# Patient Record
Sex: Female | Born: 1940 | Race: White | Hispanic: No | Smoking: Former smoker
Health system: Southern US, Community
[De-identification: ages and names within clinical notes are randomized; demographics above are authoritative.]

## PROBLEM LIST (undated history)

## (undated) DIAGNOSIS — I2510 Atherosclerotic heart disease of native coronary artery without angina pectoris: Secondary | ICD-10-CM

## (undated) DIAGNOSIS — I249 Acute ischemic heart disease, unspecified: Secondary | ICD-10-CM

## (undated) DIAGNOSIS — B029 Zoster without complications: Secondary | ICD-10-CM

## (undated) DIAGNOSIS — I255 Ischemic cardiomyopathy: Secondary | ICD-10-CM

## (undated) DIAGNOSIS — H269 Unspecified cataract: Secondary | ICD-10-CM

## (undated) DIAGNOSIS — C4490 Unspecified malignant neoplasm of skin, unspecified: Secondary | ICD-10-CM

## (undated) DIAGNOSIS — I10 Essential (primary) hypertension: Secondary | ICD-10-CM

## (undated) DIAGNOSIS — I219 Acute myocardial infarction, unspecified: Secondary | ICD-10-CM

## (undated) DIAGNOSIS — I739 Peripheral vascular disease, unspecified: Secondary | ICD-10-CM

## (undated) DIAGNOSIS — I4891 Unspecified atrial fibrillation: Secondary | ICD-10-CM

## (undated) DIAGNOSIS — E785 Hyperlipidemia, unspecified: Secondary | ICD-10-CM

## (undated) HISTORY — DX: Zoster without complications: B02.9

## (undated) HISTORY — PX: VAGINAL HYSTERECTOMY: SUR661

## (undated) HISTORY — PX: ABDOMINAL HYSTERECTOMY: SHX81

## (undated) HISTORY — DX: Essential (primary) hypertension: I10

## (undated) HISTORY — DX: Unspecified cataract: H26.9

## (undated) HISTORY — PX: CATARACT EXTRACTION, BILATERAL: SHX1313

## (undated) HISTORY — DX: Hyperlipidemia, unspecified: E78.5

## (undated) HISTORY — DX: Acute myocardial infarction, unspecified: I21.9

## (undated) HISTORY — DX: Unspecified malignant neoplasm of skin, unspecified: C44.90

---
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
## 1998-02-03 DIAGNOSIS — B029 Zoster without complications: Secondary | ICD-10-CM

## 1998-02-03 HISTORY — DX: Zoster without complications: B02.9

## 1998-06-07 ENCOUNTER — Ambulatory Visit (HOSPITAL_COMMUNITY): Admission: RE | Admit: 1998-06-07 | Discharge: 1998-06-07 | Payer: Self-pay | Admitting: Obstetrics & Gynecology

## 2003-09-18 ENCOUNTER — Encounter: Admission: RE | Admit: 2003-09-18 | Discharge: 2003-09-18 | Payer: Self-pay | Admitting: Sports Medicine

## 2003-10-31 ENCOUNTER — Ambulatory Visit: Payer: Self-pay | Admitting: Family Medicine

## 2003-11-02 ENCOUNTER — Ambulatory Visit: Payer: Self-pay | Admitting: Sports Medicine

## 2003-12-01 ENCOUNTER — Ambulatory Visit: Payer: Self-pay | Admitting: Sports Medicine

## 2003-12-11 ENCOUNTER — Encounter: Admission: RE | Admit: 2003-12-11 | Discharge: 2003-12-11 | Payer: Self-pay | Admitting: Sports Medicine

## 2004-01-16 ENCOUNTER — Ambulatory Visit: Payer: Self-pay | Admitting: Sports Medicine

## 2004-02-15 ENCOUNTER — Ambulatory Visit: Payer: Self-pay | Admitting: Family Medicine

## 2004-04-01 ENCOUNTER — Ambulatory Visit: Payer: Self-pay | Admitting: Sports Medicine

## 2004-07-11 ENCOUNTER — Ambulatory Visit: Payer: Self-pay | Admitting: Sports Medicine

## 2005-04-17 ENCOUNTER — Ambulatory Visit: Payer: Self-pay | Admitting: Family Medicine

## 2005-05-13 ENCOUNTER — Ambulatory Visit: Payer: Self-pay | Admitting: Sports Medicine

## 2005-05-26 ENCOUNTER — Encounter: Admission: RE | Admit: 2005-05-26 | Discharge: 2005-05-26 | Payer: Self-pay | Admitting: Sports Medicine

## 2005-06-23 ENCOUNTER — Encounter: Admission: RE | Admit: 2005-06-23 | Discharge: 2005-06-23 | Payer: Self-pay | Admitting: Family Medicine

## 2005-10-31 ENCOUNTER — Ambulatory Visit: Payer: Self-pay | Admitting: Family Medicine

## 2006-04-02 DIAGNOSIS — I10 Essential (primary) hypertension: Secondary | ICD-10-CM | POA: Insufficient documentation

## 2006-04-02 DIAGNOSIS — E669 Obesity, unspecified: Secondary | ICD-10-CM | POA: Insufficient documentation

## 2006-04-02 DIAGNOSIS — E785 Hyperlipidemia, unspecified: Secondary | ICD-10-CM | POA: Insufficient documentation

## 2006-05-12 ENCOUNTER — Telehealth (INDEPENDENT_AMBULATORY_CARE_PROVIDER_SITE_OTHER): Payer: Self-pay | Admitting: *Deleted

## 2006-07-24 ENCOUNTER — Encounter: Admission: RE | Admit: 2006-07-24 | Discharge: 2006-07-24 | Payer: Self-pay | Admitting: Sports Medicine

## 2006-07-24 ENCOUNTER — Encounter (INDEPENDENT_AMBULATORY_CARE_PROVIDER_SITE_OTHER): Payer: Self-pay | Admitting: Family Medicine

## 2006-08-28 ENCOUNTER — Encounter (INDEPENDENT_AMBULATORY_CARE_PROVIDER_SITE_OTHER): Payer: Self-pay | Admitting: Family Medicine

## 2006-08-28 ENCOUNTER — Ambulatory Visit: Payer: Self-pay | Admitting: Family Medicine

## 2006-08-28 LAB — CONVERTED CEMR LAB
ALT: 14 units/L (ref 0–35)
AST: 17 units/L (ref 0–37)
Albumin: 4.2 g/dL (ref 3.5–5.2)
Alkaline Phosphatase: 78 units/L (ref 39–117)
BUN: 22 mg/dL (ref 6–23)
Basophils Absolute: 0 10*3/uL (ref 0.0–0.1)
Basophils Relative: 1 % (ref 0–1)
CO2: 28 meq/L (ref 19–32)
Calcium: 9.5 mg/dL (ref 8.4–10.5)
Chloride: 101 meq/L (ref 96–112)
Creatinine, Ser: 0.79 mg/dL (ref 0.40–1.20)
Direct LDL: 139 mg/dL — ABNORMAL HIGH
Eosinophils Absolute: 0.1 10*3/uL (ref 0.0–0.7)
Eosinophils Relative: 2 % (ref 0–5)
Glucose, Bld: 82 mg/dL (ref 70–99)
HCT: 41.3 % (ref 36.0–46.0)
Hemoglobin: 12.9 g/dL (ref 12.0–15.0)
Lymphocytes Relative: 25 % (ref 12–46)
Lymphs Abs: 1.6 10*3/uL (ref 0.7–3.3)
MCHC: 31.2 g/dL (ref 30.0–36.0)
MCV: 93.2 fL (ref 78.0–100.0)
Monocytes Absolute: 0.6 10*3/uL (ref 0.2–0.7)
Monocytes Relative: 9 % (ref 3–11)
Neutro Abs: 4.2 10*3/uL (ref 1.7–7.7)
Neutrophils Relative %: 64 % (ref 43–77)
Platelets: 316 10*3/uL (ref 150–400)
Potassium: 4.5 meq/L (ref 3.5–5.3)
RBC: 4.43 M/uL (ref 3.87–5.11)
RDW: 14 % (ref 11.5–14.0)
Sodium: 140 meq/L (ref 135–145)
TSH: 1.174 microintl units/mL (ref 0.350–5.50)
Total Bilirubin: 0.5 mg/dL (ref 0.3–1.2)
Total Protein: 7.6 g/dL (ref 6.0–8.3)
WBC: 6.6 10*3/uL (ref 4.0–10.5)

## 2006-08-31 ENCOUNTER — Encounter (INDEPENDENT_AMBULATORY_CARE_PROVIDER_SITE_OTHER): Payer: Self-pay | Admitting: Family Medicine

## 2007-04-13 ENCOUNTER — Telehealth: Payer: Self-pay | Admitting: *Deleted

## 2007-04-20 ENCOUNTER — Telehealth: Payer: Self-pay | Admitting: *Deleted

## 2007-04-22 ENCOUNTER — Ambulatory Visit: Payer: Self-pay | Admitting: Family Medicine

## 2007-04-22 ENCOUNTER — Encounter: Payer: Self-pay | Admitting: Family Medicine

## 2007-06-09 ENCOUNTER — Ambulatory Visit: Payer: Self-pay | Admitting: Family Medicine

## 2007-06-11 ENCOUNTER — Encounter (INDEPENDENT_AMBULATORY_CARE_PROVIDER_SITE_OTHER): Payer: Self-pay | Admitting: Family Medicine

## 2007-06-12 ENCOUNTER — Encounter (INDEPENDENT_AMBULATORY_CARE_PROVIDER_SITE_OTHER): Payer: Self-pay | Admitting: Family Medicine

## 2007-07-08 ENCOUNTER — Ambulatory Visit: Payer: Self-pay | Admitting: Family Medicine

## 2007-07-08 DIAGNOSIS — M159 Polyosteoarthritis, unspecified: Secondary | ICD-10-CM | POA: Insufficient documentation

## 2007-07-16 ENCOUNTER — Ambulatory Visit: Payer: Self-pay | Admitting: Gastroenterology

## 2007-07-26 ENCOUNTER — Encounter: Admission: RE | Admit: 2007-07-26 | Discharge: 2007-07-26 | Payer: Self-pay | Admitting: Family Medicine

## 2007-08-10 ENCOUNTER — Ambulatory Visit: Payer: Self-pay | Admitting: Gastroenterology

## 2007-09-28 ENCOUNTER — Telehealth: Payer: Self-pay | Admitting: *Deleted

## 2007-09-29 ENCOUNTER — Ambulatory Visit: Payer: Self-pay | Admitting: Family Medicine

## 2007-12-21 ENCOUNTER — Ambulatory Visit: Payer: Self-pay | Admitting: Family Medicine

## 2007-12-22 ENCOUNTER — Encounter: Payer: Self-pay | Admitting: Family Medicine

## 2008-01-13 ENCOUNTER — Telehealth: Payer: Self-pay | Admitting: *Deleted

## 2008-08-17 ENCOUNTER — Encounter: Payer: Self-pay | Admitting: Family Medicine

## 2008-08-17 ENCOUNTER — Encounter: Admission: RE | Admit: 2008-08-17 | Discharge: 2008-08-17 | Payer: Self-pay | Admitting: Family Medicine

## 2008-09-13 ENCOUNTER — Encounter: Payer: Self-pay | Admitting: Family Medicine

## 2008-09-13 ENCOUNTER — Ambulatory Visit: Payer: Self-pay | Admitting: Family Medicine

## 2008-09-21 ENCOUNTER — Encounter: Payer: Self-pay | Admitting: Family Medicine

## 2008-09-22 LAB — CONVERTED CEMR LAB
ALT: 16 units/L (ref 0–35)
AST: 17 units/L (ref 0–37)
Albumin: 4.5 g/dL (ref 3.5–5.2)
Alkaline Phosphatase: 71 units/L (ref 39–117)
BUN: 16 mg/dL (ref 6–23)
CO2: 25 meq/L (ref 19–32)
Calcium: 9.5 mg/dL (ref 8.4–10.5)
Chloride: 104 meq/L (ref 96–112)
Cholesterol: 214 mg/dL — ABNORMAL HIGH (ref 0–200)
Creatinine, Ser: 0.84 mg/dL (ref 0.40–1.20)
Glucose, Bld: 93 mg/dL (ref 70–99)
HDL: 45 mg/dL (ref >39)
LDL Cholesterol: 148 mg/dL — ABNORMAL HIGH (ref 0–99)
Potassium: 4.3 meq/L (ref 3.5–5.3)
Sodium: 141 meq/L (ref 135–145)
Total Bilirubin: 0.5 mg/dL (ref 0.3–1.2)
Total CHOL/HDL Ratio: 4.8
Total Protein: 7.6 g/dL (ref 6.0–8.3)
Triglycerides: 104 mg/dL (ref <150)
VLDL: 21 mg/dL (ref 0–40)

## 2010-02-24 ENCOUNTER — Encounter: Payer: Self-pay | Admitting: Family Medicine

## 2010-03-06 ENCOUNTER — Encounter: Payer: Self-pay | Admitting: *Deleted

## 2010-09-05 ENCOUNTER — Ambulatory Visit (INDEPENDENT_AMBULATORY_CARE_PROVIDER_SITE_OTHER): Payer: PRIVATE HEALTH INSURANCE | Admitting: Family Medicine

## 2010-09-05 ENCOUNTER — Encounter: Payer: Self-pay | Admitting: Family Medicine

## 2010-09-05 VITALS — BP 154/88 | HR 63 | Temp 98.2°F | Ht 63.5 in | Wt 164.1 lb

## 2010-09-05 DIAGNOSIS — Z0000 Encounter for general adult medical examination without abnormal findings: Secondary | ICD-10-CM

## 2010-09-05 DIAGNOSIS — Z23 Encounter for immunization: Secondary | ICD-10-CM

## 2010-09-05 DIAGNOSIS — E785 Hyperlipidemia, unspecified: Secondary | ICD-10-CM

## 2010-09-05 DIAGNOSIS — I10 Essential (primary) hypertension: Secondary | ICD-10-CM

## 2010-09-05 LAB — COMPREHENSIVE METABOLIC PANEL
ALT: 13 U/L (ref 0–35)
AST: 18 U/L (ref 0–37)
Albumin: 4.2 g/dL (ref 3.5–5.2)
Alkaline Phosphatase: 66 U/L (ref 39–117)
BUN: 17 mg/dL (ref 6–23)
CO2: 27 mEq/L (ref 19–32)
Calcium: 9.4 mg/dL (ref 8.4–10.5)
Chloride: 104 mEq/L (ref 96–112)
Creat: 0.79 mg/dL (ref 0.50–1.10)
Glucose, Bld: 91 mg/dL (ref 70–99)
Potassium: 4.4 mEq/L (ref 3.5–5.3)
Sodium: 139 mEq/L (ref 135–145)
Total Bilirubin: 0.5 mg/dL (ref 0.3–1.2)
Total Protein: 7.6 g/dL (ref 6.0–8.3)

## 2010-09-05 LAB — LIPID PANEL
Cholesterol: 207 mg/dL — ABNORMAL HIGH (ref 0–200)
HDL: 41 mg/dL (ref >39)
LDL Cholesterol: 145 mg/dL — ABNORMAL HIGH (ref 0–99)
Total CHOL/HDL Ratio: 5 Ratio
Triglycerides: 107 mg/dL (ref <150)
VLDL: 21 mg/dL (ref 0–40)

## 2010-09-05 MED ORDER — ZOSTER VACCINE LIVE 19400 UNT/0.65ML ~~LOC~~ SOLR: 0.6500 mL | Freq: Once | SUBCUTANEOUS | Status: DC

## 2010-09-05 MED ORDER — PNEUMOCOCCAL VAC POLYVALENT 25 MCG/0.5ML IJ INJ
0.5000 mL | INJECTION | Freq: Once | INTRAMUSCULAR | Status: AC
  Administered 2010-09-05: 0.5 mL via INTRAMUSCULAR

## 2010-09-05 NOTE — Assessment & Plan Note (Signed)
Patient states that BP's at home are generally within range.  Has history of some very elevated BP's in the office in the past.  Patient does not want to take medications.  I asked her to return if blood pressures remain elevated at home, I gave her a handout on controlling BP without medications.

## 2010-09-05 NOTE — Progress Notes (Signed)
  Subjective:    Patient ID: Melissa Gibbs, female    DOB: [birth year], 70 y.o.   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 Wt Readings from Last 3 Encounters:  09/05/10 164 lb 1.6 oz (74.435 kg)  09/13/08 165 lb (74.844 kg)  12/21/07 167 lb (75.751 kg)   ROS:  Denies RUQ pain, myalgias, or symptoms or coronary ischemia Lab Results  Component Value Date   LDLCALC 148* 09/13/2008   Lab Results  Component Value Date   CHOL 214* 09/13/2008   Lab Results  Component Value Date   HDL 45 09/13/2008   Lab Results  Component Value Date   TRIG 104 09/13/2008   Lab Results  Component Value Date   ALT 16 09/13/2008   AST 17 09/13/2008   ALKPHOS 71 09/13/2008   BILITOT 0.5 09/13/2008   HYPERTENSION  BP Readings from Last 3 Encounters:  09/05/10 154/88  09/13/08 174/84  12/21/07 137/85    Hypertension ROS: taking medications as instructed, no medication side effects noted, home BP monitoring in range of 120-130's systolic over 70's's diastolic, no TIA's, no chest pain on exertion, no dyspnea on exertion, no swelling of ankles and no intermittent claudication symptoms.   She wants to discuss prevention such a dexa and xostavax   Gen:  No fever, chills, unexplained weight loss Ears:  No hearing loss, ringing Eyes: No vision changes, double vision, eye drainage Nose:  No rhinorrhea, congestion Throat:  No sore throat or dysphagia CV:  No chest pain, palpitations, PND, dyspnea on exertion, or edema Resp: No cough, dyspnea, wheezing Abd: No nausea, vomting, diarrhea, constipation, or change in bowel color, size, or caliber. MSK: no joint pain, myalgias SKIN: no rash, changing moles GU: No dysuria, hematuria, vaginal discharge    Review of Systems     Objective:   Physical Exam GEN: Alert & Oriented, No acute distress HEENT:  EOMI, PERRLA, no conjunctival injection or scleral  icterus.   Nares without edema or rhinorrhea.  Oropharynx is without erythema or exudates.  No anterior or posterior cervical lymphadenopathy.  No goiter. CV:  Regular Rate & Rhythm, no murmur Respiratory:  Normal work of breathing, CTAB Abd:  + BS, soft, no tenderness to palpation Ext: no pre-tibial edema         Assessment & Plan:

## 2010-09-05 NOTE — Assessment & Plan Note (Signed)
Screening up to date except for mammogram- she will schedule this on her own.  Got pneumovax today.  Given rx for zostavax.  Counseled does not need dexa as she states previously has normal screening and no risk factors.  Advised calcium +D and weight bearing exercises.  Advanced directive noted in social history.  DNI/DNR

## 2010-09-05 NOTE — Patient Instructions (Addendum)
If you notice your blood pressure is over 140/90, please let us know so we can discuss blood pressure. See tips on exercise and nutrition for blood pressure. Schedule mammogram Schedule dermatologist appointment for skin check See info on shingles vaccine Exercise and Calcium and Vitamin D for bone health You got you pneumonia vaccine today

## 2010-09-05 NOTE — Assessment & Plan Note (Signed)
Patient does not want to take statin.  She takes some fish oil.  She would like to check lipids and if very elevated may consider tx.

## 2010-09-06 ENCOUNTER — Encounter: Payer: Self-pay | Admitting: Family Medicine

## 2010-10-18 ENCOUNTER — Other Ambulatory Visit: Payer: Self-pay | Admitting: Family Medicine

## 2010-10-18 DIAGNOSIS — Z1231 Encounter for screening mammogram for malignant neoplasm of breast: Secondary | ICD-10-CM

## 2010-10-30 ENCOUNTER — Ambulatory Visit
Admission: RE | Admit: 2010-10-30 | Discharge: 2010-10-30 | Disposition: A | Discharge status: Home / Self Care | Payer: Medicare Other | Source: Ambulatory Visit | Attending: Family Medicine | Admitting: Family Medicine

## 2010-10-30 DIAGNOSIS — Z1231 Encounter for screening mammogram for malignant neoplasm of breast: Secondary | ICD-10-CM

## 2011-10-15 ENCOUNTER — Encounter: Payer: Self-pay | Admitting: Family Medicine

## 2011-10-15 ENCOUNTER — Ambulatory Visit (INDEPENDENT_AMBULATORY_CARE_PROVIDER_SITE_OTHER): Payer: Medicare Other | Admitting: Family Medicine

## 2011-10-15 VITALS — BP 151/88 | HR 81 | Temp 98.1°F | Ht 63.5 in | Wt 162.0 lb

## 2011-10-15 DIAGNOSIS — N390 Urinary tract infection, site not specified: Secondary | ICD-10-CM | POA: Insufficient documentation

## 2011-10-15 DIAGNOSIS — E785 Hyperlipidemia, unspecified: Secondary | ICD-10-CM

## 2011-10-15 DIAGNOSIS — R300 Dysuria: Secondary | ICD-10-CM

## 2011-10-15 LAB — POCT URINALYSIS DIPSTICK
Bilirubin, UA: NEGATIVE
Ketones, UA: NEGATIVE
Protein, UA: 100
Spec Grav, UA: 1.02
Urobilinogen, UA: 0.2
pH, UA: 5.5

## 2011-10-15 LAB — POCT UA - MICROSCOPIC ONLY

## 2011-10-15 MED ORDER — CEPHALEXIN 500 MG PO CAPS: 500.0000 mg | ORAL_CAPSULE | Freq: Two times a day (BID) | ORAL | Status: AC

## 2011-10-15 MED ORDER — PROBIOTIC PO CAPS: 1.0000 | ORAL_CAPSULE | Freq: Every day | ORAL | Status: DC

## 2011-10-15 NOTE — Patient Instructions (Signed)
You have a urinary tract infection -Drink plenty of water (4-6 cups daily) -Take the antibiotic for 7 days -Take the probiotic with the antibiotic or yogurt daily to prevent diarrhea

## 2011-10-15 NOTE — Assessment & Plan Note (Signed)
Symptoms and urinalysis consistent with UTI -Keflex x 7 days -She has a history of significant diarrhea with amoxicillin. Advised she eat yogurt and/or take probiotic to prevent diarrhea -Indications given to RTC

## 2011-10-15 NOTE — Progress Notes (Signed)
  Subjective:    Patient ID: Melissa Gibbs, female    DOB: 03-06-1940, 71 y.o.   MRN: [id number]  HPI # Dysuria for the past week She thinks she my have a UTI. Her last one was several years ago.  She is also have frequency and urgency and may be having some back pain.   She denies fevers/chills/nausea.  She is not sexually active.   Review of Systems Per HPI  Allergies, medication, past medical history reviewed.  Significant for: -Hyperlipidemia--she is not taking a statin. She is requesting your labs be checked today so that they will be ready when she comes back for her physical.  -HTN    Objective:   Physical Exam GEN: NAD; well-nourished, -appearing PSYCH: pleasant PULM: NI WOB BACK: no CVA tenderness ABD: NABS, soft, mild tenderness lower abdomen without guarding or rebound, ND    Assessment & Plan:

## 2011-10-22 ENCOUNTER — Other Ambulatory Visit: Payer: Medicare Other

## 2011-10-22 DIAGNOSIS — E785 Hyperlipidemia, unspecified: Secondary | ICD-10-CM

## 2011-10-22 LAB — LIPID PANEL
Cholesterol: 190 mg/dL (ref 0–200)
HDL: 45 mg/dL (ref >39)
Total CHOL/HDL Ratio: 4.2 Ratio
VLDL: 17 mg/dL (ref 0–40)

## 2011-10-22 NOTE — Progress Notes (Signed)
FLP DONE TODAY Gerome Kokesh 

## 2011-10-23 ENCOUNTER — Encounter: Payer: Self-pay | Admitting: Family Medicine

## 2011-10-30 ENCOUNTER — Encounter: Payer: Self-pay | Admitting: Family Medicine

## 2011-10-30 ENCOUNTER — Ambulatory Visit (INDEPENDENT_AMBULATORY_CARE_PROVIDER_SITE_OTHER): Payer: Medicare Other | Admitting: Family Medicine

## 2011-10-30 VITALS — BP 157/78 | HR 73 | Temp 98.1°F | Ht 63.5 in | Wt 163.0 lb

## 2011-10-30 DIAGNOSIS — E785 Hyperlipidemia, unspecified: Secondary | ICD-10-CM

## 2011-10-30 DIAGNOSIS — I10 Essential (primary) hypertension: Secondary | ICD-10-CM

## 2011-10-30 NOTE — Patient Instructions (Addendum)
Youre doing very well  Continue to work on getting some activity and increasing health fruits and veggies in your diet  Follow-up yearly  If you notice your blood pressure runs above 140/90 regularly, let me know

## 2011-10-30 NOTE — Progress Notes (Signed)
  Subjective:    Patient ID: Melissa Gibbs, female    DOB: 10/05/1940, 71 y.o.   MRN: [id number]  HPI  HYPERTENSION  BP Readings from Last 3 Encounters:  10/30/11 157/78  10/15/11 151/88  09/05/10 154/88    Hypertension ROS: taking medications as instructed, no medication side effects noted, home BP monitoring in range of 120-130's systolic over 70's diastolic, no TIA's, no chest pain on exertion, no dyspnea on exertion, no swelling of ankles and no intermittent claudication symptoms.   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 Wt Readings from Last 3 Encounters:  10/30/11 163 lb (73.936 kg)  10/15/11 162 lb (73.483 kg)  09/05/10 164 lb 1.6 oz (74.435 kg)   ROS:  Denies RUQ pain, myalgias, or symptoms or coronary ischemia Lab Results  Component Value Date   LDLCALC 128* 10/22/2011   Lab Results  Component Value Date   CHOL 190 10/22/2011   CHOL 207* 09/05/2010   CHOL 214* 09/13/2008   Lab Results  Component Value Date   HDL 45 10/22/2011   HDL 41 0/09/6576   HDL 45 09/13/2008   Lab Results  Component Value Date   TRIG 86 10/22/2011   TRIG 107 09/05/2010   TRIG 104 09/13/2008   Lab Results  Component Value Date   ALT 13 09/05/2010   AST 18 09/05/2010   ALKPHOS 66 09/05/2010   BILITOT 0.5 09/05/2010         Review of Systemssee HPI     Objective:   Physical ExamGEN: Alert & Oriented, No acute distress CV:  Regular Rate & Rhythm, no murmur Respiratory:  Normal work of breathing, CTAB Abd:  + BS, soft, no tenderness to palpation Ext: no pre-tibial edema         Assessment & Plan:

## 2011-10-30 NOTE — Assessment & Plan Note (Signed)
Well controlled by home readings, off meds.  Will continue to monitor, will let me know if readings become elevated.

## 2011-10-30 NOTE — Assessment & Plan Note (Signed)
LDL improved since last year, no meds.  Continue to work on lifestyle modificaiton

## 2012-02-04 HISTORY — PX: SKIN CANCER EXCISION: SHX779

## 2012-07-14 ENCOUNTER — Encounter: Payer: Self-pay | Admitting: Gastroenterology

## 2013-05-20 ENCOUNTER — Ambulatory Visit (INDEPENDENT_AMBULATORY_CARE_PROVIDER_SITE_OTHER): Payer: Commercial Managed Care - HMO | Admitting: Family Medicine

## 2013-05-20 ENCOUNTER — Encounter: Payer: Self-pay | Admitting: Family Medicine

## 2013-05-20 VITALS — BP 145/88 | HR 73 | Temp 98.5°F | Wt 167.0 lb

## 2013-05-20 DIAGNOSIS — E785 Hyperlipidemia, unspecified: Secondary | ICD-10-CM

## 2013-05-20 DIAGNOSIS — L821 Other seborrheic keratosis: Secondary | ICD-10-CM | POA: Insufficient documentation

## 2013-05-20 DIAGNOSIS — IMO0002 Reserved for concepts with insufficient information to code with codable children: Secondary | ICD-10-CM

## 2013-05-20 DIAGNOSIS — Z85828 Personal history of other malignant neoplasm of skin: Secondary | ICD-10-CM

## 2013-05-20 DIAGNOSIS — E663 Overweight: Secondary | ICD-10-CM

## 2013-05-20 DIAGNOSIS — IMO0001 Reserved for inherently not codable concepts without codable children: Secondary | ICD-10-CM

## 2013-05-20 DIAGNOSIS — N8111 Cystocele, midline: Secondary | ICD-10-CM

## 2013-05-20 DIAGNOSIS — I10 Essential (primary) hypertension: Secondary | ICD-10-CM

## 2013-05-20 DIAGNOSIS — R350 Frequency of micturition: Secondary | ICD-10-CM

## 2013-05-20 LAB — POCT URINALYSIS DIPSTICK
Bilirubin, UA: NEGATIVE
GLUCOSE UA: NEGATIVE
Ketones, UA: NEGATIVE
NITRITE UA: POSITIVE
Protein, UA: NEGATIVE
Spec Grav, UA: 1.025
UROBILINOGEN UA: 0.2
pH, UA: 5.5

## 2013-05-20 MED ORDER — ZOSTER VACCINE LIVE 19400 UNT/0.65ML ~~LOC~~ SOLR: 0.6500 mL | Freq: Once | SUBCUTANEOUS | Status: DC

## 2013-05-20 NOTE — Assessment & Plan Note (Signed)
discussed weight. Overweight better than underweight in elderly. Goal of 5 # weight loss in hopes of improving cystocele and symptoms.

## 2013-05-20 NOTE — Assessment & Plan Note (Signed)
Repeat lipid panel today. 

## 2013-05-20 NOTE — Assessment & Plan Note (Signed)
A: R UE SK x 2. Concerning to patient. P:  Referral to derm. Current derm, [facility], out of network and cost too much. Patient will determine in network derm and call us with preference.

## 2013-05-20 NOTE — Patient Instructions (Addendum)
Ms. Dobies,  Thank you for coming in today.  To remain healthy I do recommend regular exercise like walking for 20-30 minutes at least 4 days per week.   Please have repeat mammogram, colonoscopy [facility] at your earliest convenience. Please have zostavax.  Your bladder issue is related to your cystocele. A pouch that causes incomplete emptying.  For treatment: 1. Regular bathroom trips- try not to hold it.  2. Pessary-a ring that can be placed for gynecology to hold back cystocele. 3. Surgery.  Please read in formation below. If you are interested in seeing a gynecologist to discuss pessary or surgery I will be happy to refer you.  Plan to f/u in one year for repeat physical. I will be in touch with lab results.  Dr. Adrian Blackwater   Patient information: Pelvic organ prolapse (The Basics) View in Spanish Written by the doctors and editors at [employer] What is pelvic organ prolapse? - Pelvic organ prolapse is a condition that only affects women. It happens when tissues that support the organs in the lower belly relax. As a result, the organs drop down and press against or bulge into the vagina.  If the bladder bulges into the vagina, doctors call this problem "cystocele." If the rectum bulges into the vagina, they call it "rectocele." Uterine prolapse means the uterus has bulged into the vagina (figure 1).  What are the symptoms of pelvic organ prolapse? - Many women with this problem have no symptoms. But some women with pelvic organ prolapse have symptoms that include:  ?Fullness or pressure in the pelvis or vagina ?A bulge in the vagina or coming out of the vagina ?Leaking urine when they laugh, cough, or sneeze ?Needing to urinate all of a sudden When they use the toilet, some women need to press on the bulge in the vagina with a finger to get out all their urine or to finish a bowel movement.  Is there a test for pelvic organ prolapse? - Your doctor or nurse will be able to tell if  you have it by doing a pelvic exam.    Is there anything I can do on my own to feel better? - Yes. Some women feel better if they do pelvic muscle exercises. These exercises strengthen the muscles that control the flow of urine and bowel movements. They are also known as "Kegel" exercises. Your nurse or doctor can teach you how to do them.  How is pelvic organ prolapse treated? - Women who have no symptoms or who are not bothered by their symptoms do not need treatment. For women with symptoms that bother them, doctors suggest different treatments, including:  ?A vaginal pessary - This device fits inside a woman's vagina to support the bladder and push it back into place. Pessaries come in different shapes and sizes. ?Surgery - A surgeon can move dropped organs back where they belong and strengthen the tissues that keep them in place. Women should have this type of surgery only if they are done having children. Can pelvic organ prolapse be prevented? - You can reduce your chances of pelvic organ prolapse if you:  ?Lose weight if you are overweight ?Get treated for constipation if you are constipated ?Avoid activities that require you to lift heavy things

## 2013-05-20 NOTE — Progress Notes (Signed)
   Subjective:    Patient ID: Melissa Gibbs, female    DOB: [birth year], 73 y.o.   MRN: [id number] CC: f/u physical  HPI 73 yo F presents for f/u visit for yearly health maintenance physical and to discuss the following concerns:  1. Elevated BP: Elevated during office visit. Patient reports always elevated in the office. She denies vision change, headache, chest pain, shortness of breath. She takes hypertensive. She is a nonsmoker. She has a low-salt diet.  2. Bladder concerns: 2 years of frequent urination, hesitancy, pelvic pressure. Patient denies dysuria. Patient is not sexually active. Denies vaginal discharge. She is status post abdominal hysterectomy for uterine fibroids. Her symptoms do not interfere her quality of life. She prefers not to take medications.   3. Skin concerns: Patient is seen yearly by dermatology for total body skin surveillance. She was last seen 04/27/2013 at [facility] dermatology. She has a history of skin malignancy status post biopsy. There are no suspicious lesions on her last surveillance exam. She's brought a copy of the report. She did have multiple seborrheic keratosis on her extremities. She's concerned about 2 particular areas on her right arm which are growing in size and associated with pruritus.   4 Health Maintenance:  Due for zostavax, mammogram, colonoscopy.   Soc Hx: chronic non smoker. Lives alone. Fam Hx: melanoma in sister and nephew Review of Systems As per HPI Postive for easy bruising.  Negative for HA, vision change, CP, SOB, weight loss, diarrhea, constipation, weakness, hair loss or thinning.     Objective:   Physical Exam BP 162/100  Pulse 73  Temp(Src) 98.5 F (36.9 C) (Oral)  Wt 167 lb (75.751 kg) BP Readings from Last 3 Encounters:  05/20/13 145/88  10/30/11 157/78  10/15/11 151/88  General appearance: alert, cooperative, appears stated age and no distress Head: Normocephalic, without obvious abnormality, atraumatic Eyes:  conjunctivae/corneas clear. PERRL, EOM's intact. . Ears: normal TM's and external ear canals both ears Nose: no discharge, turbinates pale, swollen L>R Throat: lips, mucosa, and tongue normal; teeth and gums normal Neck: no adenopathy, no carotid bruit, no JVD, supple, symmetrical, trachea midline and thyroid not enlarged, symmetric, no tenderness/mass/nodules Back: symmetric, no curvature. ROM normal. No CVA tenderness. Lungs: clear to auscultation bilaterally Heart: regular rate and rhythm, S1, S2 normal, no murmur, click, rub or gallop Abdomen: soft, non-tender; bowel sounds normal; no masses,  no organomegaly Pelvic: positive findings: cystocele grade 2, mild urethral opening erythema, nontender, no lesions. External hemorrhoids 4-5 piles, non tender, speculum exam reveals absences cervix. Normal vaginal mucosa w/o atrophy.  Bimanual: uterus and cervix surgically absent. No adenxal mass or tenderness.  Extremities: extremities normal, atraumatic, no cyanosis or edema Pulses: 2+ and symmetric Skin: Skin color, texture, turgor normal. No rashes or lesions or multiple raised, stuck on macules on arms and legs. two areas of concern for patient on R arm: 7x9 mm and R posterior shoulder 15 x 7 mm both with stuck on appearance of SK. The R arm lesion is speckled with hyperpigmented spots.      Assessment & Plan:

## 2013-05-20 NOTE — Assessment & Plan Note (Signed)
A: initial BP elevated 162/100 repeat at goal for age. P: Check CMP and lipids Monitor q visit. Consider ambulatory BP monitoring

## 2013-05-20 NOTE — Assessment & Plan Note (Signed)
A: symptomatic cystocele P: Information provided Kegels, weight loss, avoid heavy lifting Patient will consider options and let me know if she would like gyn referral for pessary/surgery.

## 2013-05-21 LAB — COMPREHENSIVE METABOLIC PANEL
ALT: 15 U/L (ref 0–35)
AST: 19 U/L (ref 0–37)
Albumin: 4.4 g/dL (ref 3.5–5.2)
Alkaline Phosphatase: 78 U/L (ref 39–117)
BUN: 21 mg/dL (ref 6–23)
CO2: 27 mEq/L (ref 19–32)
Calcium: 9.6 mg/dL (ref 8.4–10.5)
Chloride: 102 mEq/L (ref 96–112)
Creat: 0.83 mg/dL (ref 0.50–1.10)
Glucose, Bld: 84 mg/dL (ref 70–99)
POTASSIUM: 4.1 meq/L (ref 3.5–5.3)
Sodium: 140 mEq/L (ref 135–145)
Total Bilirubin: 0.4 mg/dL (ref 0.2–1.2)
Total Protein: 7.8 g/dL (ref 6.0–8.3)

## 2013-05-21 LAB — LIPID PANEL
Cholesterol: 209 mg/dL — ABNORMAL HIGH (ref 0–200)
HDL: 46 mg/dL (ref >39)
LDL CALC: 144 mg/dL — AB (ref 0–99)
Total CHOL/HDL Ratio: 4.5 Ratio
Triglycerides: 95 mg/dL (ref <150)
VLDL: 19 mg/dL (ref 0–40)

## 2013-05-24 ENCOUNTER — Encounter: Payer: Self-pay | Admitting: Family Medicine

## 2013-06-01 ENCOUNTER — Telehealth: Payer: Self-pay | Admitting: Family Medicine

## 2013-06-01 NOTE — Telephone Encounter (Signed)
Needs referral to dermatologist. Dr Allyn Kenner is the one insurance recognizes Please advise

## 2013-06-03 ENCOUNTER — Other Ambulatory Visit: Payer: Self-pay

## 2013-06-03 DIAGNOSIS — Z1231 Encounter for screening mammogram for malignant neoplasm of breast: Secondary | ICD-10-CM

## 2013-06-06 NOTE — Telephone Encounter (Signed)
Called again about referral to dermatologist Is very concerned about spots on body

## 2013-06-06 NOTE — Telephone Encounter (Signed)
Referral placed.  Will forward to [place]. Melissa Gibbs,CMA

## 2013-06-13 ENCOUNTER — Ambulatory Visit
Admission: RE | Admit: 2013-06-13 | Discharge: 2013-06-13 | Disposition: A | Discharge status: Home / Self Care | Payer: Commercial Managed Care - HMO | Source: Ambulatory Visit

## 2013-06-13 ENCOUNTER — Telehealth: Payer: Self-pay | Admitting: Family Medicine

## 2013-06-13 DIAGNOSIS — Z1231 Encounter for screening mammogram for malignant neoplasm of breast: Secondary | ICD-10-CM

## 2013-06-13 NOTE — Telephone Encounter (Signed)
LVM to pt   It is not dermatology that accept [facility] in [place] I will like to know if pt can go to [facility] to see a dermatology.  Here is how this work : I fax the referral to [facility]. After a faxed the referral,  I will contact pt with [facility] and phone number. Pt needs to call to set up appt by her self.  If Pt can't go to [facility] Pt needs to go to [place]

## 2013-06-13 NOTE — Telephone Encounter (Signed)
LMV to pt PLEASE CHECK TELEPHONE NOTE PREVIEW TO THIS.   After checked pt INS card PCP is not correct pt needs to call [employer] and change it.  Unable to process referrals.  [place]

## 2013-06-16 ENCOUNTER — Telehealth: Payer: Self-pay | Admitting: Family Medicine

## 2013-06-16 NOTE — Telephone Encounter (Signed)
LVM to pt   [employer] Ins has incorrect PCP unable to process referral. It should be one of OUR provider(faculty)  [place]

## 2013-06-16 NOTE — Telephone Encounter (Signed)
Patient calls, she received her new [employer] card TODAY with Dr. Verlon Au name one it. Will bring card up today to be scanned into EPIC. Also, her insurance states that Dr. Marguerite Olea takes [facility]. She also called his office and his receptionist states that the insurance is accepted but she will need a referral.  I have printed and left Dr. Juel Burrow info on [place]' desk. Please refer her to this office.

## 2013-06-16 NOTE — Telephone Encounter (Signed)
Noted  

## 2013-06-16 NOTE — Telephone Encounter (Signed)
Just an FYI.  Pt will need to contact insurance to have this changed. Jaquanda Wickersham,CMA

## 2013-07-03 ENCOUNTER — Encounter: Payer: Self-pay | Admitting: Gastroenterology

## 2014-06-07 DIAGNOSIS — L720 Epidermal cyst: Secondary | ICD-10-CM | POA: Diagnosis not present

## 2014-06-07 DIAGNOSIS — L570 Actinic keratosis: Secondary | ICD-10-CM | POA: Diagnosis not present

## 2014-06-07 DIAGNOSIS — Z85828 Personal history of other malignant neoplasm of skin: Secondary | ICD-10-CM | POA: Diagnosis not present

## 2014-06-07 DIAGNOSIS — L821 Other seborrheic keratosis: Secondary | ICD-10-CM | POA: Diagnosis not present

## 2014-06-07 DIAGNOSIS — C4441 Basal cell carcinoma of skin of scalp and neck: Secondary | ICD-10-CM | POA: Diagnosis not present

## 2014-06-26 DIAGNOSIS — C4441 Basal cell carcinoma of skin of scalp and neck: Secondary | ICD-10-CM | POA: Diagnosis not present

## 2014-09-29 ENCOUNTER — Encounter: Payer: Self-pay | Admitting: Gastroenterology

## 2014-12-06 ENCOUNTER — Other Ambulatory Visit: Payer: Self-pay

## 2014-12-06 DIAGNOSIS — Z1231 Encounter for screening mammogram for malignant neoplasm of breast: Secondary | ICD-10-CM

## 2014-12-22 ENCOUNTER — Ambulatory Visit
Admission: RE | Admit: 2014-12-22 | Discharge: 2014-12-22 | Disposition: A | Discharge status: Home / Self Care | Payer: Medicare Other | Source: Ambulatory Visit

## 2014-12-22 DIAGNOSIS — Z1231 Encounter for screening mammogram for malignant neoplasm of breast: Secondary | ICD-10-CM

## 2015-03-30 ENCOUNTER — Telehealth: Payer: Self-pay | Admitting: Obstetrics and Gynecology

## 2015-03-30 NOTE — Telephone Encounter (Signed)
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 She does not know the exact date.

## 2015-06-14 DIAGNOSIS — Z85828 Personal history of other malignant neoplasm of skin: Secondary | ICD-10-CM | POA: Diagnosis not present

## 2015-06-14 DIAGNOSIS — L918 Other hypertrophic disorders of the skin: Secondary | ICD-10-CM | POA: Diagnosis not present

## 2015-06-14 DIAGNOSIS — L720 Epidermal cyst: Secondary | ICD-10-CM | POA: Diagnosis not present

## 2015-06-14 DIAGNOSIS — L304 Erythema intertrigo: Secondary | ICD-10-CM | POA: Diagnosis not present

## 2015-06-14 DIAGNOSIS — L821 Other seborrheic keratosis: Secondary | ICD-10-CM | POA: Diagnosis not present

## 2015-08-31 ENCOUNTER — Ambulatory Visit (INDEPENDENT_AMBULATORY_CARE_PROVIDER_SITE_OTHER): Payer: Medicare Other | Admitting: *Deleted

## 2015-08-31 ENCOUNTER — Encounter: Payer: Self-pay | Admitting: *Deleted

## 2015-08-31 VITALS — BP 160/88 | HR 73 | Ht 61.5 in | Wt 162.4 lb

## 2015-08-31 DIAGNOSIS — Z0000 Encounter for general adult medical examination without abnormal findings: Secondary | ICD-10-CM

## 2015-08-31 NOTE — Progress Notes (Signed)
Subjective:   Melissa Gibbs is a 75 y.o. female who presents for an Initial Medicare Annual Wellness Visit. She reports good health with the exception of "skin issues." She is followed by dermatology and reports having skin cancers removed. She has started having urine leakage in the past year and uses poise pads. She said she is not good about coming to the doctor and doesn't want to "focus on that" the way some of her peers do.  Since retiring from [employer], she works at the [employer], but would like to cut back her hours to make more time for other activities. She continues to work because she doesn't want to "sit at home and do nothing." She lives alone and has two dogs. Her daughter lives nearby.   Review of Systems     Cardiac Risk Factors include: obesity (BMI >30kg/m2)     Objective:    Today's Vitals   08/31/15 0855 08/31/15 0949  BP: (!) 171/89 (!) 160/88  Pulse: 73   SpO2: 93%   Weight: 162 lb 6.4 oz (73.7 kg)   Height: 5' 1.5" (1.562 m)    Body mass index is 30.19 kg/m.   Current Medications (verified) Outpatient Encounter Prescriptions as of 08/31/2015  Medication Sig  . zoster vaccine live, PF, (ZOSTAVAX) 91478 UNT/0.65ML injection Inject 19,400 Units into the skin once. (Patient not taking: Reported on 08/31/2015)   No facility-administered encounter medications on file as of 08/31/2015.   Melissa Gibbs reports taking multivitamins and fish oil occasionally, but not consistently.   Allergies (verified) Amoxicillin   History: Past Medical History:  Diagnosis Date  . Hyperlipidemia   . Hypertension   . Shingles 2000   R flank, mild, self reported, did not present to care    Past Surgical History:  Procedure Laterality Date  . ABDOMINAL HYSTERECTOMY     fibroids   . SKIN CANCER EXCISION  2014   [facility] Dermatology regularly   Family History  Problem Relation Age of Onset  . Heart disease Mother 4    MI , rheumatic fever as a child,  and heart murmur  . Cancer Father     knee cancer  . Colon cancer Father   . Hypertension Father   . Cancer Sister     melanoma - died at 62  . Melanoma Other     died at age 16  . Stroke Neg Hx    Social History   Occupational History  . 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 cafeteria worker [place] History Main Topics  . Smoking status: Former Smoker    Types: Cigarettes  . Smokeless tobacco: Never Used     Comment: quit smoking at age 3  . Alcohol use Yes     Comment: Very rarely, never by herself  . Drug use: No  . Sexual activity: No    Tobacco Counseling Counseling given: Not Answered Patient quit smoking at age 34  Activities of Daily Living In your present state of health, do you have any difficulty performing the following activities: 08/31/2015  Hearing? N  Vision? N  Difficulty concentrating or making decisions? N  Walking or climbing stairs? N  Dressing or bathing? N  Doing errands, shopping? N  Preparing Food and eating ? N  Using the Toilet? N  In the past six months, have you accidently leaked urine? Y  Do you have problems with loss  of bowel control? N  Managing your Medications? N  Managing your Finances? N  Housekeeping or managing your Housekeeping? N  Some recent data might be hidden    Immunizations and Health Maintenance Immunization History  Administered Date(s) Administered  . Influenza-Unspecified 11/03/2012, 11/04/2014  . Pneumococcal Polysaccharide-23 09/05/2010  . Td 11/08/2003   Health Maintenance Due  Topic Date Due  . ZOSTAVAX  01/23/2000  . DEXA SCAN  01/22/2005  . PNA vac Low Risk Adult (2 of 2 - PCV13) 09/05/2011  . COLONOSCOPY  08/09/2012  . TETANUS/TDAP  11/07/2013    Patient Care Team: Katheren Shams, DO as PCP - General (Family Medicine) Dr. Anastasio [facility] Dermatology  Indicate any recent Medical Services you may have received from other than [facility] providers in the past  year (date may be approximate).     Assessment:   This is a routine wellness examination for Melissa Gibbs.    Hearing/Vision screen  Hearing Screening   125Hz  250Hz  500Hz  1000Hz  2000Hz  3000Hz  4000Hz  6000Hz  8000Hz   Right ear:   20 20 20  20     Left ear:   20 20 20  20       Dietary issues and exercise activities discussed: Current Exercise Habits: The patient has a physically strenous job, but has no regular exercise apart from work., Exercise limited by: Other - see comments (patient is on her feet alot at work and works odd hours, she does not feel like exercising when she gets home)She would like to cut back on her hours at work to allow more time for other activities.  Goals    . Cut back hours at work (pt-stated)          Patient has asked to cut back her hours at work. She will talk to her boss about adjusting her schedule so she only works on weekends to allow more time for other activities like classes, walking and housekeeping.    . Take classes          Patient would like to take art classes at [employer] again    . Track BP at home again          Patient has high blood pressure at the doctor, but reports it is low when she checks it at home. She will change the batteries in her home machine and begin tracking it again.      Diet Melissa Gibbs reports avoiding salty foods, pork and other meats. She occasionally eats fish, but not often because she prefers it fried.  She reports reading nutritional magazines regularly, and avoids going out to eat because restaurant food tends to be high in sodium.  Melissa Gibbs reports having a messy home due to being tired from working odd hours. Her daughter will come visit and throw things away. She would like to keep her house up more once she cuts back her work hours.  Depression Screen PHQ 2/9 Scores 08/31/2015 05/20/2013  PHQ - 2 Score 0 0  Melissa Gibbs denies any symptoms of depression. She is sad her niece recently passed away at age 40 from liver  problems, and she was not aware she was sick.  Fall Risk Fall Risk  08/31/2015 05/20/2013  Falls in the past year? No No   TUG Test: Passed - 7 seconds  Cognitive Function: Mini-Cog: Passed 5/5   Screening Tests Health Maintenance  Topic Date Due  . ZOSTAVAX  01/23/2000  . DEXA SCAN  01/22/2005  . PNA  vac Low Risk Adult (2 of 2 - PCV13) 09/05/2011  . COLONOSCOPY  08/09/2012  . TETANUS/TDAP  11/07/2013  . INFLUENZA VACCINE  09/04/2015  Patient declines zoster vaccine. She would like to discuss the pneumonia vaccine and colon cancer screening with her PCP. She reports having had a Dexa Scan at a [facility] facility in 2009. She plans to get her flu shot again this year.    Plan:  Melissa Gibbs will return in 1-2 weeks to see Dr. Gerarda Fraction and recheck her blood pressure. She would also like to discuss Prevnar, Cologuard v. Colonoscopy with Dr. Gerarda Fraction at that time. She declined the Zostavax because she has had shingles in the past and does not think she needs it. She will bring her completed advanced directive with her to her next visit.  During the course of the visit, Melissa Gibbs was educated and counseled about the following appropriate screening and preventive services:   Vaccines to include Pneumoccal, Influenza, Td, Zostavax, HCV  Cardiovascular disease screening  Colorectal cancer screening  Blood pressure screening  Bone density screening - patient reports having had Dexa Scan in 2006   Diabetes screening  Patient Instructions (the written plan) were given to the patient.    Haugh, Adonis Brook   08/31/2015

## 2015-08-31 NOTE — Patient Instructions (Addendum)
Thank you for coming in today! Please come back in 1-2 weeks to see Dr. Gerarda Fraction and talk about your blood pressure. Be sure to change the batteries in your home blood pressure cuff and check it in the meantime. She can answer your questions about the prevnar pneumonia vaccine and Cologuard at that time.  I've given you the form for the Advanced Directive. You can bring the completed forms back when you come in a week or too. Rosa at our front desk can notarize it and put it in your medical record.  Please come back next year for another annual wellness visit.  [facility] Maintenance, Female Adopting a healthy lifestyle and getting preventive care can go a long way to promote health and wellness. Talk with your health care provider about what schedule of regular examinations is right for you. This is a good chance for you to check in with your provider about disease prevention and staying healthy. In between checkups, there are plenty of things you can do on your own. Experts have done a lot of research about which lifestyle changes and preventive measures are most likely to keep you healthy. Ask your health care provider for more information. WEIGHT AND DIET  Eat a healthy diet  Be sure to include plenty of vegetables, fruits, low-fat dairy products, and lean protein.  Do not eat a lot of foods high in solid fats, added sugars, or salt.  Get regular exercise. This is one of the most important things you can do for your health.  Most adults should exercise for at least 150 minutes each week. The exercise should increase your heart rate and make you sweat (moderate-intensity exercise).  Most adults should also do strengthening exercises at least twice a week. This is in addition to the moderate-intensity exercise.  Maintain a healthy weight  Body mass index (BMI) is a measurement that can be used to identify possible weight problems. It estimates body fat based on height and weight. Your  health care provider can help determine your BMI and help you achieve or maintain a healthy weight.  For females 76 years of age and older:   A BMI below 18.5 is considered underweight.  A BMI of 18.5 to 24.9 is normal.  A BMI of 25 to 29.9 is considered overweight.  A BMI of 30 and above is considered obese.  Watch levels of cholesterol and blood lipids  You should start having your blood tested for lipids and cholesterol at 76 years of age, then have this test every 5 years.  You may need to have your cholesterol levels checked more often if:  Your lipid or cholesterol levels are high.  You are older than 76 years of age.  You are at high risk for heart disease.  CANCER SCREENING   Lung Cancer  Lung cancer screening is recommended for adults 12-76 years old who are at high risk for lung cancer because of a history of smoking.  A yearly low-dose CT scan of the lungs is recommended for people who:  Currently smoke.  Have quit within the past 15 years.  Have at least a 30-pack-year history of smoking. A pack year is smoking an average of one pack of cigarettes a day for 1 year.  Yearly screening should continue until it has been 15 years since you quit.  Yearly screening should stop if you develop a health problem that would prevent you from having lung cancer treatment.  Breast Cancer  Practice breast self-awareness. This means understanding how your breasts normally appear and feel.  It also means doing regular breast self-exams. Let your health care provider know about any changes, no matter how small.  If you are in your 76s or 76s, you should have a clinical breast exam (CBE) by a health care provider every 1-3 years as part of a regular health exam.  If you are 76 or older, have a CBE every year. Also consider having a breast X-ray (mammogram) every year.  If you have a family history of breast cancer, talk to your health care provider about genetic  screening.  If you are at high risk for breast cancer, talk to your health care provider about having an MRI and a mammogram every year.  Breast cancer gene (BRCA) assessment is recommended for women who have family members with BRCA-related cancers. BRCA-related cancers include:  Breast.  Ovarian.  Tubal.  Peritoneal cancers.  Results of the assessment will determine the need for genetic counseling and BRCA1 and BRCA2 testing. Cervical Cancer Your health care provider may recommend that you be screened regularly for cancer of the pelvic organs (ovaries, uterus, and vagina). This screening involves a pelvic examination, including checking for microscopic changes to the surface of your cervix (Pap test). You may be encouraged to have this screening done every 3 years, beginning at age 65.  For women ages 39-65, health care providers may recommend pelvic exams and Pap testing every 3 years, or they may recommend the Pap and pelvic exam, combined with testing for human papilloma virus (HPV), every 5 years. Some types of HPV increase your risk of cervical cancer. Testing for HPV may also be done on women of any age with unclear Pap test results.  Other health care providers may not recommend any screening for nonpregnant women who are considered low risk for pelvic cancer and who do not have symptoms. Ask your health care provider if a screening pelvic exam is right for you.  If you have had past treatment for cervical cancer or a condition that could lead to cancer, you need Pap tests and screening for cancer for at least 20 years after your treatment. If Pap tests have been discontinued, your risk factors (such as having a new sexual partner) need to be reassessed to determine if screening should resume. Some women have medical problems that increase the chance of getting cervical cancer. In these cases, your health care provider may recommend more frequent screening and Pap tests. Colorectal  Cancer  This type of cancer can be detected and often prevented.  Routine colorectal cancer screening usually begins at 76 years of age and continues through 76 years of age.  Your health care provider may recommend screening at an earlier age if you have risk factors for colon cancer.  Your health care provider may also recommend using home test kits to check for hidden blood in the stool.  A small camera at the end of a tube can be used to examine your colon directly (sigmoidoscopy or colonoscopy). This is done to check for the earliest forms of colorectal cancer.  Routine screening usually begins at age 73.  Direct examination of the colon should be repeated every 5-10 years through 76 years of age. However, you may need to be screened more often if early forms of precancerous polyps or small growths are found. Skin Cancer  Check your skin from head to toe regularly.  Tell your health care provider about any  new moles or changes in moles, especially if there is a change in a mole's shape or color.  Also tell your health care provider if you have a mole that is larger than the size of a pencil eraser.  Always use sunscreen. Apply sunscreen liberally and repeatedly throughout the day.  Protect yourself by wearing long sleeves, pants, a wide-brimmed hat, and sunglasses whenever you are outside. HEART DISEASE, DIABETES, AND HIGH BLOOD PRESSURE   High blood pressure causes heart disease and increases the risk of stroke. High blood pressure is more likely to develop in:  People who have blood pressure in the high end of the normal range (130-139/85-89 mm Hg).  People who are overweight or obese.  People who are African American.  If you are 79-18 years of age, have your blood pressure checked every 3-5 years. If you are 25 years of age or older, have your blood pressure checked every year. You should have your blood pressure measured twice--once when you are at a hospital or clinic,  and once when you are not at a hospital or clinic. Record the average of the two measurements. To check your blood pressure when you are not at a hospital or clinic, you can use:  An automated blood pressure machine at a pharmacy.  A home blood pressure monitor.  If you are between 41 years and 31 years old, ask your health care provider if you should take aspirin to prevent strokes.  Have regular diabetes screenings. This involves taking a blood sample to check your fasting blood sugar level.  If you are at a normal weight and have a low risk for diabetes, have this test once every three years after 76 years of age.  If you are overweight and have a high risk for diabetes, consider being tested at a younger age or more often. PREVENTING INFECTION  Hepatitis B  If you have a higher risk for hepatitis B, you should be screened for this virus. You are considered at high risk for hepatitis B if:  You were born in a country where hepatitis B is common. Ask your health care provider which countries are considered high risk.  Your parents were born in a high-risk country, and you have not been immunized against hepatitis B (hepatitis B vaccine).  You have HIV or AIDS.  You use needles to inject street drugs.  You live with someone who has hepatitis B.  You have had sex with someone who has hepatitis B.  You get hemodialysis treatment.  You take certain medicines for conditions, including cancer, organ transplantation, and autoimmune conditions. Hepatitis C  Blood testing is recommended for:  Everyone born from [birth year] through 1966.  Anyone with known risk factors for hepatitis C. Sexually transmitted infections (STIs)  You should be screened for sexually transmitted infections (STIs) including gonorrhea and chlamydia if:  You are sexually active and are younger than 76 years of age.  You are older than 76 years of age and your health care provider tells you that you are at risk  for this type of infection.  Your sexual activity has changed since you were last screened and you are at an increased risk for chlamydia or gonorrhea. Ask your health care provider if you are at risk.  If you do not have HIV, but are at risk, it may be recommended that you take a prescription medicine daily to prevent HIV infection. This is called pre-exposure prophylaxis (PrEP). You are considered at risk  if:  You are sexually active and do not regularly use condoms or know the HIV status of your partner(s).  You take drugs by injection.  You are sexually active with a partner who has HIV. Talk with your health care provider about whether you are at high risk of being infected with HIV. If you choose to begin PrEP, you should first be tested for HIV. You should then be tested every 3 months for as long as you are taking PrEP.  PREGNANCY   If you are premenopausal and you may become pregnant, ask your health care provider about preconception counseling.  If you may become pregnant, take 400 to 800 micrograms (mcg) of folic acid every day.  If you want to prevent pregnancy, talk to your health care provider about birth control (contraception). OSTEOPOROSIS AND MENOPAUSE   Osteoporosis is a disease in which the bones lose minerals and strength with aging. This can result in serious bone fractures. Your risk for osteoporosis can be identified using a bone density scan.  If you are 27 years of age or older, or if you are at risk for osteoporosis and fractures, ask your health care provider if you should be screened.  Ask your health care provider whether you should take a calcium or vitamin D supplement to lower your risk for osteoporosis.  Menopause may have certain physical symptoms and risks.  Hormone replacement therapy may reduce some of these symptoms and risks. Talk to your health care provider about whether hormone replacement therapy is right for you.  HOME CARE INSTRUCTIONS    Schedule regular health, dental, and eye exams.  Stay current with your immunizations.   Do not use any tobacco products including cigarettes, chewing tobacco, or electronic cigarettes.  If you are pregnant, do not drink alcohol.  If you are breastfeeding, limit how much and how often you drink alcohol.  Limit alcohol intake to no more than 1 drink per day for nonpregnant women. One drink equals 12 ounces of beer, 5 ounces of wine, or 1 ounces of hard liquor.  Do not use street drugs.  Do not share needles.  Ask your health care provider for help if you need support or information about quitting drugs.  Tell your health care provider if you often feel depressed.  Tell your health care provider if you have ever been abused or do not feel safe at home.   This information is not intended to replace advice given to you by your health care provider. Make sure you discuss any questions you have with your health care provider.   Document Released: 08/05/2010 Document Revised: 02/10/2014 Document Reviewed: 12/22/2012 Elsevier Interactive Patient Education [employer].

## 2015-08-31 NOTE — Progress Notes (Signed)
I have reviewed this visit and discussed with [facility], and agree with her documentation.   Luiz Blare, DO 08/31/2015, 11:08 AM PGY-3, [place]

## 2015-11-01 ENCOUNTER — Encounter: Payer: Medicare Other | Admitting: Obstetrics and Gynecology

## 2015-11-07 ENCOUNTER — Encounter: Payer: Self-pay | Admitting: Obstetrics and Gynecology

## 2015-11-07 ENCOUNTER — Ambulatory Visit (INDEPENDENT_AMBULATORY_CARE_PROVIDER_SITE_OTHER): Payer: Medicare Other | Admitting: Obstetrics and Gynecology

## 2015-11-07 VITALS — BP 145/78 | HR 70 | Temp 97.8°F | Wt 161.2 lb

## 2015-11-07 DIAGNOSIS — E559 Vitamin D deficiency, unspecified: Secondary | ICD-10-CM

## 2015-11-07 DIAGNOSIS — I10 Essential (primary) hypertension: Secondary | ICD-10-CM

## 2015-11-07 DIAGNOSIS — E785 Hyperlipidemia, unspecified: Secondary | ICD-10-CM

## 2015-11-07 DIAGNOSIS — Z0000 Encounter for general adult medical examination without abnormal findings: Secondary | ICD-10-CM | POA: Diagnosis not present

## 2015-11-07 LAB — COMPLETE METABOLIC PANEL WITH GFR
ALT: 17 U/L (ref 6–29)
AST: 21 U/L (ref 10–35)
Albumin: 4.1 g/dL (ref 3.6–5.1)
Alkaline Phosphatase: 69 U/L (ref 33–130)
BUN: 16 mg/dL (ref 7–25)
CHLORIDE: 103 mmol/L (ref 98–110)
CO2: 26 mmol/L (ref 20–31)
Calcium: 9.7 mg/dL (ref 8.6–10.4)
Creat: 0.82 mg/dL (ref 0.60–0.93)
GFR, EST AFRICAN AMERICAN: 81 mL/min (ref >=60)
GFR, EST NON AFRICAN AMERICAN: 70 mL/min (ref >=60)
Glucose, Bld: 95 mg/dL (ref 65–99)
Potassium: 4.3 mmol/L (ref 3.5–5.3)
Sodium: 141 mmol/L (ref 135–146)
Total Bilirubin: 0.5 mg/dL (ref 0.2–1.2)
Total Protein: 7.1 g/dL (ref 6.1–8.1)

## 2015-11-07 LAB — LIPID PANEL
CHOLESTEROL: 218 mg/dL — AB (ref 125–200)
HDL: 48 mg/dL (ref >=46)
LDL Cholesterol: 148 mg/dL — ABNORMAL HIGH (ref <130)
TRIGLYCERIDES: 108 mg/dL (ref <150)
Total CHOL/HDL Ratio: 4.5 Ratio (ref <=5.0)
VLDL: 22 mg/dL (ref <30)

## 2015-11-07 NOTE — Patient Instructions (Signed)
Will contact you about flu shot.   Vitamin D and Calcium supplement for bone health. See below for amounts!!  Menopause is a normal process in which your reproductive ability comes to an end. This process happens gradually over a span of months to years, usually between the ages of 59 and 65. Menopause is complete when you have missed 12 consecutive menstrual periods. It is important to talk with your health care provider about some of the most common conditions that affect postmenopausal women, such as heart disease, cancer, and bone loss (osteoporosis). Adopting a healthy lifestyle and getting preventive care can help to promote your health and wellness. Those actions can also lower your chances of developing some of these common conditions. WHAT SHOULD I KNOW ABOUT MENOPAUSE? During menopause, you may experience a number of symptoms, such as:  Moderate-to-severe hot flashes.  Night sweats.  Decrease in sex drive.  Mood swings.  Headaches.  Tiredness.  Irritability.  Memory problems.  Insomnia. Choosing to treat or not to treat menopausal changes is an individual decision that you make with your health care provider. WHAT SHOULD I KNOW ABOUT HORMONE REPLACEMENT THERAPY AND SUPPLEMENTS? Hormone therapy products are effective for treating symptoms that are associated with menopause, such as hot flashes and night sweats. Hormone replacement carries certain risks, especially as you become older. If you are thinking about using estrogen or estrogen with progestin treatments, discuss the benefits and risks with your health care provider. WHAT SHOULD I KNOW ABOUT HEART DISEASE AND STROKE? Heart disease, heart attack, and stroke become more likely as you age. This may be due, in part, to the hormonal changes that your body experiences during menopause. These can affect how your body processes dietary fats, triglycerides, and cholesterol. Heart attack and stroke are both medical  emergencies. There are many things that you can do to help prevent heart disease and stroke:  Have your blood pressure checked at least every 1-2 years. High blood pressure causes heart disease and increases the risk of stroke.  If you are 27-60 years old, ask your health care provider if you should take aspirin to prevent a heart attack or a stroke.  Do not use any tobacco products, including cigarettes, chewing tobacco, or electronic cigarettes. If you need help quitting, ask your health care provider.  It is important to eat a healthy diet and maintain a healthy weight.  Be sure to include plenty of vegetables, fruits, low-fat dairy products, and lean protein.  Avoid eating foods that are high in solid fats, added sugars, or salt (sodium).  Get regular exercise. This is one of the most important things that you can do for your health.  Try to exercise for at least 150 minutes each week. The type of exercise that you do should increase your heart rate and make you sweat. This is known as moderate-intensity exercise.  Try to do strengthening exercises at least twice each week. Do these in addition to the moderate-intensity exercise.  Know your numbers.Ask your health care provider to check your cholesterol and your blood glucose. Continue to have your blood tested as directed by your health care provider. WHAT SHOULD I KNOW ABOUT CANCER SCREENING? There are several types of cancer. Take the following steps to reduce your risk and to catch any cancer development as early as possible. Breast Cancer  Practice breast self-awareness.  This means understanding how your breasts normally appear and feel.  It also means doing regular breast self-exams. Let your health  care provider know about any changes, no matter how small.  If you are 52 or older, have a clinician do a breast exam (clinical breast exam or CBE) every year. Depending on your age, family history, and medical history, it may  be recommended that you also have a yearly breast X-ray (mammogram).  If you have a family history of breast cancer, talk with your health care provider about genetic screening.  If you are at high risk for breast cancer, talk with your health care provider about having an MRI and a mammogram every year.  Breast cancer (BRCA) gene test is recommended for women who have family members with BRCA-related cancers. Results of the assessment will determine the need for genetic counseling and BRCA1 and for BRCA2 testing. BRCA-related cancers include these types:  Breast. This occurs in males or females.  Ovarian.  Tubal. This may also be called fallopian tube cancer.  Cancer of the abdominal or pelvic lining (peritoneal cancer).  Prostate.  Pancreatic. Cervical, Uterine, and Ovarian Cancer Your health care provider may recommend that you be screened regularly for cancer of the pelvic organs. These include your ovaries, uterus, and vagina. This screening involves a pelvic exam, which includes checking for microscopic changes to the surface of your cervix (Pap test).  For women ages 21-65, health care providers may recommend a pelvic exam and a Pap test every three years. For women ages 14-65, they may recommend the Pap test and pelvic exam, combined with testing for human papilloma virus (HPV), every five years. Some types of HPV increase your risk of cervical cancer. Testing for HPV may also be done on women of any age who have unclear Pap test results.  Other health care providers may not recommend any screening for nonpregnant women who are considered low risk for pelvic cancer and have no symptoms. Ask your health care provider if a screening pelvic exam is right for you.  If you have had past treatment for cervical cancer or a condition that could lead to cancer, you need Pap tests and screening for cancer for at least 20 years after your treatment. If Pap tests have been discontinued for you,  your risk factors (such as having a new sexual partner) need to be reassessed to determine if you should start having screenings again. Some women have medical problems that increase the chance of getting cervical cancer. In these cases, your health care provider may recommend that you have screening and Pap tests more often.  If you have a family history of uterine cancer or ovarian cancer, talk with your health care provider about genetic screening.  If you have vaginal bleeding after reaching menopause, tell your health care provider.  There are currently no reliable tests available to screen for ovarian cancer. Lung Cancer Lung cancer screening is recommended for adults 50-65 years old who are at high risk for lung cancer because of a history of smoking. A yearly low-dose CT scan of the lungs is recommended if you:  Currently smoke.  Have a history of at least 30 pack-years of smoking and you currently smoke or have quit within the past 15 years. A pack-year is smoking an average of one pack of cigarettes per day for one year. Yearly screening should:  Continue until it has been 15 years since you quit.  Stop if you develop a health problem that would prevent you from having lung cancer treatment. Colorectal Cancer  This type of cancer can be detected and can  often be prevented.  Routine colorectal cancer screening usually begins at age 78 and continues through age 46.  If you have risk factors for colon cancer, your health care provider may recommend that you be screened at an earlier age.  If you have a family history of colorectal cancer, talk with your health care provider about genetic screening.  Your health care provider may also recommend using home test kits to check for hidden blood in your stool.  A small camera at the end of a tube can be used to examine your colon directly (sigmoidoscopy or colonoscopy). This is done to check for the earliest forms of colorectal  cancer.  Direct examination of the colon should be repeated every 5-10 years until age 22. However, if early forms of precancerous polyps or small growths are found or if you have a family history or genetic risk for colorectal cancer, you may need to be screened more often. Skin Cancer  Check your skin from head to toe regularly.  Monitor any moles. Be sure to tell your health care provider:  About any new moles or changes in moles, especially if there is a change in a mole's shape or color.  If you have a mole that is larger than the size of a pencil eraser.  If any of your family members has a history of skin cancer, especially at a young age, talk with your health care provider about genetic screening.  Always use sunscreen. Apply sunscreen liberally and repeatedly throughout the day.  Whenever you are outside, protect yourself by wearing long sleeves, pants, a wide-brimmed hat, and sunglasses. WHAT SHOULD I KNOW ABOUT OSTEOPOROSIS? Osteoporosis is a condition in which bone destruction happens more quickly than new bone creation. After menopause, you may be at an increased risk for osteoporosis. To help prevent osteoporosis or the bone fractures that can happen because of osteoporosis, the following is recommended:  If you are 58-49 years old, get at least 1,000 mg of calcium and at least 600 mg of vitamin D per day.  If you are older than age 13 but younger than age 2, get at least 1,200 mg of calcium and at least 600 mg of vitamin D per day.  If you are older than age 32, get at least 1,200 mg of calcium and at least 800 mg of vitamin D per day. Smoking and excessive alcohol intake increase the risk of osteoporosis. Eat foods that are rich in calcium and vitamin D, and do weight-bearing exercises several times each week as directed by your health care provider. WHAT SHOULD I KNOW ABOUT HOW MENOPAUSE AFFECTS [place]? Depression may occur at any age, but it is more common as  you become older. Common symptoms of depression include:  Low or sad mood.  Changes in sleep patterns.  Changes in appetite or eating patterns.  Feeling an overall lack of motivation or enjoyment of activities that you previously enjoyed.  Frequent crying spells. Talk with your health care provider if you think that you are experiencing depression. WHAT SHOULD I KNOW ABOUT IMMUNIZATIONS? It is important that you get and maintain your immunizations. These include:  Tetanus, diphtheria, and pertussis (Tdap) booster vaccine.  Influenza every year before the flu season begins.  Pneumonia vaccine.  Shingles vaccine. Your health care provider may also recommend other immunizations.   This information is not intended to replace advice given to you by your health care provider. Make sure you discuss any questions you have  with your health care provider.   Document Released: 03/14/2005 Document Revised: 02/10/2014 Document Reviewed: 09/22/2013 Elsevier Interactive Patient Education [employer].

## 2015-11-07 NOTE — Progress Notes (Signed)
75 y.o. year old female presents for well woman/preventative visit and annual examination.  Acute Concerns:  #Hypertension Not on medications Has been recording values for the last two years and all <160/80 ROS: Denies headache, dizziness, visual changes, nausea, vomiting, chest pain, abdominal pain or shortness of breath.  #HLD Not on medication Takes an occasional fish oil  Diet: could improve diet, does not eat red meat or pork. Not a big meat eater. Carbs is her crutch. Likes sweets.  Exercise: None. Knows she should try and exercise.   Sexual History: Not in a relationship  LMP: s/p hysterectomy  POA/Living Will: Has advance directive but did not bring it in today   Social:  Social History   Social History  . Marital status: Divorced    Spouse name: N/A  . Number of children: N/A  . Years of education: N/A   Occupational History  . 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 cafeteria worker [employer] History Main Topics  . Smoking status: Former Smoker    Types: Cigarettes  . Smokeless tobacco: Never Used     Comment: quit smoking at age 9  . Alcohol use Yes     Comment: Very rarely, never by herself  . Drug use: No  . Sexual activity: No   Other Topics Concern  . Not on file   Social History Narrative   Divorced, lives alone.  One adult daughter, [name].  DNR/DNI.    Feels strongly when it is her time she would like to pass naturally, her daughter is CNA for elderly person.        Likes to work in the yard when its not so hot out. Works a lot in [employer] at [employer].   Immunization History  Administered Date(s) Administered  . Influenza-Unspecified 11/03/2012, 11/04/2014  . Pneumococcal Polysaccharide-23 09/05/2010  . Td 11/08/2003    Health Maintenance Due  Topic Date Due  . ZOSTAVAX  01/23/2000  . DEXA SCAN  01/22/2005  . PNA vac Low Risk Adult (2 of 2 - PCV13) 09/05/2011  . COLONOSCOPY  08/09/2012  . TETANUS/TDAP  11/07/2013   . INFLUENZA VACCINE  09/04/2015    ROS reviewed and were negative unless otherwise noted in HPI.   Physical Exam: Blood pressure (!) 145/78, pulse 70, temperature 97.8 F (36.6 C), temperature source Oral, weight 161 lb 3.2 oz (73.1 kg), SpO2 99 %.  General: alert, well-developed, NAD, cooperative HEENT: NCAT, vision grossly intact, PERRLA, no injection and anicteric. EOMI. MMM Neck: supple, full ROM, no thyromegaly. No deformities, masses, or tenderness noted.  Lungs: CTAB, normal respiratory effort, no crackles, and no wheezes. Heart: RRR, no M/R/G. Pulses intact. Abdomen: Bowel sounds normal; abdomen soft and nontender. No masses, organomegaly or hernias noted. no guarding, no rebound tenderness Extremities: No cyanosis, clubbing, edema Neurologic: No focal deficits, CN grossly intact,+5 strength globally, sensation grossly intact, gait normal, A&Ox3.  Skin: Intact without suspicious lesions or rashes. Warm and dry. Spider and varicose veins of legs Psych: Mood and affect are normal; no evidence of anxiety or depression.  ASSESSMENT & PLAN: 75 y.o. female presents for annual well woman/preventative exam. Please see problem specific assessment and plan.    Orders Placed This Encounter  Procedures  . VITAMIN D 25 Hydroxy (Vit-D Deficiency, Fractures)  . COMPLETE METABOLIC PANEL WITH GFR  . Lipid panel     Luiz Blare, DO 11/07/2015, 7:18 AM PGY-3, [place]

## 2015-11-07 NOTE — Assessment & Plan Note (Signed)
A: Well-controlled. Not currently on medications. At goal for age <160/90. P: Continue to monitor with intermittent home readings. Will collect CMP today.

## 2015-11-07 NOTE — Assessment & Plan Note (Signed)
A: healthy elderly female. P: Declined vaccinations today after counseling. Also declined DEXA scanning. Will collect Vit. D level. Handout given.

## 2015-11-07 NOTE — Assessment & Plan Note (Signed)
A: Uncontrolled. Not currently on therapy. Last lipid panel in 2015 with total cholesterol >200.  P: Repeat lipid panel today. Continue fish oil. Will consider additional therapy based off lab results.

## 2015-11-08 LAB — VITAMIN D 25 HYDROXY (VIT D DEFICIENCY, FRACTURES): Vit D, 25-Hydroxy: 15 ng/mL — ABNORMAL LOW (ref 30–100)

## 2015-11-09 ENCOUNTER — Encounter: Payer: Self-pay | Admitting: Obstetrics and Gynecology

## 2015-11-09 ENCOUNTER — Other Ambulatory Visit: Payer: Self-pay | Admitting: Obstetrics and Gynecology

## 2015-11-09 MED ORDER — VITAMIN D (ERGOCALCIFEROL) 1.25 MG (50000 UNIT) PO CAPS: 50000.0000 [IU] | ORAL_CAPSULE | ORAL | 0 refills | Status: DC

## 2016-07-01 DIAGNOSIS — Z85828 Personal history of other malignant neoplasm of skin: Secondary | ICD-10-CM | POA: Diagnosis not present

## 2016-07-01 DIAGNOSIS — L700 Acne vulgaris: Secondary | ICD-10-CM | POA: Diagnosis not present

## 2016-07-01 DIAGNOSIS — B078 Other viral warts: Secondary | ICD-10-CM | POA: Diagnosis not present

## 2016-07-01 DIAGNOSIS — L821 Other seborrheic keratosis: Secondary | ICD-10-CM | POA: Diagnosis not present

## 2016-10-14 DIAGNOSIS — H524 Presbyopia: Secondary | ICD-10-CM | POA: Diagnosis not present

## 2016-10-14 DIAGNOSIS — H25813 Combined forms of age-related cataract, bilateral: Secondary | ICD-10-CM | POA: Diagnosis not present

## 2016-10-24 ENCOUNTER — Telehealth: Payer: Self-pay | Admitting: *Deleted

## 2016-10-24 NOTE — Telephone Encounter (Signed)
Pt called and LM on nurse line.  She has "a few questions" no other info left.    Attempted to call back.  No answer and No machine.  Will await callback. [place], Salome Spotted, CMA

## 2016-11-13 DIAGNOSIS — H2513 Age-related nuclear cataract, bilateral: Secondary | ICD-10-CM | POA: Diagnosis not present

## 2016-12-15 DIAGNOSIS — H25812 Combined forms of age-related cataract, left eye: Secondary | ICD-10-CM | POA: Diagnosis not present

## 2016-12-15 DIAGNOSIS — H2512 Age-related nuclear cataract, left eye: Secondary | ICD-10-CM | POA: Diagnosis not present

## 2016-12-23 ENCOUNTER — Ambulatory Visit (INDEPENDENT_AMBULATORY_CARE_PROVIDER_SITE_OTHER): Payer: Medicare Other | Admitting: Family Medicine

## 2016-12-23 ENCOUNTER — Other Ambulatory Visit: Payer: Self-pay

## 2016-12-23 ENCOUNTER — Telehealth: Payer: Self-pay | Admitting: *Deleted

## 2016-12-23 ENCOUNTER — Encounter: Payer: Self-pay | Admitting: Family Medicine

## 2016-12-23 VITALS — BP 138/78 | HR 97 | Temp 98.0°F | Ht 61.0 in | Wt 159.0 lb

## 2016-12-23 DIAGNOSIS — Z0000 Encounter for general adult medical examination without abnormal findings: Secondary | ICD-10-CM

## 2016-12-23 DIAGNOSIS — E7800 Pure hypercholesterolemia, unspecified: Secondary | ICD-10-CM | POA: Diagnosis not present

## 2016-12-23 DIAGNOSIS — Z23 Encounter for immunization: Secondary | ICD-10-CM

## 2016-12-23 MED ORDER — ROSUVASTATIN CALCIUM 20 MG PO TABS: 20.0000 mg | ORAL_TABLET | Freq: Every day | ORAL | 11 refills | Status: DC

## 2016-12-23 MED ORDER — TETANUS-DIPHTH-ACELL PERTUSSIS 5-2.5-18.5 LF-MCG/0.5 IM SUSP: 0.5000 mL | Freq: Once | INTRAMUSCULAR | 0 refills | Status: AC

## 2016-12-23 NOTE — Telephone Encounter (Signed)
Received fax from [place] requesting prior authorization of Rosuvastatin . Form downloaded and placed in MD's box for completion. Left message on patient's VM requesting return call. She will need to call her insurance company to find out which med is on formulary.  Velora Heckler, RN

## 2016-12-23 NOTE — Progress Notes (Signed)
Date of Visit: 12/23/2016   HPI: Melissa Gibbs is a 76 year old female here for her annual physical. She has no complaints at this time. She reports that she did experience a fall of Nov 4 during which she was experiencing a "head cold", woke up at night, felt dizzy, and fell. She did not lose consciousness or hit her head and denies any significant trauma. She also is getting cataract surgery next week. Patient denies fever, chills, nausea, vomiting, diarrhea, syncope, or palpitations.   ROS: See HPI.  [place]: Patient reports that her sister has recently died of lung cancer. Patient reports smoking in her twenties but has not smoked in over 45 years.   PHYSICAL EXAM: BP 138/78 (BP Location: Right Arm, Patient Position: Sitting, Cuff Size: Normal)   Pulse 97   Temp 98 F (36.7 C) (Oral)   Ht 5\' 1"  (1.549 m)   Wt 159 lb (72.1 kg)   SpO2 96%   BMI 30.04 kg/m  Gen: Well appearing female, NAD HEENT: Normocephalic, pupils equal and reactive, no cervical lymphadenopathy, tympanic membranes white and non bulging.  Heart: RRR, no murmurs, rubs, or gallops. Lungs: CTAB, no wheezes, crackles, or rhonchi. Neuro: No focal deficits, strength symmetric.  Ext: No peripheral edema.  ASSESSMENT/PLAN:  Melissa Gibbs is a 76 year old female here for her annual check up and is in overall good health.    # Hyperlipidemia: Will reorder lipid panel as last year's values were high. Patient's ASCVD calculated risk 20%.  - Follow up with lipid panel, LFTs, and CMP - Begin Rosuvastatin 20 mg high intensity statin therapy   Health maintenance:  - Patient received prevnar 13 today - Patient has received flu shot 11/03/16 - Printed Tdap script to take to pharmacy.    FOLLOW UP: Follow up PRN  Dot Lanes, [place]

## 2016-12-23 NOTE — Patient Instructions (Signed)
It was great seeing you today!  Please call me if you have any difficulty getting the cholesterol medication or tolerating it.  I'm happy to see you back more frequently but please do not go longer than 1 year between visits.   If you have questions or concerns please do not hesitate to call at [phone].  Lucila Maine, DO PGY-2, [facility] 12/23/2016 2:21 PM    Health Maintenance for Postmenopausal Women Menopause is a normal process in which your reproductive ability comes to an end. This process happens gradually over a span of months to years, usually between the ages of 38 and 55. Menopause is complete when you have missed 12 consecutive menstrual periods. It is important to talk with your health care provider about some of the most common conditions that affect postmenopausal women, such as heart disease, cancer, and bone loss (osteoporosis). Adopting a healthy lifestyle and getting preventive care can help to promote your health and wellness. Those actions can also lower your chances of developing some of these common conditions. What should I know about menopause? During menopause, you may experience a number of symptoms, such as:  Moderate-to-severe hot flashes.  Night sweats.  Decrease in sex drive.  Mood swings.  Headaches.  Tiredness.  Irritability.  Memory problems.  Insomnia.  Choosing to treat or not to treat menopausal changes is an individual decision that you make with your health care provider. What should I know about hormone replacement therapy and supplements? Hormone therapy products are effective for treating symptoms that are associated with menopause, such as hot flashes and night sweats. Hormone replacement carries certain risks, especially as you become older. If you are thinking about using estrogen or estrogen with progestin treatments, discuss the benefits and risks with your health care provider. What should I know  about heart disease and stroke? Heart disease, heart attack, and stroke become more likely as you age. This may be due, in part, to the hormonal changes that your body experiences during menopause. These can affect how your body processes dietary fats, triglycerides, and cholesterol. Heart attack and stroke are both medical emergencies. There are many things that you can do to help prevent heart disease and stroke:  Have your blood pressure checked at least every 1-2 years. High blood pressure causes heart disease and increases the risk of stroke.  If you are 45-44 years old, ask your health care provider if you should take aspirin to prevent a heart attack or a stroke.  Do not use any tobacco products, including cigarettes, chewing tobacco, or electronic cigarettes. If you need help quitting, ask your health care provider.  It is important to eat a healthy diet and maintain a healthy weight. ? Be sure to include plenty of vegetables, fruits, low-fat dairy products, and lean protein. ? Avoid eating foods that are high in solid fats, added sugars, or salt (sodium).  Get regular exercise. This is one of the most important things that you can do for your health. ? Try to exercise for at least 150 minutes each week. The type of exercise that you do should increase your heart rate and make you sweat. This is known as moderate-intensity exercise. ? Try to do strengthening exercises at least twice each week. Do these in addition to the moderate-intensity exercise.  Know your numbers.Ask your health care provider to check your cholesterol and your blood glucose. Continue to have your blood tested as directed by your health care provider.  What should I know about cancer screening? There are several types of cancer. Take the following steps to reduce your risk and to catch any cancer development as early as possible. Breast Cancer  Practice breast self-awareness. ? This means understanding how your  breasts normally appear and feel. ? It also means doing regular breast self-exams. Let your health care provider know about any changes, no matter how small.  If you are 98 or older, have a clinician do a breast exam (clinical breast exam or CBE) every year. Depending on your age, family history, and medical history, it may be recommended that you also have a yearly breast X-ray (mammogram).  If you have a family history of breast cancer, talk with your health care provider about genetic screening.  If you are at high risk for breast cancer, talk with your health care provider about having an MRI and a mammogram every year.  Breast cancer (BRCA) gene test is recommended for women who have family members with BRCA-related cancers. Results of the assessment will determine the need for genetic counseling and BRCA1 and for BRCA2 testing. BRCA-related cancers include these types: ? Breast. This occurs in males or females. ? Ovarian. ? Tubal. This may also be called fallopian tube cancer. ? Cancer of the abdominal or pelvic lining (peritoneal cancer). ? Prostate. ? Pancreatic.  Cervical, Uterine, and Ovarian Cancer Your health care provider may recommend that you be screened regularly for cancer of the pelvic organs. These include your ovaries, uterus, and vagina. This screening involves a pelvic exam, which includes checking for microscopic changes to the surface of your cervix (Pap test).  For women ages 21-65, health care providers may recommend a pelvic exam and a Pap test every three years. For women ages 52-65, they may recommend the Pap test and pelvic exam, combined with testing for human papilloma virus (HPV), every five years. Some types of HPV increase your risk of cervical cancer. Testing for HPV may also be done on women of any age who have unclear Pap test results.  Other health care providers may not recommend any screening for nonpregnant women who are considered low risk for pelvic  cancer and have no symptoms. Ask your health care provider if a screening pelvic exam is right for you.  If you have had past treatment for cervical cancer or a condition that could lead to cancer, you need Pap tests and screening for cancer for at least 20 years after your treatment. If Pap tests have been discontinued for you, your risk factors (such as having a new sexual partner) need to be reassessed to determine if you should start having screenings again. Some women have medical problems that increase the chance of getting cervical cancer. In these cases, your health care provider may recommend that you have screening and Pap tests more often.  If you have a family history of uterine cancer or ovarian cancer, talk with your health care provider about genetic screening.  If you have vaginal bleeding after reaching menopause, tell your health care provider.  There are currently no reliable tests available to screen for ovarian cancer.  Lung Cancer Lung cancer screening is recommended for adults 32-56 years old who are at high risk for lung cancer because of a history of smoking. A yearly low-dose CT scan of the lungs is recommended if you:  Currently smoke.  Have a history of at least 30 pack-years of smoking and you currently smoke or have quit within the past  15 years. A pack-year is smoking an average of one pack of cigarettes per day for one year.  Yearly screening should:  Continue until it has been 15 years since you quit.  Stop if you develop a health problem that would prevent you from having lung cancer treatment.  Colorectal Cancer  This type of cancer can be detected and can often be prevented.  Routine colorectal cancer screening usually begins at age 9 and continues through age 91.  If you have risk factors for colon cancer, your health care provider may recommend that you be screened at an earlier age.  If you have a family history of colorectal cancer, talk with  your health care provider about genetic screening.  Your health care provider may also recommend using home test kits to check for hidden blood in your stool.  A small camera at the end of a tube can be used to examine your colon directly (sigmoidoscopy or colonoscopy). This is done to check for the earliest forms of colorectal cancer.  Direct examination of the colon should be repeated every 5-10 years until age 19. However, if early forms of precancerous polyps or small growths are found or if you have a family history or genetic risk for colorectal cancer, you may need to be screened more often.  Skin Cancer  Check your skin from head to toe regularly.  Monitor any moles. Be sure to tell your health care provider: ? About any new moles or changes in moles, especially if there is a change in a mole's shape or color. ? If you have a mole that is larger than the size of a pencil eraser.  If any of your family members has a history of skin cancer, especially at a young age, talk with your health care provider about genetic screening.  Always use sunscreen. Apply sunscreen liberally and repeatedly throughout the day.  Whenever you are outside, protect yourself by wearing long sleeves, pants, a wide-brimmed hat, and sunglasses.  What should I know about osteoporosis? Osteoporosis is a condition in which bone destruction happens more quickly than new bone creation. After menopause, you may be at an increased risk for osteoporosis. To help prevent osteoporosis or the bone fractures that can happen because of osteoporosis, the following is recommended:  If you are 66-54 years old, get at least 1,000 mg of calcium and at least 600 mg of vitamin D per day.  If you are older than age 59 but younger than age 81, get at least 1,200 mg of calcium and at least 600 mg of vitamin D per day.  If you are older than age 89, get at least 1,200 mg of calcium and at least 800 mg of vitamin D per  day.  Smoking and excessive alcohol intake increase the risk of osteoporosis. Eat foods that are rich in calcium and vitamin D, and do weight-bearing exercises several times each week as directed by your health care provider. What should I know about how menopause affects my mental health? Depression may occur at any age, but it is more common as you become older. Common symptoms of depression include:  Low or sad mood.  Changes in sleep patterns.  Changes in appetite or eating patterns.  Feeling an overall lack of motivation or enjoyment of activities that you previously enjoyed.  Frequent crying spells.  Talk with your health care provider if you think that you are experiencing depression. What should I know about immunizations? It is important  that you get and maintain your immunizations. These include:  Tetanus, diphtheria, and pertussis (Tdap) booster vaccine.  Influenza every year before the flu season begins.  Pneumonia vaccine.  Shingles vaccine.  Your health care provider may also recommend other immunizations. This information is not intended to replace advice given to you by your health care provider. Make sure you discuss any questions you have with your health care provider. Document Released: 03/14/2005 Document Revised: 08/10/2015 Document Reviewed: 10/24/2014 Elsevier Interactive Patient Education  2018 [employer]  

## 2016-12-24 MED FILL — ROSUVASTATIN CALCIUM 20 MG: 20 | 30 days supply | Qty: 30 | Fill #0

## 2016-12-29 NOTE — Telephone Encounter (Signed)
Filled out prior auth and put in Fax pile. Patient has tried and failed Zocor in past. Should qualify for Crestor. Thanks

## 2016-12-29 NOTE — Telephone Encounter (Signed)
Form faxed to [employer] at [phone]. Hubbard Hartshorn, RN, BSN

## 2016-12-30 NOTE — Telephone Encounter (Signed)
Received faxed request from [employer] for more information. Called [employer] at [phone] and was informed that patient has not had benefits through them since 03/05/2013. Called [facility] and was informed that rosuvastatin was filled on 12/24/2016 without need for PA. Hubbard Hartshorn, RN, BSN

## 2016-12-31 NOTE — Telephone Encounter (Signed)
Okay, well glad she got the medication. Thank you Lauren.

## 2017-01-19 DIAGNOSIS — H2511 Age-related nuclear cataract, right eye: Secondary | ICD-10-CM | POA: Diagnosis not present

## 2017-01-19 DIAGNOSIS — H25811 Combined forms of age-related cataract, right eye: Secondary | ICD-10-CM | POA: Diagnosis not present

## 2017-01-19 DIAGNOSIS — Z01818 Encounter for other preprocedural examination: Secondary | ICD-10-CM | POA: Diagnosis not present

## 2017-02-12 DIAGNOSIS — H35372 Puckering of macula, left eye: Secondary | ICD-10-CM | POA: Diagnosis not present

## 2017-02-12 DIAGNOSIS — H43813 Vitreous degeneration, bilateral: Secondary | ICD-10-CM | POA: Diagnosis not present

## 2017-03-31 DIAGNOSIS — H43813 Vitreous degeneration, bilateral: Secondary | ICD-10-CM | POA: Diagnosis not present

## 2017-03-31 DIAGNOSIS — H35372 Puckering of macula, left eye: Secondary | ICD-10-CM | POA: Diagnosis not present

## 2017-04-09 ENCOUNTER — Ambulatory Visit (INDEPENDENT_AMBULATORY_CARE_PROVIDER_SITE_OTHER): Payer: Medicare Other | Admitting: Family Medicine

## 2017-04-09 ENCOUNTER — Other Ambulatory Visit: Payer: Self-pay

## 2017-04-09 ENCOUNTER — Encounter: Payer: Self-pay | Admitting: Family Medicine

## 2017-04-09 VITALS — BP 154/86 | HR 81 | Temp 97.6°F | Wt 160.0 lb

## 2017-04-09 DIAGNOSIS — I10 Essential (primary) hypertension: Secondary | ICD-10-CM

## 2017-04-09 DIAGNOSIS — E782 Mixed hyperlipidemia: Secondary | ICD-10-CM | POA: Diagnosis not present

## 2017-04-09 DIAGNOSIS — R05 Cough: Secondary | ICD-10-CM

## 2017-04-09 DIAGNOSIS — R059 Cough, unspecified: Secondary | ICD-10-CM

## 2017-04-09 MED ORDER — LISINOPRIL 5 MG PO TABS: 5.0000 mg | ORAL_TABLET | Freq: Every day | ORAL | 3 refills | Status: DC

## 2017-04-09 MED FILL — LISINOPRIL 5 MG TABLET: 5 | 90 days supply | Qty: 90 | Fill #0

## 2017-04-09 NOTE — Assessment & Plan Note (Signed)
  Encouraged patient to take statin. Will recheck lipid panel today per her request. Discussed how she is overall quite healthy with good functional status and our goal would be to reduce the risk of heart attack or stroke. She is agreeable to start statin. Follow up 2 weeks.

## 2017-04-09 NOTE — Patient Instructions (Signed)
  It was great to see you today! Continue using cough drops, honey is also very helpful for cough. Please start taking the blood pressure medication and I'll see you March 26th to see how things are going. We will inform you of your lab results from today's visit.  If you have questions or concerns please do not hesitate to call at [phone].  Lucila Maine, DO PGY-2, [facility] 04/09/2017 11:44 AM

## 2017-04-09 NOTE — Assessment & Plan Note (Signed)
  Discussed with patient the benefit of controlled HTN including decreasing risk of CV events. She is agreeable to start low dose lisinopril today. Prescribed 5 mg. Will see her back in about 2 weeks to assess control and recheck BMP. Will check BMP today.

## 2017-04-09 NOTE — Progress Notes (Signed)
    Subjective:    Patient ID: Melissa Gibbs, female    DOB: 1940/12/02, 77 y.o.   MRN: [id number]  CC: cough/cold persistent  Cough- past 3 weeks. Had sore throat 3 weeks ago and congestion, hard cough. Not productive. Has felt warm from time to time, no chills. Sometimes keeping her up at night. No vomiting. No SOB. Tried cough medication- robitussin DM type of medication. This made it difficult for her to sleep and have vivid dreams.   HTN- has never been on meds, has had some elevated BP in past. She reports she has had a lot of stress in her life recently, sister and nephew passed away, she retired from her job. She is hesitant to start a medication and does not want to be on lifelong meds. She is scheduled for eye surgery and eye doctor stated she needed to get her blood pressure down.   HLD- has not taken crestor that was prescribed. She did pick it up. Again she does not want to be on medications lifelong.   Smoking status reviewed- non-smoker   Review of Systems- no chest pain, SOB, DOE, PND, leg swelling. Endorses cough. Denies fever, chills, vomiting, sputum production.    Objective:  BP (!) 154/86   Pulse 81   Temp 97.6 F (36.4 C) (Oral)   Wt 160 lb (72.6 kg)   SpO2 99%   BMI 30.23 kg/m  Vitals and nursing note reviewed  General: well nourished, in no acute distress HEENT: normocephalic, moist mucous membranes, good dentition without erythema or discharge noted in posterior oropharynx Neck: supple, non-tender, without lymphadenopathy Cardiac: RRR, clear S1 and S2, no murmurs, rubs, or gallops Respiratory: clear to auscultation bilaterally, no increased work of breathing Extremities: no edema or cyanosis Skin: warm and dry, no rashes noted Neuro: alert and oriented, no focal deficits   Assessment & Plan:    HYPERTENSION, BENIGN SYSTEMIC  Discussed with patient the benefit of controlled HTN including decreasing risk of CV events. She is agreeable to start low  dose lisinopril today. Prescribed 5 mg. Will see her back in about 2 weeks to assess control and recheck BMP. Will check BMP today.   Hyperlipidemia  Encouraged patient to take statin. Will recheck lipid panel today per her request. Discussed how she is overall quite healthy with good functional status and our goal would be to reduce the risk of heart attack or stroke. She is agreeable to start statin. Follow up 2 weeks.   Post Viral Cough No fever, productive cough, abnormal vitals or lung exam findings to suggest pneumonia. She had a cold 3 weeks ago. Likely post viral cough. Discussed cough can last 4+ weeks after a virus. Continue supportive care, return if she develops fevers/chills or sputum production. She is agreeable.   Return in about 2 weeks (around 04/23/2017).   Lucila Maine, DO Family Medicine Resident PGY-2

## 2017-04-10 ENCOUNTER — Encounter: Payer: Self-pay | Admitting: Family Medicine

## 2017-04-10 LAB — BASIC METABOLIC PANEL
BUN / CREAT RATIO: 22 (ref 12–28)
BUN: 17 mg/dL (ref 8–27)
CHLORIDE: 101 mmol/L (ref 96–106)
CO2: 26 mmol/L (ref 20–29)
Calcium: 9.7 mg/dL (ref 8.7–10.3)
Creatinine, Ser: 0.76 mg/dL (ref 0.57–1.00)
GFR calc Af Amer: 88 mL/min/{1.73_m2} (ref >59)
GFR calc non Af Amer: 76 mL/min/{1.73_m2} (ref >59)
GLUCOSE: 90 mg/dL (ref 65–99)
Potassium: 5.1 mmol/L (ref 3.5–5.2)
Sodium: 141 mmol/L (ref 134–144)

## 2017-04-10 LAB — LIPID PANEL
CHOLESTEROL TOTAL: 213 mg/dL — AB (ref 100–199)
Chol/HDL Ratio: 4.7 ratio — ABNORMAL HIGH (ref 0.0–4.4)
HDL: 45 mg/dL (ref >39)
LDL CALC: 146 mg/dL — AB (ref 0–99)
Triglycerides: 110 mg/dL (ref 0–149)
VLDL Cholesterol Cal: 22 mg/dL (ref 5–40)

## 2017-04-14 DIAGNOSIS — H5789 Other specified disorders of eye and adnexa: Secondary | ICD-10-CM | POA: Diagnosis not present

## 2017-04-14 DIAGNOSIS — H35372 Puckering of macula, left eye: Secondary | ICD-10-CM | POA: Diagnosis not present

## 2017-04-28 ENCOUNTER — Encounter: Payer: Self-pay | Admitting: Family Medicine

## 2017-04-28 ENCOUNTER — Other Ambulatory Visit: Payer: Self-pay

## 2017-04-28 ENCOUNTER — Ambulatory Visit (INDEPENDENT_AMBULATORY_CARE_PROVIDER_SITE_OTHER): Payer: Medicare Other | Admitting: Family Medicine

## 2017-04-28 VITALS — BP 118/74 | HR 74 | Temp 97.9°F | Wt 162.0 lb

## 2017-04-28 DIAGNOSIS — I10 Essential (primary) hypertension: Secondary | ICD-10-CM

## 2017-04-28 DIAGNOSIS — J302 Other seasonal allergic rhinitis: Secondary | ICD-10-CM

## 2017-04-28 MED ORDER — FLUTICASONE PROPIONATE 50 MCG/ACT NA SUSP: 2.0000 | Freq: Every day | NASAL | 6 refills | Status: DC

## 2017-04-28 MED FILL — FLUTICASONE PROP 50 MCG SPR: 50 | 30 days supply | Qty: 16 | Fill #0

## 2017-04-28 NOTE — Assessment & Plan Note (Signed)
  Rx for flonase given. Advised patient to take OTC antihistamine at night if causes drowsiness. Discussed HEPA filter and decreasing allergens in house. Follow up as needed

## 2017-04-28 NOTE — Progress Notes (Signed)
    Subjective:    Patient ID: Melissa Gibbs, female    DOB: 1940-11-30, 77 y.o.   MRN: [id number]   CC: follow up BP, allergies  HTN- tolerating lisinopril well. Denies CP, SOB, dizziness, orthostasis. Feels well.   Allergies- struggling with allergies. She has tried OTC antihistamines but they make her drowsy. Has never used flonase. She has many cats and dogs. No carpets in her house. NO heavy drapes. She tries to avoid exposure to pollen. Reports watery itchy eyes, nasal congestion, runny nose, cough. Denies fevers, chills.   Smoking status reviewed- non-smoker  Review of Systems- see HPI   Objective:  BP 118/74   Pulse 74   Temp 97.9 F (36.6 C) (Oral)   Wt 162 lb (73.5 kg)   SpO2 99%   BMI 30.61 kg/m  Vitals and nursing note reviewed  General: well nourished, in no acute distress HEENT: normocephalic, TM's visualized bilaterally, no scleral icterus or conjunctival pallor, no nasal discharge, moist mucous membranes, good dentition without erythema or discharge noted in posterior oropharynx Neck: supple, non-tender, without lymphadenopathy Cardiac: RRR, clear S1 and S2, no murmurs, rubs, or gallops Respiratory: clear to auscultation bilaterally, no increased work of breathing Abdomen: soft, nontender, nondistended, no masses or organomegaly. Bowel sounds present Extremities: no edema or cyanosis. Warm, well perfused. 2+ radial and PT pulses bilaterally Skin: warm and dry, no rashes noted Neuro: alert and oriented, no focal deficits   Assessment & Plan:    HYPERTENSION, BENIGN SYSTEMIC  Well controlled with addition of 5 mg lisinopril. Tolerating medication well. Recheck BMP today.   Seasonal allergies  Rx for flonase given. Advised patient to take OTC antihistamine at night if causes drowsiness. Discussed HEPA filter and decreasing allergens in house. Follow up as needed    Return in about 6 months (around 10/29/2017), or as needed.   Lucila Maine,  DO Family Medicine Resident PGY-2

## 2017-04-28 NOTE — Patient Instructions (Addendum)
It was nice to see you today!  Please get the flonase and use this every day to help with allergies. Try using the allergy medicine at night time. Look into getting a HEPA filter.   If you have questions or concerns please do not hesitate to call at [phone].  Lucila Maine, DO PGY-2, [facility] 04/28/2017 9:38 AM    Allergies, Adult An allergy is when your body's defense system (immune system) overreacts to an otherwise harmless substance (allergen) that you breathe in or eat or something that touches your skin. When you come into contact with something that you are allergic to, your immune system produces certain proteins (antibodies). These proteins cause cells to release chemicals (histamines) that trigger the symptoms of an allergic reaction. Allergies often affect the nasal passages (allergic rhinitis), eyes (allergic conjunctivitis), skin (atopic dermatitis), and stomach. Allergies can be mild or severe. Allergies cannot spread from person to person (are not contagious). They can develop at any age and may be outgrown. What increases the risk? You may be at greater risk of allergies if other people in your family have allergies. What are the signs or symptoms? Symptoms depend on what type of allergy you have. They may include:  Runny, stuffy nose.  Sneezing.  Itchy mouth, ears, or throat.  Postnasal drip.  Sore throat.  Itchy, red, watery, or puffy eyes.  Skin rash or hives.  Stomach pain.  Vomiting.  Diarrhea.  Bloating.  Wheezing or coughing.  People with a severe allergy to food, medicine, or an insect bite may have a life-threatening allergic reaction (anaphylaxis). Symptoms of anaphylaxis include:  Hives.  Itching.  Flushed face.  Swollen lips, tongue, or mouth.  Tight or swollen throat.  Chest pain or tightness in the chest.  Trouble breathing or shortness of breath.  Rapid heartbeat.  Dizziness or  fainting.  Vomiting.  Diarrhea.  Pain in the abdomen.  How is this diagnosed? This condition is diagnosed based on:  Your symptoms.  Your family and medical history.  A physical exam.  You may need to see a health care provider who specializes in treating allergies (allergist). You may also have tests, including:  Skin tests to see which allergens are causing your symptoms, such as: ? Skin prick test. In this test, your skin is pricked with a tiny needle and exposed to small amounts of possible allergens to see if your skin reacts. ? Intradermal skin test. In this test, a small amount of allergen is injected under your skin to see if your skin reacts. ? Patch test. In this test, a small amount of allergen is placed on your skin and then your skin is covered with a bandage. Your health care provider will check your skin after a couple of days to see if a rash has developed.  Blood tests.  Challenges tests. In this test, you inhale a small amount of allergen by mouth to see if you have an allergic reaction.  You may also be asked to:  Keep a food diary. A food diary is a record of all the foods and drinks you have in a day and any symptoms you experience.  Practice an elimination diet. An elimination diet involves eliminating specific foods from your diet and then adding them back in one by one to find out if a certain food causes an allergic reaction.  How is this treated? Treatment for allergies depends on your symptoms. Treatment may include:  Cold compresses to soothe  itching and swelling.  Eye drops.  Nasal sprays.  Using a saline spray or container (neti pot) to flush out the nose (nasal irrigation). These methods can help clear away mucus and keep the nasal passages moist.  Using a humidifier.  Oral antihistamines or other medicines to block allergic reaction and inflammation.  Skin creams to treat rashes or itching.  Diet changes to eliminate food allergy  triggers.  Repeated exposure to tiny amounts of allergens to build up a tolerance and prevent future allergic reactions (immunotherapy). These include: ? Allergy shots. ? Oral treatment. This involves taking small doses of an allergen under the tongue (sublingual immunotherapy).  Emergency epinephrine injection (auto-injector) in case of an allergic emergency. This is a self-injectable, pre-measured medicine that must be given within the first few minutes of a serious allergic reaction.  Follow these instructions at home:  Avoid known allergens whenever possible.  If you suffer from airborne allergens, wash out your nose daily. You can do this with a saline spray or a neti pot to flush out your nose (nasal irrigation).  Take over-the-counter and prescription medicines only as told by your health care provider.  Keep all follow-up visits as told by your health care provider. This is important.  If you are at risk of a severe allergic reaction (anaphylaxis), keep your auto-injector with you at all times.  If you have ever had anaphylaxis, wear a medical alert bracelet or necklace that states you have a severe allergy. Contact a health care provider if:  Your symptoms do not improve with treatment. Get help right away if:  You have symptoms of anaphylaxis, such as: ? Swollen mouth, tongue, or throat. ? Pain or tightness in your chest. ? Trouble breathing or shortness of breath. ? Dizziness or fainting. ? Severe abdominal pain, vomiting, or diarrhea. This information is not intended to replace advice given to you by your health care provider. Make sure you discuss any questions you have with your health care provider. Document Released: 04/15/2002 Document Revised: 05/21/2016 Document Reviewed: 08/08/2015 Elsevier Interactive Patient Education  [employer].

## 2017-04-28 NOTE — Assessment & Plan Note (Signed)
  Well controlled with addition of 5 mg lisinopril. Tolerating medication well. Recheck BMP today.

## 2017-04-29 ENCOUNTER — Telehealth: Payer: Self-pay | Admitting: *Deleted

## 2017-04-29 DIAGNOSIS — H35372 Puckering of macula, left eye: Secondary | ICD-10-CM | POA: Diagnosis not present

## 2017-04-29 LAB — BASIC METABOLIC PANEL
BUN / CREAT RATIO: 24 (ref 12–28)
BUN: 19 mg/dL (ref 8–27)
CO2: 25 mmol/L (ref 20–29)
CREATININE: 0.79 mg/dL (ref 0.57–1.00)
Calcium: 9.4 mg/dL (ref 8.7–10.3)
Chloride: 101 mmol/L (ref 96–106)
GFR calc Af Amer: 84 mL/min/{1.73_m2} (ref >59)
GFR calc non Af Amer: 72 mL/min/{1.73_m2} (ref >59)
Glucose: 88 mg/dL (ref 65–99)
Potassium: 4.5 mmol/L (ref 3.5–5.2)
Sodium: 140 mmol/L (ref 134–144)

## 2017-04-29 NOTE — Progress Notes (Signed)
Please let Ms. Goodnow know her kidney function and potassium are good, continue new BP medication, no changes. Thanks!

## 2017-04-29 NOTE — Telephone Encounter (Signed)
Patient informed of normal results. Kennette Cuthrell,CMA

## 2017-04-29 NOTE — Telephone Encounter (Signed)
-----   Message from Steve Rattler, DO sent at 04/29/2017 10:46 AM EDT ----- Please let Ms. Rayborn know her kidney function and potassium are good, continue new BP medication, no changes. Thanks!

## 2017-06-17 MED FILL — ROSUVASTATIN CALCIUM 20 MG: 20 | 30 days supply | Qty: 30 | Fill #1

## 2017-07-01 DIAGNOSIS — L821 Other seborrheic keratosis: Secondary | ICD-10-CM | POA: Diagnosis not present

## 2017-07-01 DIAGNOSIS — Z85828 Personal history of other malignant neoplasm of skin: Secondary | ICD-10-CM | POA: Diagnosis not present

## 2017-07-01 DIAGNOSIS — B078 Other viral warts: Secondary | ICD-10-CM | POA: Diagnosis not present

## 2017-07-01 DIAGNOSIS — L814 Other melanin hyperpigmentation: Secondary | ICD-10-CM | POA: Diagnosis not present

## 2017-07-01 DIAGNOSIS — L918 Other hypertrophic disorders of the skin: Secondary | ICD-10-CM | POA: Diagnosis not present

## 2017-07-23 MED FILL — ROSUVASTATIN CALCIUM 20 MG: 20 | 30 days supply | Qty: 30 | Fill #2

## 2017-07-23 MED FILL — LISINOPRIL 5 MG TABLET: 5 | 90 days supply | Qty: 90 | Fill #1

## 2017-08-10 DIAGNOSIS — H35372 Puckering of macula, left eye: Secondary | ICD-10-CM | POA: Diagnosis not present

## 2017-08-10 DIAGNOSIS — H43813 Vitreous degeneration, bilateral: Secondary | ICD-10-CM | POA: Diagnosis not present

## 2017-09-10 ENCOUNTER — Other Ambulatory Visit: Payer: Self-pay

## 2017-09-10 NOTE — Patient Outreach (Signed)
[facility]) Care Management  09/10/2017  Melissa Gibbs [birth year] [id number]   Medication Adherence call to Melissa Gibbs spoke with patient she is due Rosuvastatin 20 mg she said someone from [facility] had already call her. she did not want to engage again. Melissa Gibbs is showing past due under [place] [employer] care Ins.   [place] Management Direct Dial [phone]  Fax [phone] [email or web address]

## 2017-10-30 ENCOUNTER — Other Ambulatory Visit: Payer: Self-pay

## 2017-10-30 ENCOUNTER — Encounter: Payer: Self-pay | Admitting: Family Medicine

## 2017-10-30 ENCOUNTER — Ambulatory Visit (INDEPENDENT_AMBULATORY_CARE_PROVIDER_SITE_OTHER): Payer: Medicare Other | Admitting: Family Medicine

## 2017-10-30 VITALS — BP 178/90 | HR 84 | Temp 98.5°F | Ht 61.0 in | Wt 163.0 lb

## 2017-10-30 DIAGNOSIS — I10 Essential (primary) hypertension: Secondary | ICD-10-CM | POA: Diagnosis not present

## 2017-10-30 DIAGNOSIS — E785 Hyperlipidemia, unspecified: Secondary | ICD-10-CM

## 2017-10-30 DIAGNOSIS — Z23 Encounter for immunization: Secondary | ICD-10-CM | POA: Diagnosis not present

## 2017-10-30 MED ORDER — LOSARTAN POTASSIUM 25 MG PO TABS: 25.0000 mg | ORAL_TABLET | Freq: Every day | ORAL | 2 refills | Status: DC

## 2017-10-30 MED FILL — LOSARTAN POTASSIUM 25 MG TA: 25 | 30 days supply | Qty: 30 | Fill #0

## 2017-10-30 NOTE — Patient Instructions (Signed)
Nice to see you today!  Please pick up the new blood pressure medication and take this daily. I'd like to keep your blood pressure in a healthy range to reduce risk of heart attack and stroke. If you experience side effects or cannot tolerate taking this please call me!   For the cholesterol medication, try taking half a pill every week and work your way up to taking it daily if you are able. Like I said, any cholesterol medication is better than none to reduce risk.  Keep up the good work with diet and exercise efforts.  I'll see you back in 3 months to check in.  If you have questions or concerns please do not hesitate to call at [phone].  Melissa Maine, DO PGY-3, [facility] 10/30/2017 9:57 AM   Preventing Cerebrovascular Disease Arteries are blood vessels that carry blood that contains oxygen from the heart to all parts of the body. Cerebrovascular disease affects arteries that supply the brain. Any condition that blocks or disrupts blood flow to the brain can cause cerebrovascular disease. Brain cells that lose blood supply start to die within minutes (stroke). Stroke is the main danger of cerebrovascular disease. Atherosclerosis and high blood pressure are common causes of cerebrovascular disease. Atherosclerosis is narrowing and hardening of an artery that results when fat, cholesterol, calcium, or other substances (plaque) build up inside an artery. Plaque reduces blood flow through the artery. High blood pressure increases the risk of bleeding inside the brain. Making diet and lifestyle changes to prevent atherosclerosis and high blood pressure lowers your risk of cerebrovascular disease. What nutrition changes can be made?  Eat more fruits, vegetables, and whole grains.  Reduce how much saturated fat you eat. To do this, eat less red meat and fewer full-fat dairy products.  Eat healthy proteins instead of red meat. Healthy proteins include: ? Fish. Eat fish  that contains heart-healthy omega-3 fatty acids, twice a week. Examples include salmon, albacore tuna, mackerel, and herring. ? Chicken. ? Nuts. ? Low-fat or nonfat yogurt.  Avoid processed meats, like bacon and lunchmeat.  Avoid foods that contain: ? A lot of sugar, such as sweets and drinks with added sugar. ? A lot of salt (sodium). Avoid adding extra salt to your food, as told by your health care provider. ? Trans fats, such as margarine and baked goods. Trans fats may be listed as "partially hydrogenated oils" on food labels.  Check food labels to see how much sodium, sugar, and trans fats are in foods.  Use vegetable oils that contain low amounts of saturated fat, such as olive oil or canola oil. What lifestyle changes can be made?  Drink alcohol in moderation. This means no more than 1 drink a day for nonpregnant women and 2 drinks a day for men. One drink equals 12 oz of beer, 5 oz of wine, or 1 oz of hard liquor.  If you are overweight, ask your health care provider to recommend a weight-loss plan for you. Losing 5-10 lb (2.2-4.5 kg) can reduce your risk of diabetes, atherosclerosis, and high blood pressure.  Exercise for 30?60 minutes on most days, or as much as told by your health care provider. ? Do moderate-intensity exercise, such as brisk walking, bicycling, and water aerobics. Ask your health care provider which activities are safe for you.  Why are these changes important? Making these changes lowers your risk of many diseases that can cause cerebrovascular disease and stroke. Stroke is a leading cause  of death and disability. Making these changes also improves your overall health and quality of life. What can I do to lower my risk? The following factors make you more likely to develop cerebrovascular disease:  Being overweight.  Smoking.  Being physically inactive.  Eating a high-fat diet.  Having certain health conditions, such as: ? Diabetes. ? High blood  pressure. ? Heart disease. ? Atherosclerosis. ? High cholesterol. ? Sickle cell disease.  Talk with your health care provider about your risk for cerebrovascular disease. Work with your health care provider to control diseases that you have that may contribute to cerebrovascular disease. Your health care provider may prescribe medicines to help prevent major causes of cerebrovascular disease. Where to find more information: Learn more about preventing cerebrovascular disease from:  [place], Lung, and [place]: MoAnalyst.de  Centers for Disease Control and Prevention: http://www.curry-wood.biz/  Summary  Cerebrovascular disease can lead to a stroke.  Atherosclerosis and high blood pressure are major causes of cerebrovascular disease.  Making diet and lifestyle changes can reduce your risk of cerebrovascular disease.  Work with your health care provider to get your risk factors under control to reduce your risk of cerebrovascular disease. This information is not intended to replace advice given to you by your health care provider. Make sure you discuss any questions you have with your health care provider. Document Released: 02/04/2015 Document Revised: 08/10/2015 Document Reviewed: 02/04/2015 Elsevier Interactive Patient Education  2018 [employer].

## 2017-10-30 NOTE — Assessment & Plan Note (Signed)
  Patient discontinued crestor. Reviewed reasons we want to control her HLD. Last LDL 146. She is unsure if crestor specifically caused side effects as she began that and the lisinopril at the same time. Asked her to resume crestor once a week and if tolerates increase frequency until she is taking it daily or every other day at least. Follow up 3 months.

## 2017-10-30 NOTE — Assessment & Plan Note (Signed)
  Had a long discussion with patient about treating HTN. She had cough with ACEi. Will switch to ARB. rx for 25 mg losartan sent to pharmacy. Asked patient to call me if she experiences side effects as there are many other options we could try next. Follow up 3 months. Patient verbalized understanding and agreement with plan.

## 2017-10-30 NOTE — Progress Notes (Signed)
    Subjective:    Patient ID: Melissa Gibbs, female    DOB: 1940/05/31, 77 y.o.   MRN: [id number]   CC: changed meds  Patient reports she stopped taking her lisinopril and crestor several months ago due to side effects of cough, weight gain, and "loopy" feeling. She is not using flonase as well. Took vitamin D once. She does not want shingles vaccine.   She is able to tell me the risks of untreated high blood pressure and high cholesterol. Her neighbor just had a stroke and died recently. She does not want that to happen to her.   She acknowledges she needs to become more active and work on losing weight. She is trying to eat a healthier diet.   Smoking status reviewed- former smoker  Review of Systems- no chest pain, SOB, DOE, headaches, vision changes, lower extremity swelling   Objective:  BP (!) 178/90   Pulse 84   Temp 98.5 F (36.9 C) (Oral)   Ht 5\' 1"  (1.549 m)   Wt 163 lb (73.9 kg)   SpO2 98%   BMI 30.80 kg/m  Vitals and nursing note reviewed  General: well nourished, in no acute distress HEENT: normocephalic, MMM Neck: supple, non-tender, without lymphadenopathy Cardiac: RRR, clear S1 and S2, no murmurs, rubs, or gallops Respiratory: clear to auscultation bilaterally, no increased work of breathing Extremities: no edema or cyanosis Neuro: alert and oriented, no focal deficits   Assessment & Plan:    HYPERTENSION, BENIGN SYSTEMIC  Had a long discussion with patient about treating HTN. She had cough with ACEi. Will switch to ARB. rx for 25 mg losartan sent to pharmacy. Asked patient to call me if she experiences side effects as there are many other options we could try next. Follow up 3 months. Patient verbalized understanding and agreement with plan.   Hyperlipidemia  Patient discontinued crestor. Reviewed reasons we want to control her HLD. Last LDL 146. She is unsure if crestor specifically caused side effects as she began that and the lisinopril at the  same time. Asked her to resume crestor once a week and if tolerates increase frequency until she is taking it daily or every other day at least. Follow up 3 months.   Flu shot given today.  Return in about 3 months (around 01/29/2018).   Lucila Maine, DO Family Medicine Resident PGY-3

## 2017-11-10 DIAGNOSIS — Z961 Presence of intraocular lens: Secondary | ICD-10-CM | POA: Diagnosis not present

## 2017-11-20 ENCOUNTER — Other Ambulatory Visit: Payer: Self-pay

## 2017-11-20 ENCOUNTER — Encounter (HOSPITAL_COMMUNITY)
Admission: EM | Disposition: A | Discharge status: Home / Self Care | Payer: Self-pay | Source: Ambulatory Visit | Attending: Cardiovascular Disease

## 2017-11-20 ENCOUNTER — Emergency Department (HOSPITAL_COMMUNITY): Payer: Medicare Other

## 2017-11-20 ENCOUNTER — Ambulatory Visit (INDEPENDENT_AMBULATORY_CARE_PROVIDER_SITE_OTHER)
Admission: EM | Admit: 2017-11-20 | Discharge: 2017-11-20 | Disposition: A | Discharge status: Home / Self Care | Payer: Medicare Other | Source: Home / Self Care

## 2017-11-20 ENCOUNTER — Encounter (HOSPITAL_COMMUNITY): Payer: Self-pay | Admitting: Emergency Medicine

## 2017-11-20 ENCOUNTER — Inpatient Hospital Stay (HOSPITAL_COMMUNITY)
Admission: EM | Admit: 2017-11-20 | Discharge: 2017-11-24 | DRG: 247 | Disposition: A | Discharge status: Home / Self Care | Payer: Medicare Other | Source: Ambulatory Visit | Attending: Cardiovascular Disease | Admitting: Cardiovascular Disease

## 2017-11-20 DIAGNOSIS — Z66 Do not resuscitate: Secondary | ICD-10-CM | POA: Diagnosis present

## 2017-11-20 DIAGNOSIS — Z87891 Personal history of nicotine dependence: Secondary | ICD-10-CM | POA: Diagnosis not present

## 2017-11-20 DIAGNOSIS — J302 Other seasonal allergic rhinitis: Secondary | ICD-10-CM | POA: Diagnosis not present

## 2017-11-20 DIAGNOSIS — I214 Non-ST elevation (NSTEMI) myocardial infarction: Secondary | ICD-10-CM | POA: Diagnosis not present

## 2017-11-20 DIAGNOSIS — I472 Ventricular tachycardia: Secondary | ICD-10-CM | POA: Diagnosis not present

## 2017-11-20 DIAGNOSIS — E876 Hypokalemia: Secondary | ICD-10-CM | POA: Diagnosis not present

## 2017-11-20 DIAGNOSIS — I249 Acute ischemic heart disease, unspecified: Secondary | ICD-10-CM | POA: Insufficient documentation

## 2017-11-20 DIAGNOSIS — E785 Hyperlipidemia, unspecified: Secondary | ICD-10-CM | POA: Diagnosis not present

## 2017-11-20 DIAGNOSIS — Z8742 Personal history of other diseases of the female genital tract: Secondary | ICD-10-CM

## 2017-11-20 DIAGNOSIS — I213 ST elevation (STEMI) myocardial infarction of unspecified site: Secondary | ICD-10-CM

## 2017-11-20 DIAGNOSIS — Z808 Family history of malignant neoplasm of other organs or systems: Secondary | ICD-10-CM

## 2017-11-20 DIAGNOSIS — Z8249 Family history of ischemic heart disease and other diseases of the circulatory system: Secondary | ICD-10-CM

## 2017-11-20 DIAGNOSIS — Z7951 Long term (current) use of inhaled steroids: Secondary | ICD-10-CM | POA: Diagnosis not present

## 2017-11-20 DIAGNOSIS — R079 Chest pain, unspecified: Secondary | ICD-10-CM

## 2017-11-20 DIAGNOSIS — Z79899 Other long term (current) drug therapy: Secondary | ICD-10-CM | POA: Diagnosis not present

## 2017-11-20 DIAGNOSIS — Z8619 Personal history of other infectious and parasitic diseases: Secondary | ICD-10-CM

## 2017-11-20 DIAGNOSIS — Z880 Allergy status to penicillin: Secondary | ICD-10-CM | POA: Diagnosis not present

## 2017-11-20 DIAGNOSIS — I2121 ST elevation (STEMI) myocardial infarction involving left circumflex coronary artery: Secondary | ICD-10-CM | POA: Diagnosis not present

## 2017-11-20 DIAGNOSIS — Z800 Family history of malignant neoplasm of digestive organs: Secondary | ICD-10-CM

## 2017-11-20 DIAGNOSIS — I10 Essential (primary) hypertension: Secondary | ICD-10-CM | POA: Diagnosis present

## 2017-11-20 DIAGNOSIS — R112 Nausea with vomiting, unspecified: Secondary | ICD-10-CM | POA: Diagnosis not present

## 2017-11-20 DIAGNOSIS — I5030 Unspecified diastolic (congestive) heart failure: Secondary | ICD-10-CM | POA: Diagnosis not present

## 2017-11-20 DIAGNOSIS — I480 Paroxysmal atrial fibrillation: Secondary | ICD-10-CM | POA: Diagnosis not present

## 2017-11-20 DIAGNOSIS — I739 Peripheral vascular disease, unspecified: Secondary | ICD-10-CM | POA: Diagnosis not present

## 2017-11-20 DIAGNOSIS — I255 Ischemic cardiomyopathy: Secondary | ICD-10-CM

## 2017-11-20 DIAGNOSIS — Z85828 Personal history of other malignant neoplasm of skin: Secondary | ICD-10-CM | POA: Diagnosis not present

## 2017-11-20 DIAGNOSIS — Z90710 Acquired absence of both cervix and uterus: Secondary | ICD-10-CM | POA: Diagnosis not present

## 2017-11-20 DIAGNOSIS — I2510 Atherosclerotic heart disease of native coronary artery without angina pectoris: Secondary | ICD-10-CM | POA: Diagnosis not present

## 2017-11-20 DIAGNOSIS — I2119 ST elevation (STEMI) myocardial infarction involving other coronary artery of inferior wall: Secondary | ICD-10-CM

## 2017-11-20 DIAGNOSIS — R0602 Shortness of breath: Secondary | ICD-10-CM | POA: Diagnosis not present

## 2017-11-20 DIAGNOSIS — I25110 Atherosclerotic heart disease of native coronary artery with unstable angina pectoris: Secondary | ICD-10-CM | POA: Diagnosis not present

## 2017-11-20 DIAGNOSIS — Z955 Presence of coronary angioplasty implant and graft: Secondary | ICD-10-CM

## 2017-11-20 HISTORY — DX: Atherosclerotic heart disease of native coronary artery without angina pectoris: I25.10

## 2017-11-20 HISTORY — DX: Ischemic cardiomyopathy: I25.5

## 2017-11-20 HISTORY — PX: CORONARY STENT INTERVENTION: CATH118234

## 2017-11-20 HISTORY — DX: Peripheral vascular disease, unspecified: I73.9

## 2017-11-20 HISTORY — DX: Acute ischemic heart disease, unspecified: I24.9

## 2017-11-20 HISTORY — PX: LEFT HEART CATH AND CORONARY ANGIOGRAPHY: CATH118249

## 2017-11-20 HISTORY — DX: Unspecified atrial fibrillation: I48.91

## 2017-11-20 HISTORY — PX: CORONARY/GRAFT ACUTE MI REVASCULARIZATION: CATH118305

## 2017-11-20 LAB — CBC
HEMATOCRIT: 40 % (ref 36.0–46.0)
Hemoglobin: 12.6 g/dL (ref 12.0–15.0)
MCH: 28.6 pg (ref 26.0–34.0)
MCHC: 31.5 g/dL (ref 30.0–36.0)
MCV: 90.9 fL (ref 80.0–100.0)
NRBC: 0 % (ref 0.0–0.2)
Platelets: 311 10*3/uL (ref 150–400)
RBC: 4.4 MIL/uL (ref 3.87–5.11)
RDW: 13.1 % (ref 11.5–15.5)
WBC: 10.4 10*3/uL (ref 4.0–10.5)

## 2017-11-20 LAB — BASIC METABOLIC PANEL
Anion gap: 12 (ref 5–15)
BUN: 19 mg/dL (ref 8–23)
CO2: 25 mmol/L (ref 22–32)
Calcium: 9.9 mg/dL (ref 8.9–10.3)
Chloride: 101 mmol/L (ref 98–111)
Creatinine, Ser: 0.82 mg/dL (ref 0.44–1.00)
GFR calc non Af Amer: >60 mL/min (ref >60)
Glucose, Bld: 151 mg/dL — ABNORMAL HIGH (ref 70–99)
Potassium: 4.5 mmol/L (ref 3.5–5.1)
Sodium: 138 mmol/L (ref 135–145)

## 2017-11-20 LAB — I-STAT TROPONIN, ED: Troponin i, poc: 0.09 ng/mL (ref 0.00–0.08)

## 2017-11-20 LAB — LIPID PANEL
CHOL/HDL RATIO: 3.9 ratio
CHOLESTEROL: 196 mg/dL (ref 0–200)
HDL: 50 mg/dL (ref >40)
LDL CALC: 127 mg/dL — AB (ref 0–99)
TRIGLYCERIDES: 95 mg/dL (ref <150)
VLDL: 19 mg/dL (ref 0–40)

## 2017-11-20 LAB — TROPONIN I: Troponin I: 0.28 ng/mL (ref <0.03)

## 2017-11-20 LAB — PROTIME-INR
INR: 1.06
PROTHROMBIN TIME: 13.7 s (ref 11.4–15.2)

## 2017-11-20 LAB — APTT: aPTT: 30 seconds (ref 24–36)

## 2017-11-20 SURGERY — CORONARY/GRAFT ACUTE MI REVASCULARIZATION
Anesthesia: LOCAL

## 2017-11-20 MED ORDER — NITROGLYCERIN 1 MG/10 ML FOR IR/CATH LAB
INTRA_ARTERIAL | Status: AC
  Filled 2017-11-20: qty 10

## 2017-11-20 MED ORDER — TICAGRELOR 90 MG PO TABS
ORAL_TABLET | ORAL | Status: AC
  Filled 2017-11-20: qty 2

## 2017-11-20 MED ORDER — DOPAMINE-DEXTROSE 3.2-5 MG/ML-% IV SOLN: 0.0000 ug/kg/min | INTRAVENOUS | Status: DC

## 2017-11-20 MED ORDER — VERAPAMIL HCL 2.5 MG/ML IV SOLN
INTRAVENOUS | Status: DC | PRN
  Administered 2017-11-20: 10 mL via INTRA_ARTERIAL

## 2017-11-20 MED ORDER — SODIUM CHLORIDE 0.9 % IV SOLN
INTRAVENOUS | Status: DC | PRN
  Administered 2017-11-20: 1.75 mg/kg/h via INTRAVENOUS

## 2017-11-20 MED ORDER — NITROGLYCERIN 1 MG/10 ML FOR IR/CATH LAB
INTRA_ARTERIAL | Status: DC | PRN
  Administered 2017-11-20: 100 ug via INTRACORONARY

## 2017-11-20 MED ORDER — ONDANSETRON 4 MG PO TBDP
4.0000 mg | ORAL_TABLET | Freq: Once | ORAL | Status: AC
  Administered 2017-11-20: 4 mg via ORAL
  Filled 2017-11-20: qty 1

## 2017-11-20 MED ORDER — LIDOCAINE HCL (PF) 1 % IJ SOLN
INTRAMUSCULAR | Status: DC | PRN
  Administered 2017-11-20: 10 mL
  Administered 2017-11-20: 2 mL

## 2017-11-20 MED ORDER — HEPARIN BOLUS VIA INFUSION
4000.0000 [IU] | Freq: Once | INTRAVENOUS | Status: AC
  Administered 2017-11-20: 4000 [IU] via INTRAVENOUS
  Filled 2017-11-20: qty 4000

## 2017-11-20 MED ORDER — ASPIRIN 81 MG PO CHEW: 324.0000 mg | CHEWABLE_TABLET | Freq: Once | ORAL | Status: DC

## 2017-11-20 MED ORDER — VERAPAMIL HCL 2.5 MG/ML IV SOLN
INTRAVENOUS | Status: AC
  Filled 2017-11-20: qty 2

## 2017-11-20 MED ORDER — SODIUM CHLORIDE 0.9 % IV BOLUS
250.0000 mL | Freq: Once | INTRAVENOUS | Status: AC
  Administered 2017-11-20: 250 mL via INTRAVENOUS

## 2017-11-20 MED ORDER — BIVALIRUDIN BOLUS VIA INFUSION - CUPID
INTRAVENOUS | Status: DC | PRN
  Administered 2017-11-20: 56.1 mg via INTRAVENOUS

## 2017-11-20 MED ORDER — HEPARIN (PORCINE) IN NACL 1000-0.9 UT/500ML-% IV SOLN
INTRAVENOUS | Status: DC | PRN
  Administered 2017-11-20 (×2): 500 mL

## 2017-11-20 MED ORDER — LIDOCAINE HCL (PF) 1 % IJ SOLN
INTRAMUSCULAR | Status: AC
  Filled 2017-11-20: qty 30

## 2017-11-20 MED ORDER — IOHEXOL 350 MG/ML SOLN
INTRAVENOUS | Status: DC | PRN
  Administered 2017-11-20: 115 mL via INTRACARDIAC

## 2017-11-20 MED ORDER — HEPARIN (PORCINE) IN NACL 100-0.45 UNIT/ML-% IJ SOLN
800.0000 [IU]/h | INTRAMUSCULAR | Status: DC
  Administered 2017-11-20: 800 [IU]/h via INTRAVENOUS
  Filled 2017-11-20: qty 250

## 2017-11-20 MED ORDER — DOPAMINE-DEXTROSE 3.2-5 MG/ML-% IV SOLN
INTRAVENOUS | Status: AC
  Filled 2017-11-20: qty 250

## 2017-11-20 MED ORDER — BIVALIRUDIN TRIFLUOROACETATE 250 MG IV SOLR
INTRAVENOUS | Status: AC
  Filled 2017-11-20: qty 250

## 2017-11-20 MED ORDER — SODIUM CHLORIDE 0.9 % IV SOLN
INTRAVENOUS | Status: AC | PRN
  Administered 2017-11-20: 250 mL via INTRAVENOUS

## 2017-11-20 MED ORDER — NITROGLYCERIN 0.4 MG SL SUBL: 0.4000 mg | SUBLINGUAL_TABLET | Freq: Once | SUBLINGUAL | Status: DC

## 2017-11-20 MED ORDER — SODIUM CHLORIDE 0.9 % IV SOLN
INTRAVENOUS | Status: DC
  Administered 2017-11-20: 23:00:00 via INTRAVENOUS

## 2017-11-20 MED ORDER — HEPARIN (PORCINE) IN NACL 1000-0.9 UT/500ML-% IV SOLN
INTRAVENOUS | Status: AC
  Filled 2017-11-20: qty 1000

## 2017-11-20 SURGICAL SUPPLY — 25 items
BALLN SAPPHIRE 2.5X15 (BALLOONS) ×2
BALLN ~~LOC~~ EMERGE MR 4.0X12 (BALLOONS) ×2
BALLOON SAPPHIRE 2.5X15 (BALLOONS) IMPLANT
BALLOON ~~LOC~~ EMERGE MR 4.0X12 (BALLOONS) IMPLANT
CATH 5FR JL3.5 JR4 ANG PIG MP (CATHETERS) ×1 IMPLANT
CATH INFINITI 5FR JL4 (CATHETERS) ×1 IMPLANT
CATH LAUNCHER 6FR EBU3.5 (CATHETERS) ×1 IMPLANT
DEVICE CLOSURE PERCLS PRGLD 6F (VASCULAR PRODUCTS) IMPLANT
DEVICE RAD COMP TR BAND LRG (VASCULAR PRODUCTS) ×1 IMPLANT
GLIDESHEATH SLEND SS 6F .021 (SHEATH) ×1 IMPLANT
GUIDEWIRE INQWIRE 1.5J.035X260 (WIRE) IMPLANT
INQWIRE 1.5J .035X260CM (WIRE) ×2
KIT ENCORE 26 ADVANTAGE (KITS) ×1 IMPLANT
KIT HEART LEFT (KITS) ×2 IMPLANT
PACK CARDIAC CATHETERIZATION (CUSTOM PROCEDURE TRAY) ×2 IMPLANT
PERCLOSE PROGLIDE 6F (VASCULAR PRODUCTS) ×4
SHEATH PINNACLE 6F 10CM (SHEATH) ×1 IMPLANT
SHEATH PROBE COVER 6X72 (BAG) ×1 IMPLANT
STENT SIERRA 4.00 X 15 MM (Permanent Stent) ×1 IMPLANT
SYR MEDRAD MARK V 150ML (SYRINGE) ×2 IMPLANT
TRANSDUCER W/STOPCOCK (MISCELLANEOUS) ×2 IMPLANT
TUBING CIL FLEX 10 FLL-RA (TUBING) ×2 IMPLANT
WIRE COUGAR XT STRL 190CM (WIRE) ×1 IMPLANT
WIRE COUGAR XT STRL 300CM (WIRE) ×1 IMPLANT
WIRE EMERALD 3MM-J .035X150CM (WIRE) ×1 IMPLANT

## 2017-11-20 NOTE — ED Triage Notes (Signed)
Pt reports central chest burning that radiates to her throat. Pt thought it was indigestion, took an antacid with some relief but pain is now back. Hx of htn, compliant with medications.

## 2017-11-20 NOTE — ED Notes (Signed)
Istat trop results given to me by lab tech.  Will room in next available room

## 2017-11-20 NOTE — ED Provider Notes (Signed)
[facility] EMERGENCY DEPARTMENT Provider Note   CSN: [id number] Arrival date & time: 11/20/17  1940     History   Chief Complaint Chief Complaint  Patient presents with  . Chest Pain    HPI Melissa Gibbs is a 77 y.o. female presenting for evaluation of chest pain.  Pt states pain began acutely at 4:00 this afternoon while she was watching TV.  She thought was indigestion, took an antacid and pain mildly improved.  However, pain soon returned and was stronger than previously.  Patient states that since the pain began, she has now developed associated shortness of breath.  She had one episode of vomiting.  She states pain is worse and very severe.  She did take several baby aspirin at home when the pain first started.  She has not taken anything else for her pain.  Patient denies cardiac history.  She has never seen a cardiologist before.  She has a history of hypertension and hyperlipidemia for which she takes occasions.  She denies a history of diabetes.  Former smoker, quit 40 years ago.  She is not on blood thinners.  HPI  Past Medical History:  Diagnosis Date  . Hyperlipidemia   . Hypertension   . Shingles 2000   R flank, mild, self reported, did not present to care     Patient Active Problem List   Diagnosis Date Noted  . Seasonal allergies 04/28/2017  . Healthcare maintenance 11/07/2015  . SK (seborrheic keratosis) 05/20/2013  . 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 Overweight (BMI 25.0-29.9) 05/20/2013  . H/O nonmelanoma skin cancer 05/20/2013  . Hyperlipidemia 04/02/2006  . HYPERTENSION, BENIGN SYSTEMIC 04/02/2006    Past Surgical History:  Procedure Laterality Date  . ABDOMINAL HYSTERECTOMY     fibroids   . SKIN CANCER EXCISION  2014   [facility] regularly     OB History   None      Home Medications    Prior to Admission medications   Medication Sig Start Date End Date Taking? Authorizing Provider  fluticasone  (FLONASE) 50 MCG/ACT nasal spray Place 2 sprays into both nostrils daily. 04/28/17   Steve Rattler, DO  losartan (COZAAR) 25 MG tablet Take 1 tablet (25 mg total) by mouth daily. 10/30/17   Steve Rattler, DO  rosuvastatin (CRESTOR) 20 MG tablet Take 1 tablet (20 mg total) by mouth daily. 12/23/16   Steve Rattler, DO    Family History Family History  Problem Relation Age of Onset  . Heart disease Mother 36       MI , rheumatic fever as a child, and heart murmur  . Cancer Father        knee cancer  . Colon cancer Father   . Hypertension Father   . Cancer Sister        melanoma - died at 32  . Melanoma Other        died at age 77  . Stroke Neg Hx     Social History Social History   Tobacco Use  . Smoking status: Former Smoker    Types: Cigarettes  . Smokeless tobacco: Never Used  . Tobacco comment: quit smoking at age 31  Substance Use Topics  . Alcohol use: Yes    Comment: Very rarely, never by herself  . Drug use: No     Allergies   Amoxicillin   Review of Systems Review of Systems  Respiratory: Positive for shortness of breath.  Cardiovascular: Positive for chest pain.  Gastrointestinal: Positive for nausea and vomiting.  All other systems reviewed and are negative.    Physical Exam Updated Vital Signs BP 123/85   Pulse 65   Temp (!) 97.5 F (36.4 C) (Oral)   Resp 17   Ht 5\' 2"  (1.575 m)   Wt 74.8 kg   SpO2 100%   BMI 30.18 kg/m   Physical Exam  Constitutional: She is oriented to person, place, and time. She appears well-developed and well-nourished.  Elderly female who appears uncomfortable due to pain  HENT:  Head: Normocephalic and atraumatic.  Eyes: Pupils are equal, round, and reactive to light. Conjunctivae and EOM are normal.  Neck: Normal range of motion. Neck supple.  Cardiovascular: Normal rate, regular rhythm and intact distal pulses.  Pulmonary/Chest: Effort normal and breath sounds normal. No respiratory distress. She has no  wheezes.  Abdominal: Soft. She exhibits no distension. There is no tenderness.  Musculoskeletal: Normal range of motion.  Radial and pedal pulses intact  Neurological: She is alert and oriented to person, place, and time.  Skin: Skin is warm and dry. Capillary refill takes less than 2 seconds. She is not diaphoretic.  Psychiatric: She has a normal mood and affect.  Nursing note and vitals reviewed.    ED Treatments / Results  Labs (all labs ordered are listed, but only abnormal results are displayed) Labs Reviewed  BASIC METABOLIC PANEL - Abnormal; Notable for the following components:      Result Value   Glucose, Bld 151 (*)    All other components within normal limits  I-STAT TROPONIN, ED - Abnormal; Notable for the following components:   Troponin i, poc 0.09 (*)    All other components within normal limits  CBC  PROTIME-INR  APTT  TROPONIN I  LIPID PANEL    EKG EKG Interpretation  Date/Time:  Friday November 20 2017 22:14:52 EDT Ventricular Rate:  91 PR Interval:    QRS Duration: 89 QT Interval:  363 QTC Calculation: 447 R Axis:   57 Text Interpretation:  Atrial fibrillation Inferior infarct, acute (RCA) Lateral leads are also involved Probable RV involvement, suggest recording right precordial leads >>> Acute MI <<< Confirmed by Davonna Belling [phone]) on 11/20/2017 10:19:04 PM   Radiology Dg Chest 2 View  Result Date: 11/20/2017 CLINICAL DATA:  Chest pain today. EXAM: CHEST - 2 VIEW COMPARISON:  None. FINDINGS: Lung volumes are low but the lungs are clear. Heart size is normal. No pneumothorax or pleural fluid. No acute or focal bony abnormality. Scattered spondylosis noted. IMPRESSION: No acute disease. Electronically Signed   By: Inge Rise M.D.   On: 11/20/2017 20:23    Procedures .Critical Care Performed by: Franchot Heidelberg, PA-C Authorized by: Franchot Heidelberg, PA-C   Critical care provider statement:    Critical care time (minutes):  35    Critical care time was exclusive of:  Separately billable procedures and treating other patients and teaching time   Critical care was necessary to treat or prevent imminent or life-threatening deterioration of the following conditions:  Cardiac failure   Critical care was time spent personally by me on the following activities:  Blood draw for specimens, development of treatment plan with patient or surrogate, discussions with consultants, evaluation of patient's response to treatment, examination of patient, obtaining history from patient or surrogate, ordering and performing treatments and interventions, ordering and review of laboratory studies, ordering and review of radiographic studies, pulse oximetry, re-evaluation of patient's condition  and review of old charts   I assumed direction of critical care for this patient from another provider in my specialty: no   Comments:     Patient presenting with chest pain.  Troponin positive.  Upon seeing the patient, patient appears very uncomfortable.  I immediately ordered a repeat EKG, which showed T wave changes.  Concern for STEMI.  ASA already given.  Heparin ordered.  Immediately discussed with attending, cardiology called and code STEMI called.   (including critical care time)  Medications Ordered in ED Medications  0.9 %  sodium chloride infusion (has no administration in time range)  ondansetron (ZOFRAN-ODT) disintegrating tablet 4 mg (4 mg Oral Given 11/20/17 2144)     Initial Impression / Assessment and Plan / ED Course  I have reviewed the triage vital signs and the nursing notes.  Pertinent labs & imaging results that were available during my care of the patient were reviewed by me and considered in my medical decision making (see chart for details).     Pt presenting for evaluation of CP. On exam pt appears very uncomfortable. Had one episode of vomiting in the waiting room. HR variable between 35 and 100. I am concerned pt may be  having ACS event. Initial trop elevated at 0.07. Initial EKG without stemi, will obtain repeat ekg due to pt's presentation.   Repeat EKG shows stemi. Immediately discussed with attending, and called code stemi. Dr. Alvino Chapel evaluated the pt. Pt took ASA at home. heparin started. Will hold off on nitro due to concern for possible inferior stemi.   Pt taken to cath lab and admitted to cardiology.  Final Clinical Impressions(s) / ED Diagnoses   Final diagnoses:  ST elevation myocardial infarction (STEMI), unspecified artery [facility])    ED Discharge Orders    None       Franchot Heidelberg, PA-C 11/21/17 0645    Davonna Belling, MD 11/21/17 2329

## 2017-11-20 NOTE — H&P (Signed)
Cardiology Consult    Patient ID: Melissa Gibbs MRN: [id number], DOB/AGE: 05-May-1940   Admit date: 11/20/2017 Date of Consult: 11/20/2017  Primary Physician: Steve Rattler, DO Primary Cardiologist: No primary care provider on file.   Patient Profile    Melissa Gibbs is a 77 y.o. female with a history of HTN and HLD who presented to Urgent care with CP starting at 5. Onset of pain was gradual. She never had pain like that before. Her SBP is controlled at home.   At urgent care the ECG showed borderline ST chages. Sent over to ED. 10pm Ecg turned positive for STEMI. She tool two asa at home and got 4000 units of heparin in the ED.  Had Afib and runs of bradycardia. BP dropped to 90's form 120. NS given.   She is accompanied by her daughter.  Past Medical History   Past Medical History:  Diagnosis Date  . Hyperlipidemia   . Hypertension   . Shingles 2000   R flank, mild, self reported, did not present to care     Past Surgical History:  Procedure Laterality Date  . ABDOMINAL HYSTERECTOMY     fibroids   . SKIN CANCER EXCISION  2014   [facility] regularly     Allergies  Allergies  Allergen Reactions  . Amoxicillin Diarrhea     Family History    Family History  Problem Relation Age of Onset  . Heart disease Mother 68       MI , rheumatic fever as a child, and heart murmur  . Cancer Father        knee cancer  . Colon cancer Father   . Hypertension Father   . Cancer Sister        melanoma - died at 69  . Melanoma Other        died at age 86  . Stroke Neg Hx    She indicated that her mother is deceased. She indicated that her father is deceased. She indicated that her sister is deceased. She indicated that her brother is alive. She indicated that the status of her neg hx is unknown. She indicated that her other is deceased.   Social History    Social History   Socioeconomic History  . Marital status: Divorced    Spouse name: Not on  file  . Number of children: Not on file  . Years of education: Not on file  . Highest education level: Not on file  Occupational History  . 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 Occupation: Public affairs consultant: [employer]  . Financial resource strain: Not on file  . Food insecurity:    Worry: Not on file    Inability: Not on file  . Transportation needs:    Medical: Not on file    Non-medical: Not on file  Tobacco Use  . Smoking status: Former Smoker    Types: Cigarettes  . Smokeless tobacco: Never Used  . Tobacco comment: quit smoking at age 88  Substance and Sexual Activity  . Alcohol use: Yes    Comment: Very rarely, never by herself  . Drug use: No  . Sexual activity: Never  Lifestyle  . Physical activity:    Days per week: Not on file    Minutes per session: Not on file  . Stress: Not on file  Relationships  .  Social connections:    Talks on phone: Not on file    Gets together: Not on file    Attends religious service: Not on file    Active member of club or organization: Not on file    Attends meetings of clubs or organizations: Not on file    Relationship status: Not on file  . Intimate partner violence:    Fear of current or ex partner: Not on file    Emotionally abused: Not on file    Physically abused: Not on file    Forced sexual activity: Not on file  Other Topics Concern  . Not on file  Social History Narrative   Divorced, lives alone.  One adult daughter, [name].  DNR/DNI.    Feels strongly when it is her time she would like to pass naturally, her daughter is CNA for elderly person.        Likes to work in the yard when its not so hot out. Works a lot in [employer] at [employer].     Review of Systems    General:  No chills, fever, night sweats or weight changes.  Cardiovascular:  No chest pain, dyspnea on exertion, edema, orthopnea, palpitations, paroxysmal nocturnal  dyspnea. Dermatological: No rash, lesions/masses Respiratory: No cough, dyspnea Urologic: No hematuria, dysuria Abdominal:   No nausea, vomiting, diarrhea, bright red blood per rectum, melena, or hematemesis Neurologic:  No visual changes, wkns, changes in mental status. All other systems reviewed and are otherwise negative except as noted above.  Physical Exam    Blood pressure (!) 123/93, pulse 66, temperature (!) 97.5 F (36.4 C), temperature source Oral, resp. rate 13, height 5\' 2"  (1.575 m), weight 74.8 kg, SpO2 100 %.  General: in discomfort Psych: Normal affect. Neuro: Alert and oriented X 3. Moves all extremities spontaneously. HEENT: Normal  Neck: Supple without bruits or JVD. Lungs:  Resp regular and unlabored, CTA. Heart: RRR no s3, s4, or murmurs. Abdomen: Soft, non-tender, non-distended, BS + x 4.  Extremities: No clubbing, cyanosis or edema. DP/PT/Radials 1+ and equal bilaterally.  Labs    Troponin [facility] Test) Recent Labs    11/20/17 2015  TROPIPOC 0.09*   No results for input(s): CKTOTAL, CKMB, TROPONINI in the last 72 hours. Lab Results  Component Value Date   WBC 10.4 11/20/2017   HGB 12.6 11/20/2017   HCT 40.0 11/20/2017   MCV 90.9 11/20/2017   PLT 311 11/20/2017    Recent Labs  Lab 11/20/17 2007  NA 138  K 4.5  CL 101  CO2 25  BUN 19  CREATININE 0.82  CALCIUM 9.9  GLUCOSE 151*   Lab Results  Component Value Date   CHOL 213 (H) 04/09/2017   HDL 45 04/09/2017   LDLCALC 146 (H) 04/09/2017   TRIG 110 04/09/2017   No results found for: [facility]   Radiology Studies    Dg Chest 2 View  Result Date: 11/20/2017 CLINICAL DATA:  Chest pain today. EXAM: CHEST - 2 VIEW COMPARISON:  None. FINDINGS: Lung volumes are low but the lungs are clear. Heart size is normal. No pneumothorax or pleural fluid. No acute or focal bony abnormality. Scattered spondylosis noted. IMPRESSION: No acute disease. Electronically Signed   By: Inge Rise M.D.    On: 11/20/2017 20:23    ECG & Cardiac Imaging    Inferior STEMI  Assessment & Plan    STEMI; Inferior, Acute. History of HTn and HLD. Afib in setting of acute  MI. Will need to determine whether this is an isolation occurrence prior to d/c.   Plan: - LHC - ASA/ticargrelor  - Hold BB - restart Statin - hold ARB   Signed, Cristina Gong, MD 11/20/2017, 10:56 PM  For questions or updates, please contact   Please consult [email or web address] for contact info under Cardiology/STEMI.

## 2017-11-20 NOTE — Progress Notes (Addendum)
ANTICOAGULATION CONSULT NOTE - Initial Consult  Pharmacy Consult for heparin Indication: chest pain/ACS  Allergies  Allergen Reactions  . Amoxicillin Diarrhea    Patient Measurements: Height: 5\' 2"  (157.5 cm) Weight: 165 lb (74.8 kg) IBW/kg (Calculated) : 50.1 Heparin Dosing Weight: 66.3 kg  Vital Signs: Temp: 97.5 F (36.4 C) (10/18 1951) Temp Source: Oral (10/18 1951) BP: 123/85 (10/18 2200) Pulse Rate: 65 (10/18 2200)  Labs: Recent Labs    11/20/17 2007  HGB 12.6  HCT 40.0  PLT 311  CREATININE 0.82    Estimated Creatinine Clearance: Melissa.4 mL/min (by C-G formula based on SCr of 0.82 mg/dL).   Medical History: Past Medical History:  Diagnosis Date  . Hyperlipidemia   . Hypertension   . Shingles 2000   R flank, mild, self reported, did not present to care     Medications:  Scheduled:  . heparin  4,000 Units Intravenous Once   Infusions:  . sodium chloride    . DOPamine    . heparin      Assessment: Pt is a Melissa Gibbs presenting with intermittent chest pain. Troponin is elevated at 0.09. Code STEMI initiated. Pt takes no PTA anticoagulants. Pharmacy consulted for heparin dosing for ACS/chest pain. Plt - 311, Hgb - 12.6  Goal of Therapy:  Heparin level 0.3-0.7 units/ml Monitor platelets by anticoagulation protocol: Yes   Plan:  Heparin 4000 unit bolus x1, then heparin gtt 800 units/hr 8-Hour HL at 0630 Daily HL and CBC  Sheral Pfahler J Jareb Radoncic 11/20/2017,10:30 PM

## 2017-11-20 NOTE — ED Provider Notes (Signed)
Patient placed in Quick Look pathway, seen and evaluated   Chief Complaint: CP  HPI:   Burning central CP beginning around 4PM while doing paperwork. Improving. Took an antacid with improvement. Mild lightheadedness. Denies fever nausea, vomiting, SOB, diaphoresis.  States she took her blood pressure medicine this morning.  ROS: +CP  Physical Exam:   Gen: No distress  Neuro: Awake and Alert  Skin: Warm    Focused Exam: Heart regular rate and rhythm, no murmurs rubs or gallops.  Lungs clear to auscultation bilaterally.  No tenderness to palpation of the chest wall. 2+ radial and DP/PT pulses bilaterally, Homans sign absent bilaterally, no lower extremity edema, no palpable cords, compartments are soft    Initiation of care has begun. The patient has been counseled on the process, plan, and necessity for staying for the completion/evaluation, and the remainder of the medical screening examination    Debroah Baller 11/20/17 2001    Long, Joshua G, MD 11/21/17 1009

## 2017-11-20 NOTE — ED Notes (Signed)
Teodoro Kil and MD Long informed of troponin results .09

## 2017-11-20 NOTE — ED Triage Notes (Signed)
Pt c/o cp for the last three hours off and on, pt states she takes blood pressure medicine. Describes it as burning. Per dr. Meda Coffee pt needs eval in ER. Pt agreeable to plan, ekg obtain. Pt left for ER with daughter.

## 2017-11-20 NOTE — ED Notes (Signed)
Patient's daughter up to desk, states patient felt nauseous and went to restroom.  This Rn went to collect Zofran and upon finding patient, patient pale, diaphoretic.  With positive trop result, Charge RN called and patient roomed.  EMT taking patient to room at this time

## 2017-11-20 NOTE — ED Notes (Signed)
Due to elevated troponin, will prioritize rooming and upgrade acuity

## 2017-11-21 DIAGNOSIS — I2119 ST elevation (STEMI) myocardial infarction involving other coronary artery of inferior wall: Secondary | ICD-10-CM

## 2017-11-21 LAB — CBC
HCT: 35.7 % — ABNORMAL LOW (ref 36.0–46.0)
HCT: 37 % (ref 36.0–46.0)
HEMOGLOBIN: 10.9 g/dL — AB (ref 12.0–15.0)
Hemoglobin: 11.5 g/dL — ABNORMAL LOW (ref 12.0–15.0)
MCH: 27.4 pg (ref 26.0–34.0)
MCH: 27.8 pg (ref 26.0–34.0)
MCHC: 30.5 g/dL (ref 30.0–36.0)
MCHC: 31.1 g/dL (ref 30.0–36.0)
MCV: 89.7 fL (ref 80.0–100.0)
MCV: 89.6 fL (ref 80.0–100.0)
PLATELETS: 263 10*3/uL (ref 150–400)
Platelets: 282 10*3/uL (ref 150–400)
RBC: 3.98 MIL/uL (ref 3.87–5.11)
RBC: 4.13 MIL/uL (ref 3.87–5.11)
RDW: 13 % (ref 11.5–15.5)
RDW: 13.1 % (ref 11.5–15.5)
WBC: 12.3 10*3/uL — ABNORMAL HIGH (ref 4.0–10.5)
WBC: 11.6 10*3/uL — ABNORMAL HIGH (ref 4.0–10.5)
nRBC: 0 % (ref 0.0–0.2)
nRBC: 0 % (ref 0.0–0.2)

## 2017-11-21 LAB — BASIC METABOLIC PANEL
ANION GAP: 10 (ref 5–15)
Anion gap: 10 (ref 5–15)
BUN: 14 mg/dL (ref 8–23)
BUN: 15 mg/dL (ref 8–23)
CALCIUM: 9.2 mg/dL (ref 8.9–10.3)
CALCIUM: 9.1 mg/dL (ref 8.9–10.3)
CO2: 24 mmol/L (ref 22–32)
CO2: 22 mmol/L (ref 22–32)
Chloride: 104 mmol/L (ref 98–111)
Chloride: 104 mmol/L (ref 98–111)
Creatinine, Ser: 0.94 mg/dL (ref 0.44–1.00)
Creatinine, Ser: 0.87 mg/dL (ref 0.44–1.00)
GFR calc Af Amer: >60 mL/min (ref >60)
GFR calc Af Amer: >60 mL/min (ref >60)
GFR calc non Af Amer: 57 mL/min — ABNORMAL LOW (ref >60)
GFR calc non Af Amer: >60 mL/min (ref >60)
GLUCOSE: 117 mg/dL — AB (ref 70–99)
GLUCOSE: 133 mg/dL — AB (ref 70–99)
POTASSIUM: 3.9 mmol/L (ref 3.5–5.1)
Potassium: 3.7 mmol/L (ref 3.5–5.1)
Sodium: 138 mmol/L (ref 135–145)
Sodium: 136 mmol/L (ref 135–145)

## 2017-11-21 LAB — TROPONIN I
TROPONIN I: 3.95 ng/mL — AB (ref <0.03)
TROPONIN I: 9.11 ng/mL — AB (ref <0.03)
Troponin I: 10.46 ng/mL (ref <0.03)

## 2017-11-21 LAB — MRSA PCR SCREENING: MRSA by PCR: NEGATIVE

## 2017-11-21 LAB — BRAIN NATRIURETIC PEPTIDE: B NATRIURETIC PEPTIDE 5: 184.5 pg/mL — AB (ref 0.0–100.0)

## 2017-11-21 MED ORDER — ATORVASTATIN CALCIUM 80 MG PO TABS
80.0000 mg | ORAL_TABLET | Freq: Every day | ORAL | Status: DC
  Administered 2017-11-21 – 2017-11-23 (×3): 80 mg via ORAL
  Filled 2017-11-21 (×3): qty 1

## 2017-11-21 MED ORDER — ONDANSETRON HCL 4 MG/2ML IJ SOLN: 4.0000 mg | Freq: Four times a day (QID) | INTRAMUSCULAR | Status: DC | PRN

## 2017-11-21 MED ORDER — SODIUM CHLORIDE 0.9% FLUSH
3.0000 mL | Freq: Two times a day (BID) | INTRAVENOUS | Status: DC
  Administered 2017-11-21 – 2017-11-23 (×5): 3 mL via INTRAVENOUS

## 2017-11-21 MED ORDER — TICAGRELOR 90 MG PO TABS
ORAL_TABLET | ORAL | Status: DC | PRN
  Administered 2017-11-20: 180 mg via ORAL

## 2017-11-21 MED ORDER — HYDRALAZINE HCL 20 MG/ML IJ SOLN: 5.0000 mg | INTRAMUSCULAR | Status: AC | PRN

## 2017-11-21 MED ORDER — LABETALOL HCL 5 MG/ML IV SOLN: 10.0000 mg | INTRAVENOUS | Status: AC | PRN

## 2017-11-21 MED ORDER — ACETAMINOPHEN 325 MG PO TABS: 650.0000 mg | ORAL_TABLET | ORAL | Status: DC | PRN

## 2017-11-21 MED ORDER — ENOXAPARIN SODIUM 40 MG/0.4ML ~~LOC~~ SOLN
40.0000 mg | SUBCUTANEOUS | Status: DC
  Administered 2017-11-21 – 2017-11-22 (×2): 40 mg via SUBCUTANEOUS
  Filled 2017-11-21 (×2): qty 0.4

## 2017-11-21 MED ORDER — SODIUM CHLORIDE 0.9 % IV SOLN: 250.0000 mL | INTRAVENOUS | Status: DC | PRN

## 2017-11-21 MED ORDER — TICAGRELOR 90 MG PO TABS
90.0000 mg | ORAL_TABLET | Freq: Two times a day (BID) | ORAL | Status: DC
  Administered 2017-11-21 – 2017-11-24 (×7): 90 mg via ORAL
  Filled 2017-11-21 (×7): qty 1

## 2017-11-21 MED ORDER — NITROGLYCERIN 0.4 MG SL SUBL: 0.4000 mg | SUBLINGUAL_TABLET | SUBLINGUAL | Status: DC | PRN

## 2017-11-21 MED ORDER — SODIUM CHLORIDE 0.9% FLUSH: 3.0000 mL | INTRAVENOUS | Status: DC | PRN

## 2017-11-21 MED ORDER — ROSUVASTATIN CALCIUM 20 MG PO TABS: 20.0000 mg | ORAL_TABLET | Freq: Every day | ORAL | Status: DC

## 2017-11-21 MED ORDER — SODIUM CHLORIDE 0.9 % IV SOLN
INTRAVENOUS | Status: AC | PRN
  Administered 2017-11-21: 100 mL/h via INTRAVENOUS

## 2017-11-21 MED ORDER — ONDANSETRON HCL 4 MG/2ML IJ SOLN
4.0000 mg | Freq: Four times a day (QID) | INTRAMUSCULAR | Status: DC | PRN
  Filled 2017-11-21: qty 2

## 2017-11-21 MED ORDER — ASPIRIN EC 81 MG PO TBEC
81.0000 mg | DELAYED_RELEASE_TABLET | Freq: Every day | ORAL | Status: DC
  Administered 2017-11-21 – 2017-11-24 (×3): 81 mg via ORAL
  Filled 2017-11-21 (×3): qty 1

## 2017-11-21 MED ORDER — SODIUM CHLORIDE 0.9 % WEIGHT BASED INFUSION: 1.0000 mL/kg/h | INTRAVENOUS | Status: AC

## 2017-11-21 MED ORDER — CARVEDILOL 3.125 MG PO TABS
3.1250 mg | ORAL_TABLET | Freq: Two times a day (BID) | ORAL | Status: DC
  Administered 2017-11-21 – 2017-11-24 (×6): 3.125 mg via ORAL
  Filled 2017-11-21 (×6): qty 1

## 2017-11-21 MED ORDER — ACETAMINOPHEN 325 MG PO TABS
650.0000 mg | ORAL_TABLET | ORAL | Status: DC | PRN
  Administered 2017-11-21 (×2): 650 mg via ORAL
  Filled 2017-11-21 (×2): qty 2

## 2017-11-21 NOTE — Progress Notes (Addendum)
Patient ID: Melissa Gibbs, female   DOB: 22-Mar-1940, 77 y.o.   MRN: [id number]      PCP-Cardiologist: Sherren Mocha, MD   Subjective:    No further chest pain, no dyspnea.   Objective:   Weight Range: 74.8 kg Body mass index is 30.18 kg/m.   Vital Signs:   Temp:  [97.5 F (36.4 C)-97.9 F (36.6 C)] 97.9 F (36.6 C) (10/19 0800) Pulse Rate:  [49-114] 59 (10/19 0800) Resp:  [10-150] 17 (10/19 0800) BP: (100-173)/(56-98) 116/60 (10/19 0700) SpO2:  [94 %-100 %] 96 % (10/19 0700) Weight:  [74.8 kg] 74.8 kg (10/18 1951) Last BM Date: (PTA)  Weight change: Filed Weights   11/20/17 1951  Weight: 74.8 kg    Intake/Output:   Intake/Output Summary (Last 24 hours) at 11/21/2017 0953 Last data filed at 11/21/2017 0700 Gross per 24 hour  Intake 1006.05 ml  Output -  Net 1006.05 ml      Physical Exam    General:  Well appearing. No resp difficulty HEENT: Normal Neck: Supple. JVP 7. Carotids 2+ bilat; no bruits. No lymphadenopathy or thyromegaly appreciated. Cor: PMI nondisplaced. Regular rate & rhythm. No rubs, gallops or murmurs. Lungs: Clear Abdomen: Soft, nontender, nondistended. No hepatosplenomegaly. No bruits or masses. Good bowel sounds. Extremities: No cyanosis, clubbing, rash, edema Neuro: Alert & orientedx3, cranial nerves grossly intact. moves all 4 extremities w/o difficulty. Affect pleasant   Telemetry   NSR with occasional PVCs, no further atrial fibrillation (personally reviewed)  EKG    NSR with residual slight inferior STE (personally reviewed)  Labs    CBC Recent Labs    11/21/17 0237 11/21/17 0902  WBC 12.3* 11.6*  HGB 10.9* 11.5*  HCT 35.7* 37.0  MCV 89.7 89.6  PLT 282 509   Basic Metabolic Panel Recent Labs    11/20/17 2007  NA 138  K 4.5  CL 101  CO2 25  GLUCOSE 151*  BUN 19  CREATININE 0.82  CALCIUM 9.9   Liver Function Tests No results for input(s): AST, ALT, ALKPHOS, BILITOT, PROT, ALBUMIN in the last 72 hours. No  results for input(s): LIPASE, AMYLASE in the last 72 hours. Cardiac Enzymes Recent Labs    11/20/17 2229 11/21/17 0237  TROPONINI 0.28* 3.95*    BNP: BNP (last 3 results) Recent Labs    11/21/17 0237  BNP 184.5*    ProBNP (last 3 results) No results for input(s): PROBNP in the last 8760 hours.   D-Dimer No results for input(s): DDIMER in the last 72 hours. Hemoglobin A1C No results for input(s): HGBA1C in the last 72 hours. Fasting Lipid Panel Recent Labs    11/20/17 2229  CHOL 196  HDL 50  LDLCALC 127*  TRIG 95  CHOLHDL 3.9   Thyroid Function Tests No results for input(s): TSH, T4TOTAL, T3FREE, THYROIDAB in the last 72 hours.  Invalid input(s): FREET3  Other results:   Imaging    Dg Chest 2 View  Result Date: 11/20/2017 CLINICAL DATA:  Chest pain today. EXAM: CHEST - 2 VIEW COMPARISON:  None. FINDINGS: Lung volumes are low but the lungs are clear. Heart size is normal. No pneumothorax or pleural fluid. No acute or focal bony abnormality. Scattered spondylosis noted. IMPRESSION: No acute disease. Electronically Signed   By: Inge Rise M.D.   On: 11/20/2017 20:23      Medications:     Scheduled Medications: . aspirin EC  81 mg Oral Daily  . atorvastatin  80 mg Oral q1800  .  carvedilol  3.125 mg Oral BID WC  . sodium chloride flush  3 mL Intravenous Q12H  . ticagrelor  90 mg Oral BID     Infusions: . sodium chloride Stopped (11/21/17 0000)  . sodium chloride    . sodium chloride 1 mL/kg/hr (11/21/17 0140)     PRN Medications:  sodium chloride, acetaminophen, nitroGLYCERIN, ondansetron (ZOFRAN) IV, sodium chloride flush   Assessment/Plan   1. CAD: Patient presented with inferoposterior STEMI.  Cath showed occluded proximal LCx, 80% proximal and 80% mid LAD stenoses, mid RCA 60%.  She had DES to proximal LCx.  No chest pain today.  - Plan for staged PCI to proximal/mid LAD Monday morning.  - Continue ASA, ticagrelor, statin.  - Will  add Coreg 3.125 mg bid.  2. Ischemic cardiomyopathy: EF 45% on LV-gram.  Not volume overloaded on exam.  - Add Coreg 3.125 mg bid.  - Echo today.  - Will add RAAS inhibition tomorrow if BP stable.  3. Hyperlipidemia: atorvastatin 80 daily.  4. Atrial fibrillation: Paroxysmal.  Noted only briefly in the setting of acute MI.  If recurs, will need to be anticoagulated.  Follow telemetry.   Length of Stay: 1  Loralie Champagne, MD  11/21/2017, 9:53 AM  Advanced Heart Failure Team Pager [phone] (M-F; 7a - 4p)  Please contact [facility] for night-coverage after hours (4p -7a ) and weekends on [email or web address]

## 2017-11-21 NOTE — Progress Notes (Signed)
CARDIAC REHAB PHASE I   PRE:  Rate/Rhythm: 50 SR  BP:  Sitting: 117/67      SaO2: 98 RA  MODE:  Ambulation: 160 ft   POST:  Rate/Rhythm: 68 SR  BP:  Sitting: 126/72    SaO2: 99 RA   Pt ambulated 124ft in hallway assist of one with gait belt. Pt denied CP or SOB. Pt states she feels much better than before her stent. Encouraged pt to ambulate as able but listen to her body. Pt for staged PCI Monday. Will continue to follow.  [id number] Melissa Falco, RN BSN 11/21/2017 1:38 PM

## 2017-11-22 ENCOUNTER — Other Ambulatory Visit: Payer: Self-pay

## 2017-11-22 ENCOUNTER — Inpatient Hospital Stay (HOSPITAL_COMMUNITY): Payer: Medicare Other

## 2017-11-22 DIAGNOSIS — I5030 Unspecified diastolic (congestive) heart failure: Secondary | ICD-10-CM

## 2017-11-22 DIAGNOSIS — I214 Non-ST elevation (NSTEMI) myocardial infarction: Secondary | ICD-10-CM

## 2017-11-22 LAB — CBC WITH DIFFERENTIAL/PLATELET
ABS IMMATURE GRANULOCYTES: 0.03 10*3/uL (ref 0.00–0.07)
Basophils Absolute: 0 10*3/uL (ref 0.0–0.1)
Basophils Relative: 0 %
Eosinophils Absolute: 0.1 10*3/uL (ref 0.0–0.5)
Eosinophils Relative: 1 %
HCT: 36.7 % (ref 36.0–46.0)
HEMOGLOBIN: 11.3 g/dL — AB (ref 12.0–15.0)
Immature Granulocytes: 0 %
LYMPHS PCT: 23 %
Lymphs Abs: 2.2 10*3/uL (ref 0.7–4.0)
MCH: 27.7 pg (ref 26.0–34.0)
MCHC: 30.8 g/dL (ref 30.0–36.0)
MCV: 90 fL (ref 80.0–100.0)
MONO ABS: 0.8 10*3/uL (ref 0.1–1.0)
MONOS PCT: 9 %
NEUTROS ABS: 6.4 10*3/uL (ref 1.7–7.7)
Neutrophils Relative %: 67 %
Platelets: 271 10*3/uL (ref 150–400)
RBC: 4.08 MIL/uL (ref 3.87–5.11)
RDW: 13.2 % (ref 11.5–15.5)
WBC: 9.5 10*3/uL (ref 4.0–10.5)
nRBC: 0 % (ref 0.0–0.2)

## 2017-11-22 LAB — BASIC METABOLIC PANEL
Anion gap: 7 (ref 5–15)
BUN: 12 mg/dL (ref 8–23)
CHLORIDE: 105 mmol/L (ref 98–111)
CO2: 26 mmol/L (ref 22–32)
Calcium: 8.9 mg/dL (ref 8.9–10.3)
Creatinine, Ser: 0.82 mg/dL (ref 0.44–1.00)
GFR calc Af Amer: >60 mL/min (ref >60)
GFR calc non Af Amer: >60 mL/min (ref >60)
GLUCOSE: 113 mg/dL — AB (ref 70–99)
POTASSIUM: 3.7 mmol/L (ref 3.5–5.1)
Sodium: 138 mmol/L (ref 135–145)

## 2017-11-22 LAB — ECHOCARDIOGRAM COMPLETE
Height: 62 in
WEIGHTICAEL: 2640 [oz_av]

## 2017-11-22 MED ORDER — SODIUM CHLORIDE 0.9% FLUSH
3.0000 mL | Freq: Two times a day (BID) | INTRAVENOUS | Status: DC
  Administered 2017-11-22 – 2017-11-23 (×2): 3 mL via INTRAVENOUS

## 2017-11-22 MED ORDER — SODIUM CHLORIDE 0.9 % WEIGHT BASED INFUSION: 1.0000 mL/kg/h | INTRAVENOUS | Status: DC

## 2017-11-22 MED ORDER — SODIUM CHLORIDE 0.9 % WEIGHT BASED INFUSION
3.0000 mL/kg/h | INTRAVENOUS | Status: DC
  Administered 2017-11-23: 3 mL/kg/h via INTRAVENOUS

## 2017-11-22 MED ORDER — SODIUM CHLORIDE 0.9 % IV SOLN: 250.0000 mL | INTRAVENOUS | Status: DC | PRN

## 2017-11-22 MED ORDER — ALUM & MAG HYDROXIDE-SIMETH 200-200-20 MG/5ML PO SUSP: 30.0000 mL | ORAL | Status: DC | PRN

## 2017-11-22 MED ORDER — LISINOPRIL 2.5 MG PO TABS
2.5000 mg | ORAL_TABLET | Freq: Every day | ORAL | Status: DC
  Administered 2017-11-22 – 2017-11-23 (×2): 2.5 mg via ORAL
  Filled 2017-11-22 (×2): qty 1

## 2017-11-22 MED ORDER — ASPIRIN 81 MG PO CHEW
81.0000 mg | CHEWABLE_TABLET | ORAL | Status: AC
  Administered 2017-11-23: 81 mg via ORAL
  Filled 2017-11-22: qty 1

## 2017-11-22 MED ORDER — SODIUM CHLORIDE 0.9% FLUSH: 3.0000 mL | INTRAVENOUS | Status: DC | PRN

## 2017-11-22 NOTE — Progress Notes (Signed)
Patient ID: Melissa Gibbs, female   DOB: 07/12/1940, 77 y.o.   MRN: [id number]      PCP-Cardiologist: Sherren Mocha, MD   Subjective:    No further chest pain, no dyspnea. Walked in hall yesterday without problems.   Objective:   Weight Range: 74.8 kg Body mass index is 30.18 kg/m.   Vital Signs:   Temp:  [97.7 F (36.5 C)-98.1 F (36.7 C)] 98.1 F (36.7 C) (10/20 0827) Pulse Rate:  [50-69] 64 (10/20 0900) Resp:  [14-25] 22 (10/20 0900) BP: (90-138)/(52-79) 90/52 (10/20 0900) SpO2:  [88 %-99 %] 94 % (10/20 0900) Last BM Date: 11/20/17  Weight change: Filed Weights   11/20/17 1951  Weight: 74.8 kg    Intake/Output:   Intake/Output Summary (Last 24 hours) at 11/22/2017 0929 Last data filed at 11/22/2017 0900 Gross per 24 hour  Intake 1800 ml  Output -  Net 1800 ml      Physical Exam    General: NAD Neck: No JVD, no thyromegaly or thyroid nodule.  Lungs: Clear to auscultation bilaterally with normal respiratory effort. CV: Nondisplaced PMI.  Heart regular S1/S2, no S3/S4, no murmur.  No peripheral edema.   Abdomen: Soft, nontender, no hepatosplenomegaly, no distention.  Skin: Intact without lesions or rashes.  Neurologic: Alert and oriented x 3.  Psych: Normal affect. Extremities: No clubbing or cyanosis.  HEENT: Normal.    Telemetry   NSR in 60s, no atrial fibrillation, 5 beats NSVT (personally reviewed)  Labs    CBC Recent Labs    11/21/17 0902 11/22/17 0254  WBC 11.6* 9.5  NEUTROABS  --  6.4  HGB 11.5* 11.3*  HCT 37.0 36.7  MCV 89.6 90.0  PLT 263 347   Basic Metabolic Panel Recent Labs    11/21/17 1311 11/22/17 0254  NA 136 138  K 3.7 3.7  CL 104 105  CO2 22 26  GLUCOSE 133* 113*  BUN 15 12  CREATININE 0.87 0.82  CALCIUM 9.1 8.9   Liver Function Tests No results for input(s): AST, ALT, ALKPHOS, BILITOT, PROT, ALBUMIN in the last 72 hours. No results for input(s): LIPASE, AMYLASE in the last 72 hours. Cardiac Enzymes Recent  Labs    11/21/17 0237 11/21/17 0902 11/21/17 1311  TROPONINI 3.95* 10.46* 9.11*    BNP: BNP (last 3 results) Recent Labs    11/21/17 0237  BNP 184.5*    ProBNP (last 3 results) No results for input(s): PROBNP in the last 8760 hours.   D-Dimer No results for input(s): DDIMER in the last 72 hours. Hemoglobin A1C No results for input(s): HGBA1C in the last 72 hours. Fasting Lipid Panel Recent Labs    11/20/17 2229  CHOL 196  HDL 50  LDLCALC 127*  TRIG 95  CHOLHDL 3.9   Thyroid Function Tests No results for input(s): TSH, T4TOTAL, T3FREE, THYROIDAB in the last 72 hours.  Invalid input(s): FREET3  Other results:   Imaging    No results found.   Medications:     Scheduled Medications: . aspirin EC  81 mg Oral Daily  . atorvastatin  80 mg Oral q1800  . carvedilol  3.125 mg Oral BID WC  . enoxaparin (LOVENOX) injection  40 mg Subcutaneous Q24H  . lisinopril  2.5 mg Oral Daily  . sodium chloride flush  3 mL Intravenous Q12H  . ticagrelor  90 mg Oral BID    Infusions: . sodium chloride Stopped (11/21/17 0000)  . sodium chloride  PRN Medications: sodium chloride, acetaminophen, nitroGLYCERIN, ondansetron (ZOFRAN) IV, sodium chloride flush   Assessment/Plan   1. CAD: Patient presented with inferoposterior STEMI.  Cath showed occluded proximal LCx, 80% proximal and 80% mid LAD stenoses, mid RCA 60%.  She had DES to proximal LCx.  No chest pain today.  - Plan for staged PCI to proximal/mid LAD Monday morning.  - Continue ASA, ticagrelor, statin.  - Continue Coreg 3.125 mg bid.   2. Ischemic cardiomyopathy: EF 45% on LV-gram.  Not volume overloaded on exam.  - Add lisinopril 2.5 mg daily today (low dose given relatively soft BP).   - Still waiting for echo.  3. Hyperlipidemia: atorvastatin 80 daily.  4. Atrial fibrillation: Paroxysmal.  Noted only briefly in the setting of acute MI, none on telemetry overnight.  If recurs, will need to be  anticoagulated.  Follow telemetry.  5. NSVT: Short run, within 48 hrs of MI. Continue Coreg.   Length of Stay: 2  Loralie Champagne, MD  11/22/2017, 9:29 AM  Advanced Heart Failure Team Pager [phone] (M-F; 7a - 4p)  Please contact [facility] for night-coverage after hours (4p -7a ) and weekends on [email or web address]

## 2017-11-22 NOTE — Plan of Care (Signed)

## 2017-11-22 NOTE — Plan of Care (Signed)
  Problem: Education: Goal: Knowledge of General Education information will improve Description Including pain rating scale, medication(s)/side effects and non-pharmacologic comfort measures 11/22/2017 1019 by Hillard Danker, RN Outcome: Progressing 11/22/2017 0834 by Hillard Danker, RN Outcome: Progressing   Problem: Health Behavior/Discharge Planning: Goal: Ability to manage health-related needs will improve 11/22/2017 1019 by Hillard Danker, RN Outcome: Progressing 11/22/2017 0834 by Hillard Danker, RN Outcome: Progressing   Problem: Clinical Measurements: Goal: Ability to maintain clinical measurements within normal limits will improve 11/22/2017 1019 by Hillard Danker, RN Outcome: Progressing 11/22/2017 0834 by Hillard Danker, RN Outcome: Progressing Goal: Will remain free from infection Outcome: Progressing Goal: Diagnostic test results will improve Outcome: Progressing Goal: Respiratory complications will improve Outcome: Progressing Goal: Cardiovascular complication will be avoided Outcome: Progressing   Problem: Activity: Goal: Risk for activity intolerance will decrease Outcome: Progressing   Problem: Nutrition: Goal: Adequate nutrition will be maintained Outcome: Progressing   Problem: Coping: Goal: Level of anxiety will decrease Outcome: Progressing   Problem: Elimination: Goal: Will not experience complications related to bowel motility Outcome: Progressing Goal: Will not experience complications related to urinary retention Outcome: Progressing   Problem: Pain Managment: Goal: General experience of comfort will improve Outcome: Progressing   Problem: Safety: Goal: Ability to remain free from injury will improve Outcome: Progressing   Problem: Skin Integrity: Goal: Risk for impaired skin integrity will decrease Outcome: Progressing

## 2017-11-22 NOTE — Progress Notes (Signed)
  Echocardiogram 2D Echocardiogram has been performed.  Taylorann Tkach G Ladrea Holladay 11/22/2017, 12:05 PM

## 2017-11-22 NOTE — Plan of Care (Signed)
Had prior cath 10/18 and does not need the educational video

## 2017-11-23 ENCOUNTER — Encounter (HOSPITAL_COMMUNITY): Payer: Self-pay | Admitting: Cardiovascular Disease

## 2017-11-23 ENCOUNTER — Encounter (HOSPITAL_COMMUNITY)
Admission: EM | Disposition: A | Discharge status: Home / Self Care | Payer: Self-pay | Source: Ambulatory Visit | Attending: Cardiovascular Disease

## 2017-11-23 DIAGNOSIS — I2119 ST elevation (STEMI) myocardial infarction involving other coronary artery of inferior wall: Secondary | ICD-10-CM | POA: Insufficient documentation

## 2017-11-23 DIAGNOSIS — I2510 Atherosclerotic heart disease of native coronary artery without angina pectoris: Secondary | ICD-10-CM

## 2017-11-23 DIAGNOSIS — I25110 Atherosclerotic heart disease of native coronary artery with unstable angina pectoris: Secondary | ICD-10-CM

## 2017-11-23 HISTORY — PX: CORONARY STENT INTERVENTION: CATH118234

## 2017-11-23 LAB — BASIC METABOLIC PANEL
Anion gap: 8 (ref 5–15)
BUN: 13 mg/dL (ref 8–23)
CHLORIDE: 105 mmol/L (ref 98–111)
CO2: 26 mmol/L (ref 22–32)
Calcium: 8.7 mg/dL — ABNORMAL LOW (ref 8.9–10.3)
Creatinine, Ser: 0.91 mg/dL (ref 0.44–1.00)
GFR calc Af Amer: >60 mL/min (ref >60)
GFR calc non Af Amer: 59 mL/min — ABNORMAL LOW (ref >60)
Glucose, Bld: 114 mg/dL — ABNORMAL HIGH (ref 70–99)
Potassium: 3.7 mmol/L (ref 3.5–5.1)
SODIUM: 139 mmol/L (ref 135–145)

## 2017-11-23 LAB — CBC WITH DIFFERENTIAL/PLATELET
Abs Immature Granulocytes: 0.03 10*3/uL (ref 0.00–0.07)
Basophils Absolute: 0 10*3/uL (ref 0.0–0.1)
Basophils Relative: 0 %
EOS PCT: 1 %
Eosinophils Absolute: 0.1 10*3/uL (ref 0.0–0.5)
HEMATOCRIT: 35 % — AB (ref 36.0–46.0)
Hemoglobin: 11 g/dL — ABNORMAL LOW (ref 12.0–15.0)
Immature Granulocytes: 0 %
LYMPHS ABS: 1.9 10*3/uL (ref 0.7–4.0)
LYMPHS PCT: 18 %
MCH: 28.3 pg (ref 26.0–34.0)
MCHC: 31.4 g/dL (ref 30.0–36.0)
MCV: 90 fL (ref 80.0–100.0)
MONO ABS: 0.9 10*3/uL (ref 0.1–1.0)
MONOS PCT: 8 %
Neutro Abs: 7.8 10*3/uL — ABNORMAL HIGH (ref 1.7–7.7)
Neutrophils Relative %: 73 %
Platelets: 244 10*3/uL (ref 150–400)
RBC: 3.89 MIL/uL (ref 3.87–5.11)
RDW: 13.4 % (ref 11.5–15.5)
WBC: 10.8 10*3/uL — ABNORMAL HIGH (ref 4.0–10.5)
nRBC: 0 % (ref 0.0–0.2)

## 2017-11-23 LAB — POCT ACTIVATED CLOTTING TIME
Activated Clotting Time: 406 seconds
Activated Clotting Time: 488 seconds

## 2017-11-23 SURGERY — CORONARY STENT INTERVENTION
Anesthesia: LOCAL

## 2017-11-23 MED ORDER — LOSARTAN POTASSIUM 25 MG PO TABS
12.5000 mg | ORAL_TABLET | Freq: Every day | ORAL | Status: DC
  Administered 2017-11-24: 08:00:00 12.5 mg via ORAL
  Filled 2017-11-23: qty 0.5

## 2017-11-23 MED ORDER — FENTANYL CITRATE (PF) 100 MCG/2ML IJ SOLN
INTRAMUSCULAR | Status: DC | PRN
  Administered 2017-11-23: 50 ug via INTRAVENOUS
  Administered 2017-11-23: 25 ug via INTRAVENOUS

## 2017-11-23 MED ORDER — MIDAZOLAM HCL 2 MG/2ML IJ SOLN
INTRAMUSCULAR | Status: AC
  Filled 2017-11-23: qty 2

## 2017-11-23 MED ORDER — SODIUM CHLORIDE 0.9 % IV SOLN
INTRAVENOUS | Status: AC
  Administered 2017-11-23: 13:00:00 via INTRAVENOUS

## 2017-11-23 MED ORDER — VERAPAMIL HCL 2.5 MG/ML IV SOLN
INTRAVENOUS | Status: DC | PRN
  Administered 2017-11-23: 11:00:00 via INTRA_ARTERIAL

## 2017-11-23 MED ORDER — IOHEXOL 350 MG/ML SOLN
INTRAVENOUS | Status: DC | PRN
  Administered 2017-11-23: 90 mL via INTRACARDIAC

## 2017-11-23 MED ORDER — SODIUM CHLORIDE 0.9 % IV SOLN: 250.0000 mL | INTRAVENOUS | Status: DC | PRN

## 2017-11-23 MED ORDER — LIDOCAINE HCL (PF) 1 % IJ SOLN
INTRAMUSCULAR | Status: DC | PRN
  Administered 2017-11-23: 2 mL

## 2017-11-23 MED ORDER — LABETALOL HCL 5 MG/ML IV SOLN: 10.0000 mg | INTRAVENOUS | Status: AC | PRN

## 2017-11-23 MED ORDER — HEPARIN SODIUM (PORCINE) 1000 UNIT/ML IJ SOLN
INTRAMUSCULAR | Status: DC | PRN
  Administered 2017-11-23: 10000 [IU] via INTRAVENOUS

## 2017-11-23 MED ORDER — SODIUM CHLORIDE 0.9% FLUSH
3.0000 mL | Freq: Two times a day (BID) | INTRAVENOUS | Status: DC
  Administered 2017-11-23 – 2017-11-24 (×2): 3 mL via INTRAVENOUS

## 2017-11-23 MED ORDER — HEPARIN SODIUM (PORCINE) 1000 UNIT/ML IJ SOLN
INTRAMUSCULAR | Status: AC
  Filled 2017-11-23: qty 1

## 2017-11-23 MED ORDER — HEPARIN (PORCINE) IN NACL 1000-0.9 UT/500ML-% IV SOLN
INTRAVENOUS | Status: DC | PRN
  Administered 2017-11-23 (×3): 500 mL

## 2017-11-23 MED ORDER — SODIUM CHLORIDE 0.9% FLUSH: 3.0000 mL | INTRAVENOUS | Status: DC | PRN

## 2017-11-23 MED ORDER — LIDOCAINE HCL (PF) 1 % IJ SOLN
INTRAMUSCULAR | Status: AC
  Filled 2017-11-23: qty 30

## 2017-11-23 MED ORDER — MIDAZOLAM HCL 2 MG/2ML IJ SOLN
INTRAMUSCULAR | Status: DC | PRN
  Administered 2017-11-23: 1 mg via INTRAVENOUS

## 2017-11-23 MED ORDER — HYDRALAZINE HCL 20 MG/ML IJ SOLN: 5.0000 mg | INTRAMUSCULAR | Status: AC | PRN

## 2017-11-23 MED ORDER — FENTANYL CITRATE (PF) 100 MCG/2ML IJ SOLN
INTRAMUSCULAR | Status: AC
  Filled 2017-11-23: qty 2

## 2017-11-23 MED ORDER — HEPARIN (PORCINE) IN NACL 1000-0.9 UT/500ML-% IV SOLN
INTRAVENOUS | Status: AC
  Filled 2017-11-23: qty 1000

## 2017-11-23 MED ORDER — ANGIOPLASTY BOOK
Freq: Once | Status: AC
  Administered 2017-11-23: 23:00:00
  Filled 2017-11-23: qty 1

## 2017-11-23 MED ORDER — VERAPAMIL HCL 2.5 MG/ML IV SOLN
INTRAVENOUS | Status: AC
  Filled 2017-11-23: qty 2

## 2017-11-23 SURGICAL SUPPLY — 17 items
BALLN SAPPHIRE 2.0X15 (BALLOONS) ×2
BALLN SAPPHIRE ~~LOC~~ 3.0X10 (BALLOONS) ×1 IMPLANT
BALLOON SAPPHIRE 2.0X15 (BALLOONS) IMPLANT
CATH VISTA GUIDE 6FR XBLAD3.5 (CATHETERS) ×1 IMPLANT
DEVICE RAD COMP TR BAND LRG (VASCULAR PRODUCTS) ×1 IMPLANT
ELECT DEFIB PAD ADLT CADENCE (PAD) ×1 IMPLANT
GLIDESHEATH SLEND SS 6F .021 (SHEATH) ×1 IMPLANT
GUIDEWIRE INQWIRE 1.5J.035X260 (WIRE) IMPLANT
INQWIRE 1.5J .035X260CM (WIRE) ×2
KIT ENCORE 26 ADVANTAGE (KITS) ×1 IMPLANT
KIT HEART LEFT (KITS) ×2 IMPLANT
PACK CARDIAC CATHETERIZATION (CUSTOM PROCEDURE TRAY) ×2 IMPLANT
STENT SIERRA 2.75 X 18 MM (Permanent Stent) ×1 IMPLANT
TRANSDUCER W/STOPCOCK (MISCELLANEOUS) ×2 IMPLANT
TUBING CIL FLEX 10 FLL-RA (TUBING) ×2 IMPLANT
WIRE COUGAR XT STRL 190CM (WIRE) ×1 IMPLANT
WIRE HI TORQ VERSACORE-J 145CM (WIRE) ×1 IMPLANT

## 2017-11-23 NOTE — H&P (View-Only) (Signed)
Progress Note  Patient Name: Melissa Gibbs Date of Encounter: 11/23/2017  Primary Cardiologist: Sherren Mocha, MD  Subjective   Melissa Gibbs is feeling okay this morning. She denies chest pain. No SOB, tolerated walking with cardiac rehab over the weekend.  Inpatient Medications    Scheduled Meds: . aspirin EC  81 mg Oral Daily  . atorvastatin  80 mg Oral q1800  . carvedilol  3.125 mg Oral BID WC  . enoxaparin (LOVENOX) injection  40 mg Subcutaneous Q24H  . lisinopril  2.5 mg Oral Daily  . sodium chloride flush  3 mL Intravenous Q12H  . sodium chloride flush  3 mL Intravenous Q12H  . ticagrelor  90 mg Oral BID   Continuous Infusions: . sodium chloride Stopped (11/21/17 0000)  . sodium chloride    . sodium chloride    . sodium chloride 1 mL/kg/hr (11/23/17 0523)   PRN Meds: sodium chloride, sodium chloride, acetaminophen, alum & mag hydroxide-simeth, nitroGLYCERIN, ondansetron (ZOFRAN) IV, sodium chloride flush, sodium chloride flush   Vital Signs    Vitals:   11/23/17 0600 11/23/17 0700 11/23/17 0747 11/23/17 0800  BP: 114/68 122/62  121/74  Pulse: (!) 51 (!) 55  (!) 55  Resp: 16 19  18   Temp:   98.2 F (36.8 C)   TempSrc:   Oral   SpO2: 100% 95%  96%  Weight:      Height:        Intake/Output Summary (Last 24 hours) at 11/23/2017 0912 Last data filed at 11/23/2017 2355 Gross per 24 hour  Intake 1231.8 ml  Output -  Net 1231.8 ml   Filed Weights   11/20/17 1951  Weight: 74.8 kg    Telemetry    NSR, Few PVCs, 1x 5 beat NSVT - Personally Reviewed  ECG    None Today - Personally Reviewed  Physical Exam   GEN: No acute distress.   Neck: No JVD Cardiac: RRR, no murmurs, rubs, or gallops.  Respiratory: Clear to auscultation bilaterally. GI: Soft, nontender, non-distended; Mild bruising R-wrist; R groin site clean and dry, minimal brusing MS: No edema; No deformity. Neuro:  Nonfocal  Psych: Normal affect   Labs    Chemistry Recent Labs    Lab 11/21/17 1311 11/22/17 0254 11/23/17 0234  NA 136 138 139  K 3.7 3.7 3.7  CL 104 105 105  CO2 22 26 26   GLUCOSE 133* 113* 114*  BUN 15 12 13   CREATININE 0.87 0.82 0.91  CALCIUM 9.1 8.9 8.7*  GFRNONAA >60 >60 59*  GFRAA >60 >60 >60  ANIONGAP 10 7 8      Hematology Recent Labs  Lab 11/21/17 0902 11/22/17 0254 11/23/17 0234  WBC 11.6* 9.5 10.8*  RBC 4.13 4.08 3.89  HGB 11.5* 11.3* 11.0*  HCT 37.0 36.7 35.0*  MCV 89.6 90.0 90.0  MCH 27.8 27.7 28.3  MCHC 31.1 30.8 31.4  RDW 13.1 13.2 13.4  PLT 263 271 244    Cardiac Enzymes Recent Labs  Lab 11/20/17 2229 11/21/17 0237 11/21/17 0902 11/21/17 1311  TROPONINI 0.28* 3.95* 10.46* 9.11*    Recent Labs  Lab 11/20/17 2015  TROPIPOC 0.09*     BNP Recent Labs  Lab 11/21/17 0237  BNP 184.5*     DDimer No results for input(s): DDIMER in the last 168 hours.   Radiology    No results found.  Cardiac Studies   Cath 11/20/2017  There is moderate left ventricular systolic dysfunction.  LV end diastolic pressure  is mildly elevated.  Ost Cx to Prox Cx lesion is 100% stenosed.  Prox LAD lesion is 80% stenosed.  Mid LAD lesion is 80% stenosed.  Prox RCA lesion is 60% stenosed.  A drug-eluting stent was successfully placed using a STENT SIERRA 4.00 X 15 MM.  Post intervention, there is a 0% residual stenosis.   1.  Acute inferoposterior MI secondary to acute occlusion of the proximal circumflex, treated successfully with PCI using a 4.0 x 15 mm Xience Sierra DES 2.  Severe proximal LAD stenosis and moderately severe mid to distal LAD stenosis 3.  Moderate RCA stenosis with a 60% lesion in the proximal to mid vessel 4.  Moderate segmental LV systolic dysfunction and a circumflex infarct pattern, LVEF estimated at 45%  Echo 11/22/2017 Study Conclusions - Left ventricle: The cavity size was normal. Systolic function was   moderately reduced. The estimated ejection fraction was in the   range of 35% to  40%. There is hypokinesis of the basal and mid   inferolateral, inferior and apical inferior and lateral walls.   Doppler parameters are consistent with abnormal left ventricular   relaxation (grade 1 diastolic dysfunction). Doppler parameters   are consistent with elevated ventricular end-diastolic filling   pressure. - Aortic valve: There was mild regurgitation. - Mitral valve: There was trivial regurgitation. - Left atrium: The atrium was mildly dilated. - Right ventricle: Systolic function was normal. - Right atrium: The atrium was normal in size. - Tricuspid valve: There was mild regurgitation. - Pulmonic valve: There was mild regurgitation. - Pulmonary arteries: Systolic pressure was mildly increased. PA   peak pressure: 33 mm Hg (S). - Inferior vena cava: The vessel was normal in size. - Pericardium, extracardiac: There was no pericardial effusion.  Patient Profile     77 y.o. female presented with with inferoposterior STEMI. Cath showed 100% occluded Prox LCx, 80% stenosis of proximal and mid LAD' and 60% RCA. LCx treated with DES to 0% residual stenosis. Intervention on LAD planned for 10/21.  Assessment & Plan    1) STEMI: Presented with inferoposterior STEMI. Cath showed 100% occluded Prox LCx, which was treated with DES to 0% residual occlusion. 80% stenosis of proximal and mid LAD; and 60% RCA also noted. R-radial had to be aborted in favor of R Groin 2/2 occlusion of mid R radial artery. Troponin peaked at 11. Former smoker (quit at 77 yrs old). LAD intervention planned for today. Remains chest pain free. - Cath today - DAPT (ASA/Brilinta) - Coreg 3.125 BID - Atorvastatin 80mg  Daily - Will switch Lisinopril to low dose losartan as she was taking this at home and reports cough with a medicaiton in the past   2) Ischemic Cardiomyopathy: EF 35-40% on Echo in the setting of acute MI. Euvolemic on exam. Will need follow up Echo in 2-3 months. BP has been soft to normo-tensive,  will titrate meds as tolerated. - Continue with Coreg 3.125 BID, Lisinopril 2.5mg  Daily  3) HLD: LDL 127. Atorvastatin as above. 4) PAF: Peri infarct A.fib. Sinus rhythm on telemetry today. No need for anticoagulation unless this recurs. 5) PAD: R-radial artery noted to be occluded during cath. On DAPT for DES.  For questions or updates, please contact [facility] Please consult [email or web address] for contact info under   Signed, Neva Seat, MD  11/23/2017, 9:12 AM    Patient seen, examined. Available data reviewed. Agree with findings, assessment, and plan as outlined by Dr Trilby Drummer.  The patient is independently interviewed  and examined.  She is alert, oriented, in no distress.  Lung fields are clear, JVP is normal, heart is regular rate and rhythm with no murmur gallop, the right groin site is clear, extremities show no edema.  Plans noted for staged PCI of the LAD today.  Reviewed with patient.  She understands risks, indications, and alternatives.  The patient understands and agrees to proceed.  From an access perspective, she had good femoral access previously.  The right radial was not accessible.  We did experience failure of the first Perclose device with right femoral access but the second device worked appropriately.  Sherren Mocha, M.D. 11/23/2017 10:09 AM

## 2017-11-23 NOTE — Interval H&P Note (Signed)
History and Physical Interval Note:  11/23/2017 11:12 AM  Melissa Gibbs  has presented today for percutaneous coronary intervention with the diagnosis of NSTEMI/staged PCI of the LAD and possibly the RCA. She had an inferolateral STEMI over the weekend and a stent was placed in the occluded Circumflex artery. The various methods of treatment have been discussed with the patient and family. After consideration of risks, benefits and other options for treatment, the patient has consented to  Procedure(s): CORONARY STENT INTERVENTION (N/A) as a surgical intervention .  The patient's history has been reviewed, patient examined, no change in status, stable for surgery.  I have reviewed the patient's chart and labs.  Questions were answered to the patient's satisfaction.    Cath Lab Visit (complete for each Cath Lab visit)  Clinical Evaluation Leading to the Procedure:   ACS: Yes.    Non-ACS:    Anginal Classification: CCS III  Anti-ischemic medical therapy: Minimal Therapy (1 class of medications)  Non-Invasive Test Results: No non-invasive testing performed  Prior CABG: No previous CABG         Lauree Chandler

## 2017-11-23 NOTE — Progress Notes (Addendum)
Progress Note  Patient Name: Melissa Gibbs Date of Encounter: 11/23/2017  Primary Cardiologist: Sherren Mocha, MD  Subjective   Melissa Gibbs is feeling okay this morning. She denies chest pain. No SOB, tolerated walking with cardiac rehab over the weekend.  Inpatient Medications    Scheduled Meds: . aspirin EC  81 mg Oral Daily  . atorvastatin  80 mg Oral q1800  . carvedilol  3.125 mg Oral BID WC  . enoxaparin (LOVENOX) injection  40 mg Subcutaneous Q24H  . lisinopril  2.5 mg Oral Daily  . sodium chloride flush  3 mL Intravenous Q12H  . sodium chloride flush  3 mL Intravenous Q12H  . ticagrelor  90 mg Oral BID   Continuous Infusions: . sodium chloride Stopped (11/21/17 0000)  . sodium chloride    . sodium chloride    . sodium chloride 1 mL/kg/hr (11/23/17 0523)   PRN Meds: sodium chloride, sodium chloride, acetaminophen, alum & mag hydroxide-simeth, nitroGLYCERIN, ondansetron (ZOFRAN) IV, sodium chloride flush, sodium chloride flush   Vital Signs    Vitals:   11/23/17 0600 11/23/17 0700 11/23/17 0747 11/23/17 0800  BP: 114/68 122/62  121/74  Pulse: (!) 51 (!) 55  (!) 55  Resp: 16 19  18   Temp:   98.2 F (36.8 C)   TempSrc:   Oral   SpO2: 100% 95%  96%  Weight:      Height:        Intake/Output Summary (Last 24 hours) at 11/23/2017 0912 Last data filed at 11/23/2017 1610 Gross per 24 hour  Intake 1231.8 ml  Output -  Net 1231.8 ml   Filed Weights   11/20/17 1951  Weight: 74.8 kg    Telemetry    NSR, Few PVCs, 1x 5 beat NSVT - Personally Reviewed  ECG    None Today - Personally Reviewed  Physical Exam   GEN: No acute distress.   Neck: No JVD Cardiac: RRR, no murmurs, rubs, or gallops.  Respiratory: Clear to auscultation bilaterally. GI: Soft, nontender, non-distended; Mild bruising R-wrist; R groin site clean and dry, minimal brusing MS: No edema; No deformity. Neuro:  Nonfocal  Psych: Normal affect   Labs    Chemistry Recent Labs    Lab 11/21/17 1311 11/22/17 0254 11/23/17 0234  NA 136 138 139  K 3.7 3.7 3.7  CL 104 105 105  CO2 22 26 26   GLUCOSE 133* 113* 114*  BUN 15 12 13   CREATININE 0.87 0.82 0.91  CALCIUM 9.1 8.9 8.7*  GFRNONAA >60 >60 59*  GFRAA >60 >60 >60  ANIONGAP 10 7 8      Hematology Recent Labs  Lab 11/21/17 0902 11/22/17 0254 11/23/17 0234  WBC 11.6* 9.5 10.8*  RBC 4.13 4.08 3.89  HGB 11.5* 11.3* 11.0*  HCT 37.0 36.7 35.0*  MCV 89.6 90.0 90.0  MCH 27.8 27.7 28.3  MCHC 31.1 30.8 31.4  RDW 13.1 13.2 13.4  PLT 263 271 244    Cardiac Enzymes Recent Labs  Lab 11/20/17 2229 11/21/17 0237 11/21/17 0902 11/21/17 1311  TROPONINI 0.28* 3.95* 10.46* 9.11*    Recent Labs  Lab 11/20/17 2015  TROPIPOC 0.09*     BNP Recent Labs  Lab 11/21/17 0237  BNP 184.5*     DDimer No results for input(s): DDIMER in the last 168 hours.   Radiology    No results found.  Cardiac Studies   Cath 11/20/2017  There is moderate left ventricular systolic dysfunction.  LV end diastolic pressure  is mildly elevated.  Ost Cx to Prox Cx lesion is 100% stenosed.  Prox LAD lesion is 80% stenosed.  Mid LAD lesion is 80% stenosed.  Prox RCA lesion is 60% stenosed.  A drug-eluting stent was successfully placed using a STENT SIERRA 4.00 X 15 MM.  Post intervention, there is a 0% residual stenosis.   1.  Acute inferoposterior MI secondary to acute occlusion of the proximal circumflex, treated successfully with PCI using a 4.0 x 15 mm Xience Sierra DES 2.  Severe proximal LAD stenosis and moderately severe mid to distal LAD stenosis 3.  Moderate RCA stenosis with a 60% lesion in the proximal to mid vessel 4.  Moderate segmental LV systolic dysfunction and a circumflex infarct pattern, LVEF estimated at 45%  Echo 11/22/2017 Study Conclusions - Left ventricle: The cavity size was normal. Systolic function was   moderately reduced. The estimated ejection fraction was in the   range of 35% to  40%. There is hypokinesis of the basal and mid   inferolateral, inferior and apical inferior and lateral walls.   Doppler parameters are consistent with abnormal left ventricular   relaxation (grade 1 diastolic dysfunction). Doppler parameters   are consistent with elevated ventricular end-diastolic filling   pressure. - Aortic valve: There was mild regurgitation. - Mitral valve: There was trivial regurgitation. - Left atrium: The atrium was mildly dilated. - Right ventricle: Systolic function was normal. - Right atrium: The atrium was normal in size. - Tricuspid valve: There was mild regurgitation. - Pulmonic valve: There was mild regurgitation. - Pulmonary arteries: Systolic pressure was mildly increased. PA   peak pressure: 33 mm Hg (S). - Inferior vena cava: The vessel was normal in size. - Pericardium, extracardiac: There was no pericardial effusion.  Patient Profile     77 y.o. female presented with with inferoposterior STEMI. Cath showed 100% occluded Prox LCx, 80% stenosis of proximal and mid LAD' and 60% RCA. LCx treated with DES to 0% residual stenosis. Intervention on LAD planned for 10/21.  Assessment & Plan    1) STEMI: Presented with inferoposterior STEMI. Cath showed 100% occluded Prox LCx, which was treated with DES to 0% residual occlusion. 80% stenosis of proximal and mid LAD; and 60% RCA also noted. R-radial had to be aborted in favor of R Groin 2/2 occlusion of mid R radial artery. Troponin peaked at 11. Former smoker (quit at 77 yrs old). LAD intervention planned for today. Remains chest pain free. - Cath today - DAPT (ASA/Brilinta) - Coreg 3.125 BID - Atorvastatin 80mg  Daily - Will switch Lisinopril to low dose losartan as she was taking this at home and reports cough with a medicaiton in the past   2) Ischemic Cardiomyopathy: EF 35-40% on Echo in the setting of acute MI. Euvolemic on exam. Will need follow up Echo in 2-3 months. BP has been soft to normo-tensive,  will titrate meds as tolerated. - Continue with Coreg 3.125 BID, Lisinopril 2.5mg  Daily  3) HLD: LDL 127. Atorvastatin as above. 4) PAF: Peri infarct A.fib. Sinus rhythm on telemetry today. No need for anticoagulation unless this recurs. 5) PAD: R-radial artery noted to be occluded during cath. On DAPT for DES.  For questions or updates, please contact [place] Please consult [email or web address] for contact info under   Signed, Neva Seat, MD  11/23/2017, 9:12 AM    Patient seen, examined. Available data reviewed. Agree with findings, assessment, and plan as outlined by Dr Trilby Drummer.  The patient is independently interviewed  and examined.  She is alert, oriented, in no distress.  Lung fields are clear, JVP is normal, heart is regular rate and rhythm with no murmur gallop, the right groin site is clear, extremities show no edema.  Plans noted for staged PCI of the LAD today.  Reviewed with patient.  She understands risks, indications, and alternatives.  The patient understands and agrees to proceed.  From an access perspective, she had good femoral access previously.  The right radial was not accessible.  We did experience failure of the first Perclose device with right femoral access but the second device worked appropriately.  Sherren Mocha, M.D. 11/23/2017 10:09 AM

## 2017-11-23 NOTE — Progress Notes (Signed)
CARDIAC REHAB PHASE I   Offered to walk with pt, pt declining at this time. Pts for cath today. Will continue to follow.  Rufina Falco, RN BSN 11/23/2017 8:51 AM

## 2017-11-24 ENCOUNTER — Encounter (HOSPITAL_COMMUNITY): Payer: Self-pay | Admitting: Student

## 2017-11-24 ENCOUNTER — Telehealth: Payer: Self-pay | Admitting: Cardiovascular Disease

## 2017-11-24 DIAGNOSIS — I739 Peripheral vascular disease, unspecified: Secondary | ICD-10-CM

## 2017-11-24 DIAGNOSIS — I480 Paroxysmal atrial fibrillation: Secondary | ICD-10-CM

## 2017-11-24 DIAGNOSIS — I255 Ischemic cardiomyopathy: Secondary | ICD-10-CM

## 2017-11-24 LAB — CBC WITH DIFFERENTIAL/PLATELET
ABS IMMATURE GRANULOCYTES: 0.04 10*3/uL (ref 0.00–0.07)
BASOS PCT: 0 %
Basophils Absolute: 0 10*3/uL (ref 0.0–0.1)
EOS ABS: 0.1 10*3/uL (ref 0.0–0.5)
Eosinophils Relative: 1 %
HCT: 34.3 % — ABNORMAL LOW (ref 36.0–46.0)
Hemoglobin: 10.7 g/dL — ABNORMAL LOW (ref 12.0–15.0)
Immature Granulocytes: 0 %
Lymphocytes Relative: 15 %
Lymphs Abs: 1.6 10*3/uL (ref 0.7–4.0)
MCH: 28.1 pg (ref 26.0–34.0)
MCHC: 31.2 g/dL (ref 30.0–36.0)
MCV: 90 fL (ref 80.0–100.0)
MONO ABS: 0.9 10*3/uL (ref 0.1–1.0)
MONOS PCT: 9 %
NEUTROS ABS: 7.9 10*3/uL — AB (ref 1.7–7.7)
Neutrophils Relative %: 75 %
PLATELETS: 250 10*3/uL (ref 150–400)
RBC: 3.81 MIL/uL — AB (ref 3.87–5.11)
RDW: 13.4 % (ref 11.5–15.5)
WBC: 10.6 10*3/uL — AB (ref 4.0–10.5)
nRBC: 0 % (ref 0.0–0.2)

## 2017-11-24 LAB — BASIC METABOLIC PANEL
ANION GAP: 7 (ref 5–15)
BUN: 11 mg/dL (ref 8–23)
CALCIUM: 8.7 mg/dL — AB (ref 8.9–10.3)
CO2: 24 mmol/L (ref 22–32)
Chloride: 107 mmol/L (ref 98–111)
Creatinine, Ser: 0.87 mg/dL (ref 0.44–1.00)
GFR calc Af Amer: >60 mL/min (ref >60)
GLUCOSE: 94 mg/dL (ref 70–99)
POTASSIUM: 3.3 mmol/L — AB (ref 3.5–5.1)
SODIUM: 138 mmol/L (ref 135–145)

## 2017-11-24 MED ORDER — LOSARTAN POTASSIUM 25 MG PO TABS: 12.5000 mg | ORAL_TABLET | Freq: Every day | ORAL | 3 refills | Status: DC

## 2017-11-24 MED ORDER — CARVEDILOL 3.125 MG PO TABS: 3.1250 mg | ORAL_TABLET | Freq: Two times a day (BID) | ORAL | 3 refills | Status: DC

## 2017-11-24 MED ORDER — ASPIRIN 81 MG PO TBEC: 81.0000 mg | DELAYED_RELEASE_TABLET | Freq: Every day | ORAL | Status: DC

## 2017-11-24 MED ORDER — ATORVASTATIN CALCIUM 80 MG PO TABS: 80.0000 mg | ORAL_TABLET | Freq: Every day | ORAL | 3 refills | Status: DC

## 2017-11-24 MED ORDER — POTASSIUM CHLORIDE CRYS ER 20 MEQ PO TBCR
40.0000 meq | EXTENDED_RELEASE_TABLET | Freq: Three times a day (TID) | ORAL | Status: DC
  Administered 2017-11-24: 40 meq via ORAL
  Filled 2017-11-24: qty 2

## 2017-11-24 MED ORDER — NITROGLYCERIN 0.4 MG SL SUBL: 0.4000 mg | SUBLINGUAL_TABLET | SUBLINGUAL | 12 refills | Status: DC | PRN

## 2017-11-24 MED ORDER — TICAGRELOR 90 MG PO TABS: 90.0000 mg | ORAL_TABLET | Freq: Two times a day (BID) | ORAL | 6 refills | Status: DC

## 2017-11-24 MED ORDER — POTASSIUM CHLORIDE CRYS ER 20 MEQ PO TBCR
20.0000 meq | EXTENDED_RELEASE_TABLET | Freq: Once | ORAL | Status: AC
  Administered 2017-11-24: 20 meq via ORAL
  Filled 2017-11-24: qty 1

## 2017-11-24 MED FILL — BRILINTA 90 MG TABLET: 90 | 30 days supply | Qty: 60 | Fill #0 | Status: TO

## 2017-11-24 MED FILL — ATORVASTATIN CALCIUM 80 MG: 80 | 30 days supply | Qty: 30 | Fill #0 | Status: TO

## 2017-11-24 MED FILL — LOSARTAN POTASSIUM 25 MG TA: 25 | 30 days supply | Qty: 15 | Fill #0

## 2017-11-24 MED FILL — CARVEDILOL 3.125 MG TABLET: 3.125 | 30 days supply | Qty: 60 | Fill #0 | Status: TO

## 2017-11-24 MED FILL — NITROGLYCERIN 0.4 MG TAB SL: 0.4 | 25 days supply | Qty: 25 | Fill #0

## 2017-11-24 NOTE — Telephone Encounter (Signed)
The pt is being discharged from the hospital today. We will call her tomorrow. 

## 2017-11-24 NOTE — Care Management Note (Signed)
Case Management Note  Patient Details  Name: Melissa Gibbs MRN: [id number] Date of Birth: December 13, 1940  Subjective/Objective:    From home alone, pta indep, s/p stent intervention ,will be on brilinta, NCM awaiting benefit check .  RN will give patient the 30 day  Free coupon. NCM informed patient that awaiting benefit check for refill cost.  NCM will call her if do not have prior to dc.  Her home phone is [phone].                 Action/Plan: DC home when medically ready.   Expected Discharge Date:                  Expected Discharge Plan:  Home/Self Care  In-House Referral:     Discharge planning Services  CM Consult, Medication Assistance  Post Acute Care Choice:    Choice offered to:     DME Arranged:    DME Agency:     HH Arranged:    HH Agency:     Status of Service:  Completed, signed off  If discussed at [employer] of Stay Meetings, dates discussed:    Additional Comments:  Zenon Mayo, RN 11/24/2017, 9:35 AM

## 2017-11-24 NOTE — Consult Note (Signed)
[facility] CM Primary Care Navigator  11/24/2017  Melissa Gibbs Jan 26, 1941 [id number]   Met with patient and daughter Melissa Gibbs) at the bedside to identify possible discharge needs. Patient reportshaving"chest pains" that had led to this admission. (STEMI- ST elevation myocardial infarction, atrial fibrillation, status post emergent cardiac catheterization and coronary stent intervention)  Patient Melissa Balloon, DO with [place] as her primary care provider.   [facility] [address] to obtain medications without any problem.   Patientstatesthat she has beenmanagingher medications at homestraight out of the containers.  Patientreports that she has been driving prior to admission but daughter will be able to provide transportation toher doctors' appointments after discharge.  According to patient,she lives alone and had been active and independent. Her daughter will be her primary caregiver at home when needed.  Anticipated discharge plan ishome per patient and will undergo cardiac rehabilitation.  Patienthadvoiced understanding to call primary care provider's office for a post discharge follow-up appointment within a1- 2 weeks orsooner if needs arise. Patient letter (with PCP's contact number) wasprovided as a reminder.  Discussed with [facility]regarding [facility] CM services available for health management andresourcesat homebutshe deniesany concerns or needsat thispoint.She states being capable of managing her HTN with diet, medication, monitoring/ recording blood pressures, exercise and follow-up with provider when needed.  Patientverbalizedunderstandingof needto seekreferral from primary care provider to [facility] care management ifdeemed necessary and appropriatefor anyservicesin thenear future.  [facility] care management information was provided for  futureneeds that shemay have.  Patienthadverbally agreedand optedforEMMIcalls tofollow-up with her recovery at home.   Referral made for [facility] General calls after discharge.   For additional questions please contact:  Edwena Felty A. Eldoris Beiser, BSN, RN-BC [facility] PRIMARY CARE Navigator Cell: [phone]

## 2017-11-24 NOTE — Progress Notes (Signed)
CARDIAC REHAB PHASE I   PRE:  Rate/Rhythm: 72 SR  BP:  Sitting: 135/75      SaO2: 93 RA  MODE:  Ambulation: 600 ft  93 peak HR  POST:  Rate/Rhythm: 76 SR  BP:  Sitting: 136/73    SaO2: 97 RA   Pt ambulated 638ft in hallway standby-assist. Pt like to use handrails for balance, kept declining use of walker. Pt denies CP but does c/o some SOB. Pt educated on importance of ASA, Brilinta, statin, and NTG. MI book and stent cards at bedside. Pt given heart healthy diet. Reviewed restrictions and exercise guidelines. Pt denies further questions or concerns. Will refer to [facility] [place].   [id number] Rufina Falco, RN BSN 11/24/2017 8:53 AM

## 2017-11-24 NOTE — Care Management (Signed)
Brilinta/Ticagrelor is covered if she pays what is left on her deduct which is 95.00 her co pay will be 85. 88m 90 day supply and 45.00 for retail pharm. If deduct not met it will be 220.0 mail order and 140. Retail co pay  NMidge MiniumRN, BSN, NCM-BC, ACM-RN [phone]

## 2017-11-24 NOTE — Progress Notes (Addendum)
Progress Note  Patient Name: Melissa Gibbs Date of Encounter: 11/24/2017  Primary Cardiologist: Sherren Mocha, MD  Subjective   Mrs Artiga is feeling well today. She denies chest pain or shortness of breath. Has been able to get up and walk.  Inpatient Medications    Scheduled Meds: . aspirin EC  81 mg Oral Daily  . atorvastatin  80 mg Oral q1800  . carvedilol  3.125 mg Oral BID WC  . losartan  12.5 mg Oral Daily  . potassium chloride  40 mEq Oral TID  . sodium chloride flush  3 mL Intravenous Q12H  . sodium chloride flush  3 mL Intravenous Q12H  . ticagrelor  90 mg Oral BID   Continuous Infusions: . sodium chloride Stopped (11/21/17 0000)  . sodium chloride    . sodium chloride     PRN Meds: sodium chloride, sodium chloride, acetaminophen, alum & mag hydroxide-simeth, nitroGLYCERIN, ondansetron (ZOFRAN) IV, sodium chloride flush, sodium chloride flush   Vital Signs    Vitals:   11/23/17 2000 11/23/17 2035 11/24/17 0228 11/24/17 0747  BP:  (!) 114/57 134/71 135/75  Pulse: (!) 59 63 65 79  Resp: 19 (!) 28 15 18   Temp:  97.7 F (36.5 C) 98.4 F (36.9 C) 98.3 F (36.8 C)  TempSrc:  Oral Oral Oral  SpO2: 94% 96% 95% 97%  Weight:   75.1 kg   Height:        Intake/Output Summary (Last 24 hours) at 11/24/2017 0759 Last data filed at 11/24/2017 0748 Gross per 24 hour  Intake 509.18 ml  Output 1500 ml  Net -990.82 ml   Filed Weights   11/20/17 1951 11/24/17 0228  Weight: 74.8 kg 75.1 kg    Telemetry    NSR - Personally Reviewed  ECG    NSR, Low voltage, TWI aVL - Personally Reviewed  Physical Exam   GEN: No acute distress.   Neck: No JVD Cardiac: RRR, no murmurs, rubs, or gallops.  Respiratory: Clear to auscultation bilaterally. GI: Soft, nontender, non-distended; Mild bruising R-wrist; R groin site clean and dry, minimal brusing; L Radial site clean with mild brusing, no hematoma, good pulse MS: No edema; No deformity. Neuro:  Nonfocal  Psych:  Normal affect   Labs    Chemistry Recent Labs  Lab 11/22/17 0254 11/23/17 0234 11/24/17 0351  NA 138 139 138  K 3.7 3.7 3.3*  CL 105 105 107  CO2 26 26 24   GLUCOSE 113* 114* 94  BUN 12 13 11   CREATININE 0.82 0.91 0.87  CALCIUM 8.9 8.7* 8.7*  GFRNONAA >60 59* >60  GFRAA >60 >60 >60  ANIONGAP 7 8 7      Hematology Recent Labs  Lab 11/22/17 0254 11/23/17 0234 11/24/17 0351  WBC 9.5 10.8* 10.6*  RBC 4.08 3.89 3.81*  HGB 11.3* 11.0* 10.7*  HCT 36.7 35.0* 34.3*  MCV 90.0 90.0 90.0  MCH 27.7 28.3 28.1  MCHC 30.8 31.4 31.2  RDW 13.2 13.4 13.4  PLT 271 244 250    Cardiac Enzymes Recent Labs  Lab 11/20/17 2229 11/21/17 0237 11/21/17 0902 11/21/17 1311  TROPONINI 0.28* 3.95* 10.46* 9.11*    Recent Labs  Lab 11/20/17 2015  TROPIPOC 0.09*     BNP Recent Labs  Lab 11/21/17 0237  BNP 184.5*     DDimer No results for input(s): DDIMER in the last 168 hours.   Radiology    No results found.  Cardiac Studies   Cath 11/20/2017  There  is moderate left ventricular systolic dysfunction.  LV end diastolic pressure is mildly elevated.  Ost Cx to Prox Cx lesion is 100% stenosed.  Prox LAD lesion is 80% stenosed.  Mid LAD lesion is 80% stenosed.  Prox RCA lesion is 60% stenosed.  A drug-eluting stent was successfully placed using a STENT SIERRA 4.00 X 15 MM.  Post intervention, there is a 0% residual stenosis.   1.  Acute inferoposterior MI secondary to acute occlusion of the proximal circumflex, treated successfully with PCI using a 4.0 x 15 mm Xience Sierra DES 2.  Severe proximal LAD stenosis and moderately severe mid to distal LAD stenosis 3.  Moderate RCA stenosis with a 60% lesion in the proximal to mid vessel 4.  Moderate segmental LV systolic dysfunction and a circumflex infarct pattern, LVEF estimated at 45%  Echo 11/22/2017 Study Conclusions - Left ventricle: The cavity size was normal. Systolic function was   moderately reduced. The  estimated ejection fraction was in the   range of 35% to 40%. There is hypokinesis of the basal and mid   inferolateral, inferior and apical inferior and lateral walls.   Doppler parameters are consistent with abnormal left ventricular   relaxation (grade 1 diastolic dysfunction). Doppler parameters   are consistent with elevated ventricular end-diastolic filling   pressure. - Aortic valve: There was mild regurgitation. - Mitral valve: There was trivial regurgitation. - Left atrium: The atrium was mildly dilated. - Right ventricle: Systolic function was normal. - Right atrium: The atrium was normal in size. - Tricuspid valve: There was mild regurgitation. - Pulmonic valve: There was mild regurgitation. - Pulmonary arteries: Systolic pressure was mildly increased. PA   peak pressure: 33 mm Hg (S). - Inferior vena cava: The vessel was normal in size. - Pericardium, extracardiac: There was no pericardial effusion.  Cath intervention 11/23/2017  Prox LAD lesion is 80% stenosed.  Mid LAD lesion is 80% stenosed.  A drug-eluting stent was successfully placed using a STENT SIERRA 2.75 X 18 MM.  Post intervention, there is a 0% residual stenosis.   1. Severe stenosis mid LAD (staged PCI planned for today).  2. Successful PTCA/DES x 1 mid LAD  Patient Profile     77 y.o. female presented with with inferoposterior STEMI. Cath showed 100% occluded Prox LCx, 80% stenosis of proximal and mid LAD' and 60% RCA. LCx treated with DES to 0% residual stenosis. Intervention on 10/21 with DES to Prox LAD lesion with 0% residual stenosis.  Assessment & Plan    1) STEMI: Presented with inferoposterior STEMI. Cath showed 100% occluded Prox LCx, which was treated with DES to 0% residual occlusion. 80% stenosis of proximal and mid LAD; and 60% RCA also noted. R-radial had to be aborted in favor of R Groin 2/2 occlusion of mid R radial artery. Troponin peaked at ~11. Former smoker (quit at 77 yrs old).  Prox LAD intervention performed 10/21 with 0% residual stenosis. Remains chest pain free. Likely home today, have consulted care management to see patient regarding Brilinta). - DAPT (ASA/Brilinta) - Coreg 3.125 BID  - Atorvastatin 80mg  Daily - Losartan 12.5mg  Daily  2) Ischemic Cardiomyopathy: EF 35-40% on Echo in the setting of acute MI. Euvolemic on exam. Will need follow up Echo in 2-3 months. BP has been soft to normo-tensive, will titrate meds as tolerated. - Continue with Coreg 3.125 BID,  Losartan 12.5mg  Daily  3) Hypokalemia: K 3.3 on AM labs will replete with 60mg  PO KCl 4) HLD: LDL  127. Atorvastatin as above. 5) PAF: Peri infarct A.fib. Sinus rhythm on telemetry today. No need for anticoagulation unless this recurs. 6) PAD: R-radial artery noted to be occluded during cath. On DAPT for DES.   For questions or updates, please contact [place] Please consult [email or web address] for contact info under    Signed, Neva Seat, MD  11/24/2017, 7:59 AM    I have personally seen and examined this patient. I agree with the assessment and plan as outlined above.  She is doing well this am post PCI of the LAD with a DES yesterday. This was staged following presentation over the weekend with inferolateral STEMI. Her occluded circumflex was treated with a DES as well. Continue DAPT with ASA and Brilinta for one year. Continue high intensity statin, Coreg and ARB. LVEF 40% post cath. Will arrange repeat echo in 3 months. Will replace potassium today.  Plan discharge later today. Dr. Burt Knack will be her primary cardiologist.   Lauree Chandler 11/24/2017 9:52 AM

## 2017-11-24 NOTE — Telephone Encounter (Signed)
[place]   [facility] appointment made on 12/07/17 at 9:00 with [facility] per Mendel Ryder

## 2017-11-24 NOTE — Discharge Summary (Addendum)
Discharge Summary    Patient ID: Melissa Gibbs MRN: [id number]; DOB: 1940/11/06  Admit date: 11/20/2017 Discharge date: 11/24/2017  Primary Care Provider: Steve Rattler, DO  Primary Cardiologist: Sherren Mocha, MD  Primary Electrophysiologist:  None   Discharge Diagnoses    Principal Problem:   STEMI involving left circumflex coronary artery [facility]) Active Problems:   Ischemic cardiomyopathy   Hyperlipidemia   Coronary artery disease involving native coronary artery of native heart with unstable angina pectoris (HCC)   Paroxysmal atrial fibrillation ([place])   PAD (peripheral artery disease) ([place])   Allergies Allergies  Allergen Reactions  . Amoxicillin Diarrhea    Has patient had a PCN reaction causing immediate rash, facial/tongue/throat swelling, SOB or lightheadedness with hypotension: NO Has patient had a PCN reaction causing severe rash involving mucus membranes or skin necrosis: NO Has patient had a PCN reaction that required hospitalization: NO Has patient had a PCN reaction occurring within the last 10 years: NO  If all of the above answers are "NO", then may proceed with Cephalosporin use.    Diagnostic Studies/Procedures    Left Heart Catheterization 11/20/2017  There is moderate left ventricular systolic dysfunction.  LV end diastolic pressure is mildly elevated.  Ost Cx to Prox Cx lesion is 100% stenosed.  Prox LAD lesion is 80% stenosed.  Mid LAD lesion is 80% stenosed.  Prox RCA lesion is 60% stenosed.  A drug-eluting stent was successfully placed using a STENT SIERRA 4.00 X 15 MM.  Post intervention, there is a 0% residual stenosis.  1. Acute inferoposterior MI secondary to acute occlusion of the proximal circumflex, treated successfully with PCI using a 4.0 x 15 mm Xience Sierra DES 2. Severe proximal LAD stenosis and moderately severe mid to distal LAD stenosis 3. Moderate RCA stenosis with a 60% lesion in the proximal to mid vessel 4.  Moderate segmental LV systolic dysfunction and a circumflex infarct pattern, LVEF estimated at 45% _______________  Echocardiogram 11/22/2017 Study Conclusions - Left ventricle: The cavity size was normal. Systolic function was moderately reduced. The estimated ejection fraction was in the range of 35% to 40%. There is hypokinesis of the basal and mid inferolateral, inferior and apical inferior and lateral walls. Doppler parameters are consistent with abnormal left ventricular relaxation (grade 1 diastolic dysfunction). Doppler parameters are consistent with elevated ventricular end-diastolic filling pressure. - Aortic valve: There was mild regurgitation. - Mitral valve: There was trivial regurgitation. - Left atrium: The atrium was mildly dilated. - Right ventricle: Systolic function was normal. - Right atrium: The atrium was normal in size. - Tricuspid valve: There was mild regurgitation. - Pulmonic valve: There was mild regurgitation. - Pulmonary arteries: Systolic pressure was mildly increased. PA peak pressure: 33 mm Hg (S). - Inferior vena cava: The vessel was normal in size. - Pericardium, extracardiac: There was no pericardial effusion. _______________  Coronary Stent Intervention 11/23/2017  Prox LAD lesion is 80% stenosed.  Mid LAD lesion is 80% stenosed.  A drug-eluting stent was successfully placed using a STENT SIERRA 2.75 X 18 MM.  Post intervention, there is a 0% residual stenosis.  1. Severe stenosis mid LAD (staged PCI planned for today).  2. Successful PTCA/DES x 1 mid LAD   History of Present Illness     Ms. Melissa Gibbs is a 77 year old Caucasian female with a history of hypertension, hyperlipidemia, and previous tobacco use who presented to Urgent Care on 11/20/2017 complaining of chest pain that started at 5pm. Patient reports onset  of pain was gradual. She has never had pain like that before. Her SBP is controlled at home.   At  Urgent Care the EKG showed borderline ST chages. Therefore, she was sent over to the [facility] ED. Initial troponin elevated at 0.07. Repeat EKG at 10pm turned positive for STEMI. She took two Aspirin at home and received 4000 units of Heparin in the ED. Nitroglycerin was not given due to concern for possible inferior MI.  Patient also had atrial fibrillation and runs of bradycardia in the ED. BP dropped to 90's from 120. Normal saline was given.   Patient taken to cath lab for emergent cardiac catheterization.   Patient was accompanied by her daughter.  Hospital Course     Consultants: None  Ms. Manthe presented to the [facility] ED on 11/20/2017 for evaluation of chest pain as stated above. She was taken urgently to the cath lab with inferoposterior STEMI. Left heart catheterization showed 100% occluded proximal LCx which was treated with DES. She also had 80% stenosis of the proximal and mid LAD and 60% stenosis of RCA. Staged intervention of the LAD was recommended for Monday 11/23/2017. Patient's right radial artery was noted to be occluded during heart catheterization, but patient tolerated procedure well. Patient did have a short run of non-sustained VT within 48 hours of MI. Patient underwent staged coronary stent intervention yesterday 11/23/2017 with successful PTCA/DES x1 to the mid LAD. Patient tolerated procedure well. Patient has not had any recurrent episodes of atrial fibrillation following episode in the ED. Given peri-infarct atrial fibrillation and no recurrence, no need for anticoagulation at this time.   Will continue dual antiplatelet therapy with Aspirin and Brilinta. Will also continue Coreg 3.125mg  twice daily, Atorvastatin 80mg  daily, and Losartan 12.5mg  daily.   Potassium 3.3 this morning. Will replete with 60mg  PO KCl prior to discharge.  Dr. Angelena Form saw patient this morning and determined she was stable for discharge. Hospital follow-up visit has been arranged.  Medications as below. _____________  Discharge Vitals Blood pressure 135/75, pulse 79, temperature 98.3 F (36.8 C), temperature source Oral, resp. rate 18, height 5\' 2"  (1.575 m), weight 75.1 kg, SpO2 97 %.  Filed Weights   11/20/17 1951 11/24/17 0228  Weight: 74.8 kg 75.1 kg    Labs & Radiologic Studies    CBC Recent Labs    11/23/17 0234 11/24/17 0351  WBC 10.8* 10.6*  NEUTROABS 7.8* 7.9*  HGB 11.0* 10.7*  HCT 35.0* 34.3*  MCV 90.0 90.0  PLT 244 254   Basic Metabolic Panel Recent Labs    11/23/17 0234 11/24/17 0351  NA 139 138  K 3.7 3.3*  CL 105 107  CO2 26 24  GLUCOSE 114* 94  BUN 13 11  CREATININE 0.91 0.87  CALCIUM 8.7* 8.7*   Liver Function Tests No results for input(s): AST, ALT, ALKPHOS, BILITOT, PROT, ALBUMIN in the last 72 hours. No results for input(s): LIPASE, AMYLASE in the last 72 hours. Cardiac Enzymes Recent Labs    11/21/17 1311  TROPONINI 9.11*   BNP Invalid input(s): POCBNP D-Dimer No results for input(s): DDIMER in the last 72 hours. Hemoglobin A1C No results for input(s): HGBA1C in the last 72 hours. Fasting Lipid Panel No results for input(s): CHOL, HDL, LDLCALC, TRIG, CHOLHDL, LDLDIRECT in the last 72 hours. Thyroid Function Tests No results for input(s): TSH, T4TOTAL, T3FREE, THYROIDAB in the last 72 hours.  Invalid input(s): FREET3 _____________  Dg Chest 2 View  Result Date: 11/20/2017 CLINICAL  DATA:  Chest pain today. EXAM: CHEST - 2 VIEW COMPARISON:  None. FINDINGS: Lung volumes are low but the lungs are clear. Heart size is normal. No pneumothorax or pleural fluid. No acute or focal bony abnormality. Scattered spondylosis noted. IMPRESSION: No acute disease. Electronically Signed   By: Inge Rise M.D.   On: 11/20/2017 20:23   Disposition   Patient is being discharged home today in good condition.  Follow-up Plans & Appointments    Follow-up Information    [place], Crista Luria, [place] Follow up on 12/07/2017.     Specialty:  Cardiology Why:  at 9am for your follow up appt.  Contact information: [address] [place] [address] [phone]          Discharge Instructions    Amb Referral to Cardiac Rehabilitation   Complete by:  As directed    Diagnosis:   Coronary Stents STEMI     Diet - low sodium heart healthy   Complete by:  As directed    Increase activity slowly   Complete by:  As directed       Discharge Medications   Allergies as of 11/24/2017      Reactions   Amoxicillin Diarrhea   Has patient had a PCN reaction causing immediate rash, facial/tongue/throat swelling, SOB or lightheadedness with hypotension: NO Has patient had a PCN reaction causing severe rash involving mucus membranes or skin necrosis: NO Has patient had a PCN reaction that required hospitalization: NO Has patient had a PCN reaction occurring within the last 10 years: NO  If all of the above answers are "NO", then may proceed with Cephalosporin use.      Medication List    STOP taking these medications   rosuvastatin 20 MG tablet Commonly known as:  CRESTOR     TAKE these medications   aspirin 81 MG EC tablet Take 1 tablet (81 mg total) by mouth daily. Start taking on:  11/25/2017   atorvastatin 80 MG tablet Commonly known as:  LIPITOR Take 1 tablet (80 mg total) by mouth daily at 6 PM.   carvedilol 3.125 MG tablet Commonly known as:  COREG Take 1 tablet (3.125 mg total) by mouth 2 (two) times daily with a meal.   fluticasone 50 MCG/ACT nasal spray Commonly known as:  FLONASE Place 2 sprays into both nostrils daily.   hydroxypropyl methylcellulose / hypromellose 2.5 % ophthalmic solution Commonly known as:  ISOPTO TEARS / GONIOVISC Place 1 drop into both eyes daily as needed for dry eyes.   losartan 25 MG tablet Commonly known as:  COZAAR Take 0.5 tablets (12.5 mg total) by mouth daily. What changed:  how much to take   nitroGLYCERIN 0.4 MG SL tablet Commonly known  as:  NITROSTAT Place 1 tablet (0.4 mg total) under the tongue every 5 (five) minutes x 3 doses as needed for chest pain.   ticagrelor 90 MG Tabs tablet Commonly known as:  BRILINTA Take 1 tablet (90 mg total) by mouth 2 (two) times daily.        Acute coronary syndrome (MI, NSTEMI, STEMI, etc) this admission?: Yes.     AHA/ACC Clinical Performance & Quality Measures: 1. Aspirin prescribed? - Yes 2. ADP Receptor Inhibitor (Plavix/Clopidogrel, Brilinta/Ticagrelor or Effient/Prasugrel) prescribed (includes medically managed patients)? - Yes 3. Beta Blocker prescribed? - Yes 4. High Intensity Statin (Lipitor 40-80mg  or Crestor 20-40mg ) prescribed? - Yes 5. EF assessed during THIS hospitalization? - Yes 6. For EF <40%, was ACEI/ARB prescribed? -  Yes 7. For EF <40%, Aldosterone Antagonist (Spironolactone or Eplerenone) prescribed? - No, consider starting at outpatient follow up. 8. Cardiac Rehab Phase II ordered (Included Medically managed Patients)? - Yes     Outstanding Labs/Studies   None  Duration of Discharge Encounter   Greater than 30 minutes including physician time.  Signed, Darreld Mclean, PA-C 11/24/2017, 11:34 AM

## 2017-11-25 ENCOUNTER — Telehealth (HOSPITAL_COMMUNITY): Payer: Self-pay

## 2017-11-25 NOTE — Telephone Encounter (Signed)
Pt insurance is active and benefits verified through [facility]. Co-pay $20.00, DED $0.00/$0.00 met, out of pocket $6,700.00/$434.00 met, co-insurance 0%. No pre-authorization. Passport, 11/25/17 @ 8:53AM, REF# [id number]  Will contact patient to see if she is interested in the Cardiac Rehab Program. If interested, patient will need to complete follow up appt. Once completed, patient will be contacted for scheduling upon review by the RN Navigator.

## 2017-11-25 NOTE — Telephone Encounter (Signed)
Called patient to see if she is interested in the Cardiac Rehab Program. Patient expressed interest. Explained scheduling process and went over insurance, patient verbalized understanding. Will contact patient for scheduling once f/u has been completed. 

## 2017-11-25 NOTE — Telephone Encounter (Signed)
Patient contacted regarding discharge from [facility] on 11/24/17.  Patient understands to follow up with provider Vin Bhagat PA-C on 12/07/17 at 0900 at [facility] location. Patient understands discharge instructions? yes Patient understands medications and regiment? yes Patient understands to bring all medications to this visit? yes

## 2017-11-30 ENCOUNTER — Other Ambulatory Visit: Payer: Self-pay

## 2017-11-30 NOTE — Patient Outreach (Signed)
[facility]) Care Management  11/30/2017  Melissa Gibbs 1940-09-08 [id number]  EMMI: general discharge red alert Referral date: 11/30/17 Referral reason: know who to call about changes in condition Insurance: [place] health care Day # 4  Telephone call to patient regarding EMMI general discharge red alert. HIPAA verified.  Explained to patient reason for call. Patient states she would call her cardiologist if she has any changes in condition. Patient reports she was in the hospital recently due to a heart attach.  Patient states she had stents place.  Patient states she has her medications and takes them as prescribed. Patient states she is unsure what some of her medications  Are for.  RNCM reviewed medications with patient. Discussed use of medication and bleeding signs/ symptoms for blood thinner.  Patient verbalized understanding.  Patient denies having any chest patient. RNCM reviewed signs/ symptoms of heart attack.  Patient advised to call 911 for heart attack symptoms. Patient states she has a follow up appointment scheduled with her cardiologist on 12/07/17 and has a follow up with her primary MD on 12/09/17.  Patient denies any further needs/ concerns at this time.  RNCM advised patient to notify MD of any changes in condition prior to scheduled appointment. RNCM provided contact name and number:24 hour nurse advise line [phone].  RNCM verified patient aware of 911 services for urgent/ emergent needs.,   PLAN: RNCM will close patient due to patient being assessed and having no further needs.  RNCM will send closure notification to patients primary MD  Quinn Plowman RN,BSN,CCM [facility] Telephonic  [phone]

## 2017-12-07 ENCOUNTER — Ambulatory Visit: Payer: Medicare Other | Admitting: Physician Assistant

## 2017-12-07 ENCOUNTER — Encounter: Payer: Self-pay | Admitting: Physician Assistant

## 2017-12-07 VITALS — BP 130/82 | HR 59 | Ht 62.0 in | Wt 163.8 lb

## 2017-12-07 DIAGNOSIS — I249 Acute ischemic heart disease, unspecified: Secondary | ICD-10-CM | POA: Diagnosis not present

## 2017-12-07 DIAGNOSIS — I25110 Atherosclerotic heart disease of native coronary artery with unstable angina pectoris: Secondary | ICD-10-CM

## 2017-12-07 DIAGNOSIS — I739 Peripheral vascular disease, unspecified: Secondary | ICD-10-CM

## 2017-12-07 DIAGNOSIS — I10 Essential (primary) hypertension: Secondary | ICD-10-CM | POA: Diagnosis not present

## 2017-12-07 DIAGNOSIS — E785 Hyperlipidemia, unspecified: Secondary | ICD-10-CM | POA: Diagnosis not present

## 2017-12-07 MED ORDER — LOSARTAN POTASSIUM 25 MG PO TABS: 25.0000 mg | ORAL_TABLET | Freq: Every day | ORAL | 3 refills | Status: DC

## 2017-12-07 MED FILL — LOSARTAN POTASSIUM 25 MG TA: 25 | 90 days supply | Qty: 90 | Fill #0

## 2017-12-07 NOTE — Progress Notes (Signed)
Cardiology Office Note    Date:  12/07/2017   ID:  Melissa Gibbs, Melissa Gibbs 05-Aug-1940, MRN [id number]  PCP:  Melissa Rattler, DO  Cardiologist: Dr. Burt Gibbs   Chief Complaint: Hospital follow up   History of Present Illness:   Melissa Gibbs is a 77 y.o. female hypertension, hyperlipidemia, and previous tobacco presents for hospital follow up.   Admitted 11/2017 for inferoposterior STEMI. Patient also had atrial fibrillation and runs of bradycardia in the ED. Left heart catheterization showed 100% occluded proximal LCx which was treated with DES. She also had 80% stenosis of the proximal and mid LAD and 60% stenosis of RCA. Patient underwent staged coronary stent intervention with successful PTCA/DES x1 to the mid LAD. Patient did have a short run of non-sustained VT within 48 hours of MI. Patient has not had any recurrent episodes of atrial fibrillation following episode in the ED. Given peri-infarct atrial fibrillation and no recurrence, no need for anticoagulation at this time.   Here today for follow up. Walking 15-20 minutes multiple times/day without any symptoms. The patient denies nausea, vomiting, fever, chest pain, palpitations, shortness of breath, orthopnea, PND, dizziness, syncope, cough, congestion, abdominal pain, hematochezia, melena, lower extremity edema. Uses low sodium diet.   Past Medical History:  Diagnosis Date  . CAD (coronary artery disease) 11/20/2017   a. Dx STEMI on 11/20/2017 s/p staged PCI with DES to pLCx and DES to mid LAD  . Hyperlipidemia   . Hypertension   . Ischemic cardiomyopathy    a. Echo 11/22/2017 w/ LVEF of 35-40%, hypokinesis of basal and mid inferolateral, inferior, and apical inferior and lateral walls, and G1DD  . PAD (peripheral artery disease) ([place])    a. Right radial artery noted to be occluded during heart catheterization in 11/2017.  [name] Paroxysmal atrial fibrillation in setting of acute MI    a. Brief episode of atrial fibrillation in  setting of acute STEMI - no chronic anticoagulation needed unless recurrent episodes  . Shingles 2000   R flank, mild, self reported, did not present to care     Past Surgical History:  Procedure Laterality Date  . ABDOMINAL HYSTERECTOMY     fibroids   . CORONARY STENT INTERVENTION N/A 11/20/2017   Procedure: CORONARY STENT INTERVENTION;  Surgeon: Sherren Mocha, MD;  Location: [facility];  Service: Cardiovascular;  Laterality: N/A;  . CORONARY STENT INTERVENTION N/A 11/23/2017   Procedure: CORONARY STENT INTERVENTION;  Surgeon: Burnell Blanks, MD;  Location: [facility];  Service: Cardiovascular;  Laterality: N/A;  . CORONARY/GRAFT ACUTE MI REVASCULARIZATION N/A 11/20/2017   Procedure: Coronary/Graft Acute MI Revascularization;  Surgeon: Sherren Mocha, MD;  Location: [facility];  Service: Cardiovascular;  Laterality: N/A;  . LEFT HEART CATH AND CORONARY ANGIOGRAPHY N/A 11/20/2017   Procedure: LEFT HEART CATH AND CORONARY ANGIOGRAPHY;  Surgeon: Sherren Mocha, MD;  Location: [facility];  Service: Cardiovascular;  Laterality: N/A;  . SKIN CANCER EXCISION  2014   [facility] regularly    Current Medications: Prior to Admission medications   Medication Sig Start Date End Date Taking? Authorizing Provider  aspirin 81 MG EC tablet Take 1 tablet (81 mg total) by mouth daily. 11/25/17   Sande Rives E, PA-C  atorvastatin (LIPITOR) 80 MG tablet Take 1 tablet (80 mg total) by mouth daily at 6 PM. 11/24/17   Sande Rives E, PA-C  carvedilol (COREG) 3.125 MG tablet Take 1 tablet (3.125 mg total) by  mouth 2 (two) times daily with a meal. 11/24/17   Sande Rives E, PA-C  fluticasone (FLONASE) 50 MCG/ACT nasal spray Place 2 sprays into both nostrils daily. Patient not taking: Reported on 11/21/2017 04/28/17   Melissa Rattler, DO  hydroxypropyl methylcellulose / hypromellose (ISOPTO TEARS / GONIOVISC) 2.5 % ophthalmic solution  Place 1 drop into both eyes daily as needed for dry eyes.    [provider]  losartan (COZAAR) 25 MG tablet Take 0.5 tablets (12.5 mg total) by mouth daily. 11/24/17   Sande Rives E, PA-C  nitroGLYCERIN (NITROSTAT) 0.4 MG SL tablet Place 1 tablet (0.4 mg total) under the tongue every 5 (five) minutes x 3 doses as needed for chest pain. 11/24/17   Darreld Mclean, PA-C  ticagrelor (BRILINTA) 90 MG TABS tablet Take 1 tablet (90 mg total) by mouth 2 (two) times daily. 11/24/17   Darreld Mclean, PA-C    Allergies:   Amoxicillin   Social History   Socioeconomic History  . Marital status: Divorced    Spouse name: Not on file  . Number of children: Not on file  . Years of education: Not on file  . Highest education level: Not on file  Occupational History  . 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 Occupation: Public affairs consultant: [employer]  . Financial resource strain: Not on file  . Food insecurity:    Worry: Not on file    Inability: Not on file  . Transportation needs:    Medical: Not on file    Non-medical: Not on file  Tobacco Use  . Smoking status: Former Smoker    Types: Cigarettes  . Smokeless tobacco: Never Used  . Tobacco comment: quit smoking at age 84  Substance and Sexual Activity  . Alcohol use: Yes    Comment: Very rarely, never by herself  . Drug use: No  . Sexual activity: Never  Lifestyle  . Physical activity:    Days per week: Not on file    Minutes per session: Not on file  . Stress: Not on file  Relationships  . Social connections:    Talks on phone: Not on file    Gets together: Not on file    Attends religious service: Not on file    Active member of club or organization: Not on file    Attends meetings of clubs or organizations: Not on file    Relationship status: Not on file  Other Topics Concern  . Not on file  Social History Narrative   Divorced, lives  alone.  One adult daughter, Melissa Gibbs.  DNR/DNI.    Feels strongly when it is her time she would like to pass naturally, her daughter is CNA for elderly person.        Likes to work in the yard when its not so hot out. Works a lot in [employer] at [employer].     Family History:  The patient's family history includes Cancer in her father and sister; Colon cancer in her father; Heart disease (age of onset: 37) in her mother; Hypertension in her father; Melanoma in her other.   ROS:   Please see the history of present illness.    ROS All other systems reviewed and are negative.   PHYSICAL EXAM:   VS:  BP 130/82   Pulse (!) 59   Ht 5\' 2"  (1.575  m)   Wt 163 lb 12.8 oz (74.3 kg)   SpO2 97%   BMI 29.96 kg/m    GEN: Well nourished, well developed, in no acute distress  HEENT: normal  Neck: no JVD, carotid bruits, or masses Cardiac: RRR; no murmurs, rubs, or gallops,no edema  Respiratory:  clear to auscultation bilaterally, normal work of breathing GI: soft, nontender, nondistended, + BS MS: no deformity or atrophy  Skin: warm and dry, no rash Neuro:  Alert and Oriented x 3, Strength and sensation are intact Psych: euthymic mood, full affect  Wt Readings from Last 3 Encounters:  12/07/17 163 lb 12.8 oz (74.3 kg)  11/24/17 165 lb 9.1 oz (75.1 kg)  10/30/17 163 lb (73.9 kg)      Studies/Labs Reviewed:   EKG:  EKG is ordered today.  The ekg ordered today demonstrates SR with inferior lateral TWI  Recent Labs: 11/21/2017: B Natriuretic Peptide 184.5 11/24/2017: BUN 11; Creatinine, Ser 0.87; Hemoglobin 10.7; Platelets 250; Potassium 3.3; Sodium 138   Lipid Panel    Component Value Date/Time   CHOL 196 11/20/2017 2229   CHOL 213 (H) 04/09/2017 1403   TRIG 95 11/20/2017 2229   HDL 50 11/20/2017 2229   HDL 45 04/09/2017 1403   CHOLHDL 3.9 11/20/2017 2229   VLDL 19 11/20/2017 2229   LDLCALC 127 (H) 11/20/2017 2229   LDLCALC 146 (H) 04/09/2017 1403   LDLDIRECT 139 (H) 08/28/2006 2217     Additional studies/ records that were reviewed today include:   Left Heart Catheterization 11/20/2017  There is moderate left ventricular systolic dysfunction.  LV end diastolic pressure is mildly elevated.  Ost Cx to Prox Cx lesion is 100% stenosed.  Prox LAD lesion is 80% stenosed.  Mid LAD lesion is 80% stenosed.  Prox RCA lesion is 60% stenosed.  A drug-eluting stent was successfully placed using a STENT SIERRA 4.00 X 15 MM.  Post intervention, there is a 0% residual stenosis.  1. Acute inferoposterior MI secondary to acute occlusion of the proximal circumflex, treated successfully with PCI using a 4.0 x 15 mm Xience Sierra DES 2. Severe proximal LAD stenosis and moderately severe mid to distal LAD stenosis 3. Moderate RCA stenosis with a 60% lesion in the proximal to mid vessel 4. Moderate segmental LV systolic dysfunction and a circumflex infarct pattern, LVEF estimated at 45% _______________  Echocardiogram 11/22/2017 Study Conclusions - Left ventricle: The cavity size was normal. Systolic function was moderately reduced. The estimated ejection fraction was in the range of35% to 40%. There ishypokinesisof the basal and mid inferolateral, inferior and apical inferior and lateral walls. Doppler parameters are consistent with abnormal left ventricular relaxation (grade 1 diastolic dysfunction). Doppler parameters are consistent with elevated ventricular end-diastolic filling pressure. - Aortic valve: There was mild regurgitation. - Mitral valve: There was trivial regurgitation. - Left atrium: The atrium was mildly dilated. - Right ventricle: Systolic function was normal. - Right atrium: The atrium was normal in size. - Tricuspid valve: There was mild regurgitation. - Pulmonic valve: There was mild regurgitation. - Pulmonary arteries: Systolic pressure was mildly increased. PA peak pressure: 33 mm Hg (S). - Inferior vena cava: The vessel  was normal in size. - Pericardium, extracardiac: There was no pericardial effusion. _______________  Coronary Stent Intervention 11/23/2017  Prox LAD lesion is 80% stenosed.  Mid LAD lesion is 80% stenosed.  A drug-eluting stent was successfully placed using a STENT SIERRA 2.75 X 18 MM.  Post intervention, there is a 0% residual  stenosis.  1. Severe stenosis mid LAD (staged PCI planned for today).  2. Successful PTCA/DES x 1 mid LAD    ASSESSMENT & PLAN:    1. CAD s/p DES to pCx and DES to Edison therapy for residual disease.  No angina. Continue ASA, Brillinta, statin and BB. Will enroll in CRP II.   2. Brief afib during STEMI while in ER - Given peri-infarct atrial fibrillation and no recurrence, no need for anticoagulation at this time  3. HLD - 11/20/2017: Cholesterol 196; HDL 50; LDL Cholesterol 127; Triglycerides 95; VLDL 19  - Continue high intensity statin. Repeat labs in 4 weeks.   4. Chronic systolic CHF/ICM - Echo showed LVEF of 35-40% with grade 1 DD. Hopefully improved with revascularization and medication therapy. Recheck in few months.  -Euvolemic. Uses low sodium diet. Reviewed heart failure symptoms. Continue coreg 3.125mg  (unable to up titrate due to slow rate). Increase losartan to 25mg  qd to maximize therapy.   5. HTN - Stable. Medication changes as above.   6. PAD -R-radial artery noted to be occluded during cath. On DAPT for DES.  Medication Adjustments/Labs and Tests Ordered: Current medicines are reviewed at length with the patient today.  Concerns regarding medicines are outlined above.  Medication changes, Labs and Tests ordered today are listed in the Patient Instructions below. Patient Instructions  Medication Instructions:  Your physician has recommended you make the following change in your medication:  1.  INCREASE the Losartan to 25 mg taking 1 tablet daily  If you need a refill on your cardiac medications before your next  appointment, please call your pharmacy.   Lab work: 4 WEEKS:  FASTING LIPID & LFT  If you have labs (blood work) drawn today and your tests are completely normal, you will receive your results only by: [name] MyChart Message (if you have MyChart) OR . A paper copy in the mail If you have any lab test that is abnormal or we need to change your treatment, we will call you to review the results.  Testing/Procedures: None ordered  Follow-Up: You will need a follow up appointment in:  3 months with Dr. Burt Gibbs.  Any Other Special Instructions Will Be Listed Below (If Applicable).       Jarrett Soho, [place]  12/07/2017 9:31 AM    [facility] [address], [place], [place]  [address] Phone: [phone]; Fax: [phone]

## 2017-12-07 NOTE — Patient Instructions (Signed)
Medication Instructions:  Your physician has recommended you make the following change in your medication:  1.  INCREASE the Losartan to 25 mg taking 1 tablet daily  If you need a refill on your cardiac medications before your next appointment, please call your pharmacy.   Lab work: 4 WEEKS:  FASTING LIPID & LFT  If you have labs (blood work) drawn today and your tests are completely normal, you will receive your results only by: [name] MyChart Message (if you have MyChart) OR . A paper copy in the mail If you have any lab test that is abnormal or we need to change your treatment, we will call you to review the results.  Testing/Procedures: None ordered  Follow-Up: You will need a follow up appointment in:  3 months with Dr. Burt Knack.  Any Other Special Instructions Will Be Listed Below (If Applicable).

## 2017-12-08 ENCOUNTER — Telehealth: Payer: Self-pay | Admitting: Family Medicine

## 2017-12-08 ENCOUNTER — Telehealth (HOSPITAL_COMMUNITY): Payer: Self-pay

## 2017-12-08 NOTE — Telephone Encounter (Signed)
Spoke with pt reminding them of/confirming their appt for tomorrow 12/09/2017. -CH °

## 2017-12-08 NOTE — Telephone Encounter (Signed)
Called and spoke with patient in regards to Cardiac Rehab - Scheduled orientation on 02/04/17 at 7:30am. Pt will attend the 6:45am exc class. Mailed packet.

## 2017-12-09 ENCOUNTER — Encounter: Payer: Self-pay | Admitting: Family Medicine

## 2017-12-09 ENCOUNTER — Ambulatory Visit (INDEPENDENT_AMBULATORY_CARE_PROVIDER_SITE_OTHER): Payer: Medicare Other | Admitting: Family Medicine

## 2017-12-09 ENCOUNTER — Other Ambulatory Visit: Payer: Self-pay

## 2017-12-09 VITALS — BP 138/72 | HR 60 | Temp 98.5°F | Ht 61.0 in | Wt 161.6 lb

## 2017-12-09 DIAGNOSIS — I25110 Atherosclerotic heart disease of native coronary artery with unstable angina pectoris: Secondary | ICD-10-CM

## 2017-12-09 DIAGNOSIS — Z09 Encounter for follow-up examination after completed treatment for conditions other than malignant neoplasm: Secondary | ICD-10-CM

## 2017-12-09 NOTE — Progress Notes (Signed)
    Subjective:    Patient ID: Melissa Gibbs, female    DOB: [birth year], 77 y.o.   MRN: [id number]   CC: hosp follow up from MI  HPI: patient reports she is "shocked" about having MI. She found out after her brother has CAD and 2 stents placed. She reports compliance with her meds and has them with her today. She has no concerns at this time. She is committed to working with cards. She will start cardiac rehab in January. She is walking some now for light exercise. She will see cardiology in a few weeks and again in February. She has not had any further chest pain.   Smoking status reviewed- former smoker  Review of Systems- no CP, SOB   Objective:  BP 138/72   Pulse 60   Temp 98.5 F (36.9 C) (Oral)   Ht 5\' 1"  (1.549 m)   Wt 161 lb 9.6 oz (73.3 kg)   SpO2 99%   BMI 30.53 kg/m  Vitals and nursing note reviewed  General: well nourished, in no acute distress HEENT: normocephalic, MMM Cardiac: RRR, clear S1 and S2, no murmurs, rubs, or gallops Respiratory: clear to auscultation bilaterally, no increased work of breathing Extremities: no edema or cyanosis Neuro: alert and oriented, no focal deficits   Assessment & Plan:    1. Hospital discharge follow-up Patient doing well, no acute needs, follow up prn  2. Coronary artery disease involving native coronary artery of native heart with unstable angina pectoris [facility]) Following with cards, meds reviewed and appropriate, patient reports compliance. She has not required any SL nitro.    Return in about 6 months (around 06/09/2018).   Lucila Maine, DO Family Medicine Resident PGY-3

## 2017-12-09 NOTE — Patient Instructions (Addendum)
  Great to see you today! I'm glad you're doing okay. Please let me know if you need anything or have any questions. I'll follow along with cardiology and see you as needed.  If you have questions or concerns please do not hesitate to call at [phone].  Lucila Maine, DO PGY-3, [facility] 12/09/2017 11:56 AM

## 2017-12-11 ENCOUNTER — Other Ambulatory Visit: Payer: Self-pay

## 2017-12-18 ENCOUNTER — Other Ambulatory Visit: Payer: Self-pay

## 2017-12-18 MED ORDER — ATORVASTATIN CALCIUM 80 MG PO TABS: 80.0000 mg | ORAL_TABLET | Freq: Every day | ORAL | 10 refills | Status: DC

## 2017-12-18 MED ORDER — CARVEDILOL 3.125 MG PO TABS: 3.1250 mg | ORAL_TABLET | Freq: Two times a day (BID) | ORAL | 10 refills | Status: DC

## 2017-12-18 MED FILL — ATORVASTATIN 80 MG TABLET: 80 | 30 days supply | Qty: 30 | Fill #0

## 2017-12-18 MED FILL — CARVEDILOL 3.125 MG TABLET: 3.125 | 60 days supply | Qty: 60 | Fill #0

## 2017-12-21 ENCOUNTER — Telehealth: Payer: Self-pay | Admitting: Cardiovascular Disease

## 2017-12-21 ENCOUNTER — Other Ambulatory Visit: Payer: Self-pay | Admitting: Cardiovascular Disease

## 2017-12-21 MED ORDER — ATORVASTATIN CALCIUM 80 MG PO TABS: 80.0000 mg | ORAL_TABLET | Freq: Every day | ORAL | 3 refills | Status: DC

## 2017-12-21 MED ORDER — CARVEDILOL 3.125 MG PO TABS: 3.1250 mg | ORAL_TABLET | Freq: Two times a day (BID) | ORAL | 3 refills | Status: DC

## 2017-12-21 NOTE — Telephone Encounter (Signed)
Pt's medications were sent to pt's pharmacy as requested. Confirmation received.  

## 2017-12-21 NOTE — Telephone Encounter (Signed)
Stop Lisinorpil. Start Valsartan 80mg  daily. Monitor symptoms. Reviewed with pharmacist.  Continue to take Brillinta until runs out (last dose should be PM). Then start Plavix in morning 300mg  x 1 then 75mg  daily.

## 2017-12-21 NOTE — Telephone Encounter (Signed)
Melissa Gibbs reports she needs to switch Brilinta to another medication. She has had no issues with Brilinta - she simply cannot afford it.  She also reports her Losartan is making her have a constant cough. She switched to Losartan when Lisinopril gave her the same cough. Her BP runs around 140-160/80-90.   She understands she will be called with medication recommendations.

## 2017-12-21 NOTE — Telephone Encounter (Signed)
New message:  Pt c/o medication issue:  1. Name of Medication: ticagrelor (BRILINTA) 90 MG TABS tablet  2. How are you currently taking this medication (dosage and times per day)? 2 times daily  3. Are you having a reaction (difficulty breathing--STAT)? n/a  4. What is your medication issue? Patient would like to see if she can get a medication that will be cheaper because the patient states that she can't afford this medication.

## 2017-12-22 MED ORDER — CLOPIDOGREL BISULFATE 75 MG PO TABS: 75.0000 mg | ORAL_TABLET | Freq: Every day | ORAL | 3 refills | Status: DC

## 2017-12-22 MED ORDER — VALSARTAN 80 MG PO TABS: 80.0000 mg | ORAL_TABLET | Freq: Every day | ORAL | 3 refills | Status: DC

## 2017-12-22 MED FILL — CLOPIDOGREL 75 MG TABLET: 75 | 90 days supply | Qty: 90 | Fill #0

## 2017-12-22 MED FILL — VALSARTAN 80 MG TABLET: 80 | 90 days supply | Qty: 90 | Fill #0

## 2017-12-22 NOTE — Telephone Encounter (Signed)
Instructed patient to STOP LOSARTAN and START VALSARTAN 80 mg daily. She has enough Brilinta to  Last through Thursday. Instructed her to finish Brilinta then START PLAVIX 75 mg daily. She understands to take Plavix 300mg  Friday morning as her first dose.  She will continue to monitor symptoms and BP and will call if assistance is needed. She was grateful for call.

## 2017-12-29 ENCOUNTER — Ambulatory Visit (INDEPENDENT_AMBULATORY_CARE_PROVIDER_SITE_OTHER): Payer: Medicare Other

## 2017-12-29 VITALS — BP 130/78 | HR 60 | Temp 98.1°F | Ht 61.0 in | Wt 162.8 lb

## 2017-12-29 DIAGNOSIS — Z0000 Encounter for general adult medical examination without abnormal findings: Secondary | ICD-10-CM | POA: Diagnosis not present

## 2017-12-29 NOTE — Progress Notes (Signed)
I have reviewed this documentation for this AWS by clinic RN and agree.  Lucila Maine, DO PGY-3, [facility] 12/29/2017 10:01 AM

## 2017-12-29 NOTE — Progress Notes (Signed)
Subjective:   Melissa Gibbs is a 77 y.o. female who presents for Medicare Annual (Subsequent) preventive examination.  Review of Systems:  Physical assessment deferred to PCP.  Cardiac Risk Factors include: advanced age (>80men, >65 women);hypertension;sedentary lifestyle;dyslipidemia     Objective:    Vitals: BP 130/78   Pulse 60   Temp 98.1 F (36.7 C) (Oral)   Ht 5\' 1"  (1.549 m)   Wt 162 lb 12.8 oz (73.8 kg)   SpO2 98%   BMI 30.76 kg/m   Body mass index is 30.76 kg/m.  Advanced Directives 12/29/2017 11/22/2017 11/20/2017 10/30/2017 04/28/2017 04/09/2017 12/23/2016  Does Patient Have a Medical Advance Directive? No Yes Yes No No No No  Type of Advance Directive - Healthcare Power of [name]  Does patient want to make changes to medical advance directive? - No - Patient declined - - - - -  Copy of [place] in Chart? - No - copy requested - - - - -  Would patient like information on creating a medical advance directive? No - Patient declined - - No - Patient declined No - Patient declined No - Patient declined (No Data)    Tobacco Social History   Tobacco Use  Smoking Status Former Smoker  . Types: Cigarettes  Smokeless Tobacco Never Used  Tobacco Comment   quit smoking at age 12     Counseling given: Not Answered Comment: quit smoking at age 49   Clinical Intake:  Pre-visit preparation completed: Yes  Pain : No/denies pain Pain Score: 0-No pain     Nutritional Status: BMI > 30  Obese Nutritional Risks: None Diabetes: No  What is the last grade level you completed in school?: 2 year college  Interpreter Needed?: No     Past Medical History:  Diagnosis Date  . CAD (coronary artery disease) 11/20/2017   a. Dx STEMI on 11/20/2017 s/p staged PCI with DES to pLCx and DES to mid LAD  . Cataract   . Hyperlipidemia   . Hypertension   . Ischemic cardiomyopathy    a. Echo 11/22/2017 w/ LVEF of  35-40%, hypokinesis of basal and mid inferolateral, inferior, and apical inferior and lateral walls, and G1DD  . Myocardial infarction ([place])   . PAD (peripheral artery disease) ([place])    a. Right radial artery noted to be occluded during heart catheterization in 11/2017.  [name] Paroxysmal atrial fibrillation in setting of acute MI    a. Brief episode of atrial fibrillation in setting of acute STEMI - no chronic anticoagulation needed unless recurrent episodes  . Shingles 2000   R flank, mild, self reported, did not present to care   . Skin cancer    Past Surgical History:  Procedure Laterality Date  . ABDOMINAL HYSTERECTOMY     fibroids   . CATARACT EXTRACTION, BILATERAL    . CORONARY STENT INTERVENTION N/A 11/20/2017   Procedure: CORONARY STENT INTERVENTION;  Surgeon: Sherren Mocha, MD;  Location: [place] [facility];  Service: Cardiovascular;  Laterality: N/A;  . CORONARY STENT INTERVENTION N/A 11/23/2017   Procedure: CORONARY STENT INTERVENTION;  Surgeon: Burnell Blanks, MD;  Location: [place] [facility];  Service: Cardiovascular;  Laterality: N/A;  . CORONARY/GRAFT ACUTE MI REVASCULARIZATION N/A 11/20/2017   Procedure: Coronary/Graft Acute MI Revascularization;  Surgeon: Sherren Mocha, MD;  Location: [place] [facility];  Service: Cardiovascular;  Laterality: N/A;  . LEFT HEART CATH AND CORONARY ANGIOGRAPHY  N/A 11/20/2017   Procedure: LEFT HEART CATH AND CORONARY ANGIOGRAPHY;  Surgeon: Sherren Mocha, MD;  Location: [place];  Service: Cardiovascular;  Laterality: N/A;  . SKIN CANCER EXCISION  2014   Sees [facility] regularly   Family History  Problem Relation Age of Onset  . Heart disease Mother 10       MI , rheumatic fever as a child, and heart murmur  . Cancer Father        knee cancer  . Colon cancer Father   . Hypertension Father   . Cancer Sister        melanoma - died at 22  . Melanoma Sister   . Heart disease Brother   . Melanoma Other          died at age 63  . Cancer Sister   . Lung cancer Sister   . Stroke Neg Hx    Social History   Socioeconomic History  . Marital status: Divorced    Spouse name: Not on file  . Number of children: 1  . Years of education: 15  . Highest education level: Associate degree: academic program  Occupational History  . 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 Occupation: Public affairs consultant: [employer]  . Financial resource strain: Not very hard  . Food insecurity:    Worry: Never true    Inability: Never true  . Transportation needs:    Medical: No    Non-medical: No  Tobacco Use  . Smoking status: Former Smoker    Types: Cigarettes  . Smokeless tobacco: Never Used  . Tobacco comment: quit smoking at age 15  Substance and Sexual Activity  . Alcohol use: Yes    Comment: very rarely drinks a wine cooler  . Drug use: No  . Sexual activity: Never  Lifestyle  . Physical activity:    Days per week: 7 days    Minutes per session: 10 min  . Stress: Only a little  Relationships  . Social connections:    Talks on phone: More than three times a week    Gets together: Twice a week    Attends religious service: Never    Active member of club or organization: No    Attends meetings of clubs or organizations: Never    Relationship status: Divorced  Other Topics Concern  . Not on file  Social History Narrative   Divorced, lives alone.  One adult daughter, Melissa Gibbs.   Has 2 dogs and one cat. Walks the dogs daily, daughter helps. Has fenced in yard.   Lives in a one level home. Has a couple stairs to get in. No handrail. No grab bars in bathroom. Smoke alarms present.   Likes to work in the yard when its not so hot out. Used to work in Estate manager/land agent at [employer].      Likes to play solitaire, has taken classes at [facility] [employer], cross stitch, ceramics, oil painting.   Likes to eat vegetables, has cut out meat, no beef or pork in 20 years,  stopped [place], chicken last year. Trying to cut out processed foods.   Likes to drink water.    Wears seat belt in vehicles.    Outpatient Encounter Medications as of 12/29/2017  Medication Sig  . aspirin 81 MG EC tablet Take 1 tablet (81 mg total) by mouth daily.  [name] atorvastatin (LIPITOR)  80 MG tablet Take 1 tablet (80 mg total) by mouth daily at 6 PM.  . carvedilol (COREG) 3.125 MG tablet Take 1 tablet (3.125 mg total) by mouth 2 (two) times daily with a meal.  . clopidogrel (PLAVIX) 75 MG tablet Take 1 tablet (75 mg total) by mouth daily.  . hydroxypropyl methylcellulose / hypromellose (ISOPTO TEARS / GONIOVISC) 2.5 % ophthalmic solution Place 1 drop into both eyes daily as needed for dry eyes.  . nitroGLYCERIN (NITROSTAT) 0.4 MG SL tablet Place 1 tablet (0.4 mg total) under the tongue every 5 (five) minutes x 3 doses as needed for chest pain.  . valsartan (DIOVAN) 80 MG tablet Take 1 tablet (80 mg total) by mouth daily.   No facility-administered encounter medications on file as of 12/29/2017.     Activities of Daily Living In your present state of health, do you have any difficulty performing the following activities: 12/29/2017 11/22/2017  Hearing? 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 Y -  Comment cataract surgery and retina surgery in last year -  Difficulty concentrating or making decisions? N -  Walking or climbing stairs? N -  Dressing or bathing? N -  Doing errands, shopping? N N  Preparing Food and eating ? N -  Using the Toilet? N -  In the past six months, have you accidently leaked urine? Y -  Do you have problems with loss of bowel control? N -  Managing your Medications? N -  Managing your Finances? N -  Housekeeping or managing your Housekeeping? N -  Some recent data might be hidden    Patient Care Team: Steve Rattler, DO as PCP - General Sherren Mocha, MD as PCP - Cardiology (Cardiology) Sydnee Levans, MD as Referring Physician  (Dermatology)    Assessment:   This is a routine wellness examination for [facility].  Exercise Activities and Dietary recommendations Current Exercise Habits: The patient does not participate in regular exercise at present, Exercise limited by: cardiac condition(s)  Goals    . Take classes     Patient would like to take art classes at [employer] again    . Track BP at home again     Patient has high blood pressure at the doctor, but reports it is low when she checks it at home. She will change the batteries in her home machine and begin tracking it again.       Fall Risk Fall Risk  12/29/2017 12/09/2017 10/30/2017 04/28/2017 04/09/2017  Falls in the past year? 0 0 No No No  Comment - - - - -  Number falls in past yr: - - - - -  Injury with Fall? - - - - -   Is the patient's home free of loose throw rugs in walkways, pet beds, electrical cords, etc?   yes      Grab bars in the bathroom? no      Handrails on the stairs?   no      Adequate lighting?   yes  Depression Screen PHQ 2/9 Scores 12/29/2017 12/09/2017 10/30/2017 04/28/2017  PHQ - 2 Score 0 0 0 0     Cognitive Function MMSE - Mini Mental State Exam 12/29/2017  Orientation to time 5  Orientation to Place 5  Registration 3  Attention/ Calculation 5  Recall 3  Language- name 2 objects 2  Language- repeat 1  Language- follow 3 step command 3  Language- read & follow direction 1  Write a sentence 1  Copy design 1  Total score 30     6CIT Screen 12/29/2017  What Year? 0 points  What month? 0 points  What time? 0 points  Count back from 20 0 points  Months in reverse 0 points  Repeat phrase 0 points  Total Score 0    Immunization History  Administered Date(s) Administered  . Influenza,inj,Quad PF,6+ Mos 10/30/2017  . Influenza-Unspecified 11/03/2012, 11/04/2014, 11/03/2016  . Pneumococcal Conjugate-13 12/23/2016  . Pneumococcal Polysaccharide-23 09/05/2010  . Td 11/08/2003    Qualifies for Shingles Vaccine?  declined  Screening Tests Health Maintenance  Topic Date Due  . DEXA SCAN  12/30/2018 (Originally 01/22/2005)  . COLONOSCOPY  12/30/2018 (Originally 08/09/2012)  . TETANUS/TDAP  12/30/2018 (Originally 11/07/2013)  . INFLUENZA VACCINE  Completed  . PNA vac Low Risk Adult  Completed    Cancer Screenings: Lung: Low Dose CT Chest recommended if Age 54-80 years, 30 pack-year currently smoking OR have quit w/in 15years. Patient does not qualify. Breast:  Up to date on Mammogram? Yes   Up to date of Bone Density/Dexa? No - declined Colorectal: declined further  Additional Screenings: : Hepatitis C Screening: N/A     Plan:  Declined further colonoscopy. Declined DEXA. Stated she had a normal one few years ago. Will f/u with PCP in the Spring.   I have personally reviewed and noted the following in the patient's chart:   . Medical and social history . Use of alcohol, tobacco or illicit drugs  . Current medications and supplements . Functional ability and status . Nutritional status . Physical activity . Advanced directives . List of other physicians . Hospitalizations, surgeries, and ER visits in previous 12 months . Vitals . Screenings to include cognitive, depression, and falls . Referrals and appointments  In addition, I have reviewed and discussed with patient certain preventive protocols, quality metrics, and best practice recommendations. A written personalized care plan for preventive services as well as general preventive health recommendations were provided to patient.     Esau Grew, RN  12/29/2017

## 2017-12-29 NOTE — Patient Instructions (Addendum)
Melissa Gibbs , Thank you for taking time to come for your Medicare Wellness Visit. I appreciate your ongoing commitment to your health goals. Please review the following plan we discussed and let me know if I can assist you in the future.   These are the goals we discussed: Goals    . Take classes     Patient would like to take art classes at [employer] again    . Track BP at home again     Patient has high blood pressure at the doctor, but reports it is low when she checks it at home. She will change the batteries in her home machine and begin tracking it again.       This is a list of the screening recommended for you and due dates:  Health Maintenance  Topic Date Due  . DEXA scan (bone density measurement)  12/30/2018*  . Colon Cancer Screening  12/30/2018*  . Tetanus Vaccine  12/30/2018*  . Flu Shot  Completed  . Pneumonia vaccines  Completed  *Topic was postponed. The date shown is not the original due date.    Fall Prevention in the Home Falls can cause injuries. They can happen to people of all ages. There are many things you can do to make your home safe and to help prevent falls. What can I do on the outside of my home?  Regularly fix the edges of walkways and driveways and fix any cracks.  Remove anything that might make you trip as you walk through a door, such as a raised step or threshold.  Trim any bushes or trees on the path to your home.  Use bright outdoor lighting.  Clear any walking paths of anything that might make someone trip, such as rocks or tools.  Regularly check to see if handrails are loose or broken. Make sure that both sides of any steps have handrails.  Any raised decks and porches should have guardrails on the edges.  Have any leaves, snow, or ice cleared regularly.  Use sand or salt on walking paths during winter.  Clean up any spills in your garage right away. This includes oil or grease spills. What can I do in the bathroom?  Use night  lights.  Install grab bars by the toilet and in the tub and shower. Do not use towel bars as grab bars.  Use non-skid mats or decals in the tub or shower.  If you need to sit down in the shower, use a plastic, non-slip stool.  Keep the floor dry. Clean up any water that spills on the floor as soon as it happens.  Remove soap buildup in the tub or shower regularly.  Attach bath mats securely with double-sided non-slip rug tape.  Do not have throw rugs and other things on the floor that can make you trip. What can I do in the bedroom?  Use night lights.  Make sure that you have a light by your bed that is easy to reach.  Do not use any sheets or blankets that are too big for your bed. They should not hang down onto the floor.  Have a firm chair that has side arms. You can use this for support while you get dressed.  Do not have throw rugs and other things on the floor that can make you trip. What can I do in the kitchen?  Clean up any spills right away.  Avoid walking on wet floors.  Keep items that you  use a lot in easy-to-reach places.  If you need to reach something above you, use a strong step stool that has a grab bar.  Keep electrical cords out of the way.  Do not use floor polish or wax that makes floors slippery. If you must use wax, use non-skid floor wax.  Do not have throw rugs and other things on the floor that can make you trip. What can I do with my stairs?  Do not leave any items on the stairs.  Make sure that there are handrails on both sides of the stairs and use them. Fix handrails that are broken or loose. Make sure that handrails are as long as the stairways.  Check any carpeting to make sure that it is firmly attached to the stairs. Fix any carpet that is loose or worn.  Avoid having throw rugs at the top or bottom of the stairs. If you do have throw rugs, attach them to the floor with carpet tape.  Make sure that you have a light switch at the  top of the stairs and the bottom of the stairs. If you do not have them, ask someone to add them for you. What else can I do to help prevent falls?  Wear shoes that: ? Do not have high heels. ? Have rubber bottoms. ? Are comfortable and fit you well. ? Are closed at the toe. Do not wear sandals.  If you use a stepladder: ? Make sure that it is fully opened. Do not climb a closed stepladder. ? Make sure that both sides of the stepladder are locked into place. ? Ask someone to hold it for you, if possible.  Clearly mark and make sure that you can see: ? Any grab bars or handrails. ? First and last steps. ? Where the edge of each step is.  Use tools that help you move around (mobility aids) if they are needed. These include: ? Canes. ? Walkers. ? Scooters. ? Crutches.  Turn on the lights when you go into a dark area. Replace any light bulbs as soon as they burn out.  Set up your furniture so you have a clear path. Avoid moving your furniture around.  If any of your floors are uneven, fix them.  If there are any pets around you, be aware of where they are.  Review your medicines with your doctor. Some medicines can make you feel dizzy. This can increase your chance of falling. Ask your doctor what other things that you can do to help prevent falls. This information is not intended to replace advice given to you by your health care provider. Make sure you discuss any questions you have with your health care provider. Document Released: 11/16/2008 Document Revised: 06/28/2015 Document Reviewed: 02/24/2014 Elsevier Interactive Patient Education  [employer].

## 2018-01-04 ENCOUNTER — Other Ambulatory Visit: Payer: Medicare Other | Admitting: *Deleted

## 2018-01-04 DIAGNOSIS — E785 Hyperlipidemia, unspecified: Secondary | ICD-10-CM

## 2018-01-04 LAB — LIPID PANEL
CHOL/HDL RATIO: 2.6 ratio (ref 0.0–4.4)
Cholesterol, Total: 116 mg/dL (ref 100–199)
HDL: 45 mg/dL (ref >39)
LDL Calculated: 56 mg/dL (ref 0–99)
TRIGLYCERIDES: 73 mg/dL (ref 0–149)
VLDL Cholesterol Cal: 15 mg/dL (ref 5–40)

## 2018-01-04 LAB — HEPATIC FUNCTION PANEL
ALBUMIN: 4.2 g/dL (ref 3.5–4.8)
ALK PHOS: 97 IU/L (ref 39–117)
ALT: 10 IU/L (ref 0–32)
AST: 16 IU/L (ref 0–40)
Bilirubin Total: 0.5 mg/dL (ref 0.0–1.2)
Bilirubin, Direct: 0.16 mg/dL (ref 0.00–0.40)
Total Protein: 7.1 g/dL (ref 6.0–8.5)

## 2018-01-05 NOTE — Progress Notes (Signed)
Pt has been made aware of normal result and verbalized understanding.  jw 01/05/18

## 2018-01-25 ENCOUNTER — Telehealth (HOSPITAL_COMMUNITY): Payer: Self-pay | Admitting: Family Medicine

## 2018-02-04 ENCOUNTER — Ambulatory Visit (HOSPITAL_COMMUNITY): Payer: Medicare Other

## 2018-02-08 ENCOUNTER — Ambulatory Visit (HOSPITAL_COMMUNITY): Payer: Medicare Other

## 2018-02-10 ENCOUNTER — Ambulatory Visit (HOSPITAL_COMMUNITY): Payer: Medicare Other

## 2018-02-12 ENCOUNTER — Ambulatory Visit (HOSPITAL_COMMUNITY): Payer: Medicare Other

## 2018-02-15 ENCOUNTER — Ambulatory Visit (HOSPITAL_COMMUNITY): Payer: Medicare Other

## 2018-02-17 ENCOUNTER — Ambulatory Visit (HOSPITAL_COMMUNITY): Payer: Medicare Other

## 2018-02-19 ENCOUNTER — Ambulatory Visit (HOSPITAL_COMMUNITY): Payer: Medicare Other

## 2018-02-22 ENCOUNTER — Ambulatory Visit (HOSPITAL_COMMUNITY): Payer: Medicare Other

## 2018-02-24 ENCOUNTER — Ambulatory Visit (HOSPITAL_COMMUNITY): Payer: Medicare Other

## 2018-02-26 ENCOUNTER — Ambulatory Visit (HOSPITAL_COMMUNITY): Payer: Medicare Other

## 2018-03-01 ENCOUNTER — Ambulatory Visit (HOSPITAL_COMMUNITY): Payer: Medicare Other

## 2018-03-03 ENCOUNTER — Ambulatory Visit (HOSPITAL_COMMUNITY): Payer: Medicare Other

## 2018-03-05 ENCOUNTER — Ambulatory Visit (HOSPITAL_COMMUNITY): Payer: Medicare Other

## 2018-03-08 ENCOUNTER — Ambulatory Visit (HOSPITAL_COMMUNITY): Payer: Medicare Other

## 2018-03-10 ENCOUNTER — Ambulatory Visit (HOSPITAL_COMMUNITY): Payer: Medicare Other

## 2018-03-12 ENCOUNTER — Ambulatory Visit (HOSPITAL_COMMUNITY): Payer: Medicare Other

## 2018-03-15 ENCOUNTER — Ambulatory Visit (HOSPITAL_COMMUNITY): Payer: Medicare Other

## 2018-03-15 ENCOUNTER — Telehealth: Payer: Self-pay | Admitting: Cardiovascular Disease

## 2018-03-15 MED FILL — VALSARTAN 80 MG TABLET: 80 | 30 days supply | Qty: 30 | Fill #1

## 2018-03-15 MED FILL — CLOPIDOGREL 75 MG TABLET: 75 | 90 days supply | Qty: 90 | Fill #1

## 2018-03-15 NOTE — Telephone Encounter (Signed)
Informed patient she may try Coricidin and saline spray. Reiterated to her to call her PCP if symptoms do not improve. She was grateful for assistance.

## 2018-03-15 NOTE — Telephone Encounter (Signed)
  Patient is sick and wants to know what is safe for her to take.

## 2018-03-17 ENCOUNTER — Ambulatory Visit (HOSPITAL_COMMUNITY): Payer: Medicare Other

## 2018-03-19 ENCOUNTER — Ambulatory Visit (HOSPITAL_COMMUNITY): Payer: Medicare Other

## 2018-03-22 ENCOUNTER — Ambulatory Visit (HOSPITAL_COMMUNITY): Payer: Medicare Other

## 2018-03-24 ENCOUNTER — Ambulatory Visit (HOSPITAL_COMMUNITY): Payer: Medicare Other

## 2018-03-24 ENCOUNTER — Encounter: Payer: Self-pay | Admitting: Cardiovascular Disease

## 2018-03-24 ENCOUNTER — Ambulatory Visit: Payer: Medicare Other | Admitting: Cardiovascular Disease

## 2018-03-24 ENCOUNTER — Encounter (INDEPENDENT_AMBULATORY_CARE_PROVIDER_SITE_OTHER): Payer: Self-pay

## 2018-03-24 VITALS — BP 116/82 | HR 75 | Ht 61.0 in | Wt 162.4 lb

## 2018-03-24 DIAGNOSIS — I5022 Chronic systolic (congestive) heart failure: Secondary | ICD-10-CM

## 2018-03-24 DIAGNOSIS — E782 Mixed hyperlipidemia: Secondary | ICD-10-CM

## 2018-03-24 DIAGNOSIS — I2510 Atherosclerotic heart disease of native coronary artery without angina pectoris: Secondary | ICD-10-CM

## 2018-03-24 NOTE — Progress Notes (Signed)
Cardiology Office Note:    Date:  03/24/2018   ID:  [name], [name] Jan 05, 1941, MRN [id number]  PCP:  Steve Rattler, DO  Cardiologist:  Sherren Mocha, MD  Electrophysiologist:  None   Referring MD: Steve Rattler, DO   Chief Complaint  Patient presents with  . Coronary Artery Disease    History of Present Illness:    Melissa Gibbs is a 78 y.o. female with a hx of coronary artery disease, presenting for follow-up evaluation.  The patient initially presented in October 2019 with an inferoposterior STEMI treated with primary PCI of the left circumflex.  She underwent staged PCI of the LAD during her index hospitalization.  She did have atrial fibrillation at the time of her MI but no recurrence has been noted.  LVEF at the time of her infarct was estimated at 35 to 40%.  The patient is here alone today.  She is been doing well other than developing cold symptoms over the past week.  She complains of sinus congestion, sore throat, and watery eyes.  She otherwise has been fine from a cardiac perspective and denies chest pain, shortness of breath, or leg swelling.  She had some heart palpitations early in the morning a few weeks ago without associated symptoms.  These were transient.  She has had no lightheadedness or presyncope.  She is tolerating her medications well.  Past Medical History:  Diagnosis Date  . CAD (coronary artery disease) 11/20/2017   a. Dx STEMI on 11/20/2017 s/p staged PCI with DES to pLCx and DES to mid LAD  . Cataract   . Hyperlipidemia   . Hypertension   . Ischemic cardiomyopathy    a. Echo 11/22/2017 w/ LVEF of 35-40%, hypokinesis of basal and mid inferolateral, inferior, and apical inferior and lateral walls, and G1DD  . Myocardial infarction ([place])   . PAD (peripheral artery disease) ([place])    a. Right radial artery noted to be occluded during heart catheterization in 11/2017.  [name] Paroxysmal atrial fibrillation in setting of acute MI    a. Brief episode  of atrial fibrillation in setting of acute STEMI - no chronic anticoagulation needed unless recurrent episodes  . Shingles 2000   R flank, mild, self reported, did not present to care   . Skin cancer     Past Surgical History:  Procedure Laterality Date  . ABDOMINAL HYSTERECTOMY     fibroids   . CATARACT EXTRACTION, BILATERAL    . CORONARY STENT INTERVENTION N/A 11/20/2017   Procedure: CORONARY STENT INTERVENTION;  Surgeon: Sherren Mocha, MD;  Location: [facility];  Service: Cardiovascular;  Laterality: N/A;  . CORONARY STENT INTERVENTION N/A 11/23/2017   Procedure: CORONARY STENT INTERVENTION;  Surgeon: Burnell Blanks, MD;  Location: [facility];  Service: Cardiovascular;  Laterality: N/A;  . CORONARY/GRAFT ACUTE MI REVASCULARIZATION N/A 11/20/2017   Procedure: Coronary/Graft Acute MI Revascularization;  Surgeon: Sherren Mocha, MD;  Location: [facility];  Service: Cardiovascular;  Laterality: N/A;  . LEFT HEART CATH AND CORONARY ANGIOGRAPHY N/A 11/20/2017   Procedure: LEFT HEART CATH AND CORONARY ANGIOGRAPHY;  Surgeon: Sherren Mocha, MD;  Location: [facility];  Service: Cardiovascular;  Laterality: N/A;  . SKIN CANCER EXCISION  2014   [facility] regularly    Current Medications: Current Meds  Medication Sig  . aspirin 81 MG EC tablet Take 1 tablet (81 mg total) by mouth daily.  [name] atorvastatin (LIPITOR) 80 MG tablet  Take 1 tablet (80 mg total) by mouth daily at 6 PM.  . carvedilol (COREG) 3.125 MG tablet Take 1 tablet (3.125 mg total) by mouth 2 (two) times daily with a meal.  . clopidogrel (PLAVIX) 75 MG tablet Take 1 tablet (75 mg total) by mouth daily.  . nitroGLYCERIN (NITROSTAT) 0.4 MG SL tablet Place 1 tablet (0.4 mg total) under the tongue every 5 (five) minutes x 3 doses as needed for chest pain.  . valsartan (DIOVAN) 80 MG tablet Take 1 tablet (80 mg total) by mouth daily.     Allergies:   Amoxicillin   Social  History   Socioeconomic History  . Marital status: Divorced    Spouse name: Not on file  . Number of children: 1  . Years of education: 9  . Highest education level: Associate degree: academic program  Occupational History  . 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 Occupation: Public affairs consultant: [employer]  . Financial resource strain: Not very hard  . Food insecurity:    Worry: Never true    Inability: Never true  . Transportation needs:    Medical: No    Non-medical: No  Tobacco Use  . Smoking status: Former Smoker    Types: Cigarettes  . Smokeless tobacco: Never Used  . Tobacco comment: quit smoking at age 34  Substance and Sexual Activity  . Alcohol use: Yes    Comment: very rarely drinks a wine cooler  . Drug use: No  . Sexual activity: Never  Lifestyle  . Physical activity:    Days per week: 7 days    Minutes per session: 10 min  . Stress: Only a little  Relationships  . Social connections:    Talks on phone: More than three times a week    Gets together: Twice a week    Attends religious service: Never    Active member of club or organization: No    Attends meetings of clubs or organizations: Never    Relationship status: Divorced  Other Topics Concern  . Not on file  Social History Narrative   Divorced, lives alone.  One adult daughter, Melissa Gibbs.   Has 2 dogs and one cat. Walks the dogs daily, daughter helps. Has fenced in yard.   Lives in a one level home. Has a couple stairs to get in. No handrail. No grab bars in bathroom. Smoke alarms present.   Likes to work in the yard when its not so hot out. Used to work in Estate manager/land agent at [employer].      Likes to play solitaire, has taken classes at [employer], cross stitch, ceramics, oil painting.   Likes to eat vegetables, has cut out meat, no beef or pork in 20 years, stopped [place], chicken last year. Trying to cut out processed foods.   Likes to drink water.     Wears seat belt in vehicles.     Family History: The patient's family history includes Cancer in her father, sister, and sister; Colon cancer in her father; Heart disease in her brother; Heart disease (age of onset: 86) in her mother; Hypertension in her father; Lung cancer in her sister; Melanoma in her sister and another family member. There is no history of Stroke.  ROS:   Please see the history of present illness.    All other systems reviewed and are negative.  EKGs/Labs/Other Studies Reviewed:  The following studies were reviewed today: Echo 11-22-2017: Study Conclusions  - Left ventricle: The cavity size was normal. Systolic function was   moderately reduced. The estimated ejection fraction was in the   range of 35% to 40%. There is hypokinesis of the basal and mid   inferolateral, inferior and apical inferior and lateral walls.   Doppler parameters are consistent with abnormal left ventricular   relaxation (grade 1 diastolic dysfunction). Doppler parameters   are consistent with elevated ventricular end-diastolic filling   pressure. - Aortic valve: There was mild regurgitation. - Mitral valve: There was trivial regurgitation. - Left atrium: The atrium was mildly dilated. - Right ventricle: Systolic function was normal. - Right atrium: The atrium was normal in size. - Tricuspid valve: There was mild regurgitation. - Pulmonic valve: There was mild regurgitation. - Pulmonary arteries: Systolic pressure was mildly increased. PA   peak pressure: 33 mm Hg (S). - Inferior vena cava: The vessel was normal in size. - Pericardium, extracardiac: There was no pericardial effusion.  Cardiac Cath 11-20-2017: Conclusion     There is moderate left ventricular systolic dysfunction.  LV end diastolic pressure is mildly elevated.  Ost Cx to Prox Cx lesion is 100% stenosed.  Prox LAD lesion is 80% stenosed.  Mid LAD lesion is 80% stenosed.  Prox RCA lesion is 60%  stenosed.  A drug-eluting stent was successfully placed using a STENT SIERRA 4.00 X 15 MM.  Post intervention, there is a 0% residual stenosis.   1.  Acute inferoposterior MI secondary to acute occlusion of the proximal circumflex, treated successfully with PCI using a 4.0 x 15 mm Xience Sierra DES 2.  Severe proximal LAD stenosis and moderately severe mid to distal LAD stenosis 3.  Moderate RCA stenosis with a 60% lesion in the proximal to mid vessel 4.  Moderate segmental LV systolic dysfunction and a circumflex infarct pattern, LVEF estimated at 45%  Recommendations: Angiomax is discontinued at the completion of the procedure.  Post MI medical therapy will be instituted.  Consider staged intervention of the LAD on Monday in this patient has a high-grade proximal LAD stenosis.  Recommend uninterrupted dual antiplatelet therapy with Aspirin 81mg  daily and Ticagrelor 90mg  twice daily for a minimum of 12 months (ACS - Class I recommendation).   Cardiac Cath 11-23-2017: Conclusion     Prox LAD lesion is 80% stenosed.  Mid LAD lesion is 80% stenosed.  A drug-eluting stent was successfully placed using a STENT SIERRA 2.75 X 18 MM.  Post intervention, there is a 0% residual stenosis.   1. Severe stenosis mid LAD (staged PCI planned for today).  2. Successful PTCA/DES x 1 mid LAD  Recommendations: Likely discharge home tomorrow. Continue beta blocker and high intensity statin.  Recommend uninterrupted dual antiplatelet therapy with Aspirin 81mg  daily and Ticagrelor 90mg  twice daily for a minimum of 12 months (ACS - Class I recommendation).    EKG:  EKG is not ordered today.   Recent Labs: 11/21/2017: B Natriuretic Peptide 184.5 11/24/2017: BUN 11; Creatinine, Ser 0.87; Hemoglobin 10.7; Platelets 250; Potassium 3.3; Sodium 138 01/04/2018: ALT 10  Recent Lipid Panel    Component Value Date/Time   CHOL 116 01/04/2018 0731   TRIG 73 01/04/2018 0731   HDL 45 01/04/2018 0731    CHOLHDL 2.6 01/04/2018 0731   CHOLHDL 3.9 11/20/2017 2229   VLDL 19 11/20/2017 2229   LDLCALC 56 01/04/2018 0731   LDLDIRECT 139 (H) 08/28/2006 2217    Physical Exam:  VS:  BP 116/82   Pulse 75   Ht 5\' 1"  (1.549 m)   Wt 162 lb 6.4 oz (73.7 kg)   SpO2 97%   BMI 30.69 kg/m     Wt Readings from Last 3 Encounters:  03/24/18 162 lb 6.4 oz (73.7 kg)  12/29/17 162 lb 12.8 oz (73.8 kg)  12/09/17 161 lb 9.6 oz (73.3 kg)     GEN:  Well nourished, well developed in no acute distress HEENT: Normal NECK: No JVD; No carotid bruits LYMPHATICS: No lymphadenopathy CARDIAC: RRR, 2/6 systolic ejection murmur at the right upper sternal border RESPIRATORY:  Clear to auscultation without rales, wheezing or rhonchi  ABDOMEN: Soft, non-tender, non-distended MUSCULOSKELETAL:  No edema; No deformity  SKIN: Warm and dry NEUROLOGIC:  Alert and oriented x 3 PSYCHIATRIC:  Normal affect   ASSESSMENT:    1. Coronary artery disease involving native coronary artery of native heart without angina pectoris   2. Chronic systolic heart failure ([place])   3. Mixed hyperlipidemia    PLAN:    In order of problems listed above:  1. The patient is stable without further symptoms of angina.  She has undergone multivessel PCI in staged fashion at the time of her acute myocardial infarction now about 4 months ago.  The patient will continue on dual antiplatelet therapy with aspirin and clopidogrel.  I will arrange follow-up in 4 months with an APP.  Secondary risk reduction measures include a high intensity statin drug, beta-blocker, and an ARB.  She is counseled regarding diet and lifestyle modification with increased exercise. 2. The patient has no exertional symptoms and no clinical exam signs of volume excess.  I have recommended a repeat echocardiogram now that she is 4 months out from revascularization.  She will continue on carvedilol and valsartan. 3. Recent LDL cholesterol reviewed with a value of 56 mg/dL.   She is treated with atorvastatin 80 mg daily.  Lifestyle counseling is done.   Medication Adjustments/Labs and Tests Ordered: Current medicines are reviewed at length with the patient today.  Concerns regarding medicines are outlined above.  Orders Placed This Encounter  Procedures  . ECHOCARDIOGRAM COMPLETE   No orders of the defined types were placed in this encounter.   Patient Instructions  Medication Instructions:  Your provider recommends that you continue on your current medications as directed. Please refer to the Current Medication list given to you today.    Labwork: None  Testing/Procedures: Your provider has requested that you have an echocardiogram. Echocardiography is a painless test that uses sound waves to create images of your heart. It provides your doctor with information about the size and shape of your heart and how well your heart's chambers and valves are working. This procedure takes approximately one hour. There are no restrictions for this procedure. You are scheduled for your echocardiogram on 03/26/2018 at 7:30AM. Please arrive by 7:00AM for this procedure.  Follow-Up: You have a follow-up appointment scheduled with Dr. Antionette Char assistant Richardson Dopp on July 26, 2018 at 8:15AM.      Signed, Sherren Mocha, MD  03/24/2018 1:20 PM    [place]

## 2018-03-24 NOTE — Patient Instructions (Addendum)
Medication Instructions:  Your provider recommends that you continue on your current medications as directed. Please refer to the Current Medication list given to you today.    Labwork: None  Testing/Procedures: Your provider has requested that you have an echocardiogram. Echocardiography is a painless test that uses sound waves to create images of your heart. It provides your doctor with information about the size and shape of your heart and how well your heart's chambers and valves are working. This procedure takes approximately one hour. There are no restrictions for this procedure. You are scheduled for your echocardiogram on 03/26/2018 at 7:30AM. Please arrive by 7:00AM for this procedure.  Follow-Up: You have a follow-up appointment scheduled with Dr. Antionette Char assistant Richardson Dopp on July 26, 2018 at 8:15AM.

## 2018-03-26 ENCOUNTER — Ambulatory Visit (HOSPITAL_COMMUNITY): Payer: Medicare Other

## 2018-03-26 ENCOUNTER — Other Ambulatory Visit (HOSPITAL_COMMUNITY): Payer: Medicare Other

## 2018-03-29 ENCOUNTER — Ambulatory Visit (HOSPITAL_COMMUNITY): Payer: Medicare Other

## 2018-03-31 ENCOUNTER — Other Ambulatory Visit: Payer: Self-pay

## 2018-03-31 ENCOUNTER — Ambulatory Visit (INDEPENDENT_AMBULATORY_CARE_PROVIDER_SITE_OTHER): Payer: Medicare Other | Admitting: Family Medicine

## 2018-03-31 ENCOUNTER — Encounter: Payer: Self-pay | Admitting: Family Medicine

## 2018-03-31 ENCOUNTER — Ambulatory Visit: Payer: Medicare Other | Admitting: Family Medicine

## 2018-03-31 ENCOUNTER — Ambulatory Visit (HOSPITAL_COMMUNITY): Payer: Medicare Other

## 2018-03-31 DIAGNOSIS — B9789 Other viral agents as the cause of diseases classified elsewhere: Secondary | ICD-10-CM

## 2018-03-31 DIAGNOSIS — H6993 Unspecified Eustachian tube disorder, bilateral: Secondary | ICD-10-CM

## 2018-03-31 DIAGNOSIS — J988 Other specified respiratory disorders: Secondary | ICD-10-CM

## 2018-03-31 NOTE — Patient Instructions (Signed)
All of this seems viral.  You will get better with time. Please use the flonase prescribed last spring to open up your ears.   Of course, see [place] if you don't get better.

## 2018-03-31 NOTE — Progress Notes (Signed)
Established Patient Office Visit  Subjective:  Patient ID: Melissa Gibbs, female    DOB: October 28, 1940  Age: 78 y.o. MRN: [id number]  CC:  Chief Complaint  Patient presents with  . head cold    HPI Melissa Gibbs presents for head cold x 2 weeks.  Patient has bilateral muffled hearing/ear congestion, no ear pain, initial sore throat now normal, dry cough, no fever.  Main symptom is ear congestion. Denies SOB.  Past Medical History:  Diagnosis Date  . CAD (coronary artery disease) 11/20/2017   a. Dx STEMI on 11/20/2017 s/p staged PCI with DES to pLCx and DES to mid LAD  . Cataract   . Hyperlipidemia   . Hypertension   . Ischemic cardiomyopathy    a. Echo 11/22/2017 w/ LVEF of 35-40%, hypokinesis of basal and mid inferolateral, inferior, and apical inferior and lateral walls, and G1DD  . Myocardial infarction ([place])   . PAD (peripheral artery disease) ([place])    a. Right radial artery noted to be occluded during heart catheterization in 11/2017.  [name] Paroxysmal atrial fibrillation in setting of acute MI    a. Brief episode of atrial fibrillation in setting of acute STEMI - no chronic anticoagulation needed unless recurrent episodes  . Shingles 2000   R flank, mild, self reported, did not present to care   . Skin cancer     Past Surgical History:  Procedure Laterality Date  . ABDOMINAL HYSTERECTOMY     fibroids   . CATARACT EXTRACTION, BILATERAL    . CORONARY STENT INTERVENTION N/A 11/20/2017   Procedure: CORONARY STENT INTERVENTION;  Surgeon: Sherren Mocha, MD;  Location: [facility];  Service: Cardiovascular;  Laterality: N/A;  . CORONARY STENT INTERVENTION N/A 11/23/2017   Procedure: CORONARY STENT INTERVENTION;  Surgeon: Burnell Blanks, MD;  Location: [facility];  Service: Cardiovascular;  Laterality: N/A;  . CORONARY/GRAFT ACUTE MI REVASCULARIZATION N/A 11/20/2017   Procedure: Coronary/Graft Acute MI Revascularization;  Surgeon: Sherren Mocha, MD;   Location: [facility];  Service: Cardiovascular;  Laterality: N/A;  . LEFT HEART CATH AND CORONARY ANGIOGRAPHY N/A 11/20/2017   Procedure: LEFT HEART CATH AND CORONARY ANGIOGRAPHY;  Surgeon: Sherren Mocha, MD;  Location: [facility];  Service: Cardiovascular;  Laterality: N/A;  . SKIN CANCER EXCISION  2014   Sees [facility] regularly    Family History  Problem Relation Age of Onset  . Heart disease Mother 5       MI , rheumatic fever as a child, and heart murmur  . Cancer Father        knee cancer  . Colon cancer Father   . Hypertension Father   . Cancer Sister        melanoma - died at 16  . Melanoma Sister   . Heart disease Brother   . Melanoma Other        died at age 55  . Cancer Sister   . Lung cancer Sister   . Stroke Neg Hx     Social History   Socioeconomic History  . Marital status: Divorced    Spouse name: Not on file  . Number of children: 1  . Years of education: 17  . Highest education level: Associate degree: academic program  Occupational History  . 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 Occupation: Public affairs consultant: [employer]  .  Financial resource strain: Not very hard  . Food insecurity:    Worry: Never true    Inability: Never true  . Transportation needs:    Medical: No    Non-medical: No  Tobacco Use  . Smoking status: Former Smoker    Types: Cigarettes  . Smokeless tobacco: Never Used  . Tobacco comment: quit smoking at age 30  Substance and Sexual Activity  . Alcohol use: Yes    Comment: very rarely drinks a wine cooler  . Drug use: No  . Sexual activity: Never  Lifestyle  . Physical activity:    Days per week: 7 days    Minutes per session: 10 min  . Stress: Only a little  Relationships  . Social connections:    Talks on phone: More than three times a week    Gets together: Twice a week    Attends religious service: Never     Active member of club or organization: No    Attends meetings of clubs or organizations: Never    Relationship status: Divorced  . Intimate partner violence:    Fear of current or ex partner: No    Emotionally abused: No    Physically abused: No    Forced sexual activity: No  Other Topics Concern  . Not on file  Social History Narrative   Divorced, lives alone.  One adult daughter, [name].   Has 2 dogs and one cat. Walks the dogs daily, daughter helps. Has fenced in yard.   Lives in a one level home. Has a couple stairs to get in. No handrail. No grab bars in bathroom. Smoke alarms present.   Likes to work in the yard when its not so hot out. Used to work in Estate manager/land agent at [employer].      Likes to play solitaire, has taken classes at [employer], cross stitch, ceramics, oil painting.   Likes to eat vegetables, has cut out meat, no beef or pork in 20 years, stopped [place], chicken last year. Trying to cut out processed foods.   Likes to drink water.    Wears seat belt in vehicles.    Outpatient Medications Prior to Visit  Medication Sig Dispense Refill  . aspirin 81 MG EC tablet Take 1 tablet (81 mg total) by mouth daily.    [name] atorvastatin (LIPITOR) 80 MG tablet Take 1 tablet (80 mg total) by mouth daily at 6 PM. 90 tablet 3  . carvedilol (COREG) 3.125 MG tablet Take 1 tablet (3.125 mg total) by mouth 2 (two) times daily with a meal. 180 tablet 3  . clopidogrel (PLAVIX) 75 MG tablet Take 1 tablet (75 mg total) by mouth daily. 90 tablet 3  . nitroGLYCERIN (NITROSTAT) 0.4 MG SL tablet Place 1 tablet (0.4 mg total) under the tongue every 5 (five) minutes x 3 doses as needed for chest pain. 30 tablet 12  . valsartan (DIOVAN) 80 MG tablet Take 1 tablet (80 mg total) by mouth daily. 90 tablet 3   No facility-administered medications prior to visit.     Allergies  Allergen Reactions  . Amoxicillin Diarrhea    Has patient had a PCN reaction causing immediate rash, facial/tongue/throat swelling, SOB or  lightheadedness with hypotension: NO Has patient had a PCN reaction causing severe rash involving mucus membranes or skin necrosis: NO Has patient had a PCN reaction that required hospitalization: NO Has patient had a PCN reaction occurring within the last 10 years: NO  If all of the above answers  are "NO", then may proceed with Cephalosporin use.    ROS Review of Systems    Objective:    Physical Exam  BP 126/80   Pulse 83   Temp 98.3 F (36.8 C) (Oral)   Ht 5\' 1"  (1.549 m)   Wt 162 lb 6.4 oz (73.7 kg)   SpO2 96%   BMI 30.69 kg/m  Wt Readings from Last 3 Encounters:  03/31/18 162 lb 6.4 oz (73.7 kg)  03/24/18 162 lb 6.4 oz (73.7 kg)  12/29/17 162 lb 12.8 oz (73.8 kg)  TMs bilateral retracted TMs  Not red Throat normal Neck no sig adenopathy. Lungs clear.     There are no preventive care reminders to display for this patient.  There are no preventive care reminders to display for this patient.  Lab Results  Component Value Date   TSH 1.174 08/28/2006   Lab Results  Component Value Date   WBC 10.6 (H) 11/24/2017   HGB 10.7 (L) 11/24/2017   HCT 34.3 (L) 11/24/2017   MCV 90.0 11/24/2017   PLT 250 11/24/2017   Lab Results  Component Value Date   NA 138 11/24/2017   K 3.3 (L) 11/24/2017   CO2 24 11/24/2017   GLUCOSE 94 11/24/2017   BUN 11 11/24/2017   CREATININE 0.87 11/24/2017   BILITOT 0.5 01/04/2018   ALKPHOS 97 01/04/2018   AST 16 01/04/2018   ALT 10 01/04/2018   PROT 7.1 01/04/2018   ALBUMIN 4.2 01/04/2018   CALCIUM 8.7 (L) 11/24/2017   ANIONGAP 7 11/24/2017   Lab Results  Component Value Date   CHOL 116 01/04/2018   Lab Results  Component Value Date   HDL 45 01/04/2018   Lab Results  Component Value Date   LDLCALC 56 01/04/2018   Lab Results  Component Value Date   TRIG 73 01/04/2018   Lab Results  Component Value Date   CHOLHDL 2.6 01/04/2018   No results found for: HGBA1C    Assessment & Plan:   Problem List Items  Addressed This Visit    Eustachian tube disorder, bilateral    flonase         No orders of the defined types were placed in this encounter.   Follow-up: No follow-ups on file.    Zenia Resides, MD

## 2018-03-31 NOTE — Assessment & Plan Note (Signed)
This is the underlying problem.  Symptomatic care and expectant observation

## 2018-03-31 NOTE — Assessment & Plan Note (Addendum)
URI has caused.  Will Rx with flonase, which she has at home from last spring.

## 2018-04-01 ENCOUNTER — Ambulatory Visit (HOSPITAL_COMMUNITY): Payer: Medicare Other | Attending: Internal Medicine

## 2018-04-01 DIAGNOSIS — I5022 Chronic systolic (congestive) heart failure: Secondary | ICD-10-CM | POA: Insufficient documentation

## 2018-04-02 ENCOUNTER — Ambulatory Visit (HOSPITAL_COMMUNITY): Payer: Medicare Other

## 2018-04-05 ENCOUNTER — Ambulatory Visit (HOSPITAL_COMMUNITY): Payer: Medicare Other

## 2018-04-07 ENCOUNTER — Ambulatory Visit (HOSPITAL_COMMUNITY): Payer: Medicare Other

## 2018-04-09 ENCOUNTER — Ambulatory Visit (HOSPITAL_COMMUNITY): Payer: Medicare Other

## 2018-04-12 ENCOUNTER — Ambulatory Visit (HOSPITAL_COMMUNITY): Payer: Medicare Other

## 2018-04-14 ENCOUNTER — Ambulatory Visit (HOSPITAL_COMMUNITY): Payer: Medicare Other

## 2018-04-16 ENCOUNTER — Ambulatory Visit (HOSPITAL_COMMUNITY): Payer: Medicare Other

## 2018-04-17 MED FILL — VALSARTAN 80 MG TABLET: 80 | 30 days supply | Qty: 30 | Fill #2 | Status: TO

## 2018-04-19 ENCOUNTER — Ambulatory Visit (HOSPITAL_COMMUNITY): Payer: Medicare Other

## 2018-04-21 ENCOUNTER — Ambulatory Visit (HOSPITAL_COMMUNITY): Payer: Medicare Other

## 2018-04-23 ENCOUNTER — Ambulatory Visit (HOSPITAL_COMMUNITY): Payer: Medicare Other

## 2018-04-26 ENCOUNTER — Ambulatory Visit (HOSPITAL_COMMUNITY): Payer: Medicare Other

## 2018-04-28 ENCOUNTER — Ambulatory Visit (HOSPITAL_COMMUNITY): Payer: Medicare Other

## 2018-04-30 ENCOUNTER — Ambulatory Visit (HOSPITAL_COMMUNITY): Payer: Medicare Other

## 2018-05-03 ENCOUNTER — Ambulatory Visit (HOSPITAL_COMMUNITY): Payer: Medicare Other

## 2018-05-05 ENCOUNTER — Ambulatory Visit (HOSPITAL_COMMUNITY): Payer: Medicare Other

## 2018-05-07 ENCOUNTER — Ambulatory Visit (HOSPITAL_COMMUNITY): Payer: Medicare Other

## 2018-05-10 ENCOUNTER — Ambulatory Visit (HOSPITAL_COMMUNITY): Payer: Medicare Other

## 2018-05-12 ENCOUNTER — Ambulatory Visit (HOSPITAL_COMMUNITY): Payer: Medicare Other

## 2018-05-18 ENCOUNTER — Other Ambulatory Visit: Payer: Self-pay

## 2018-05-18 MED FILL — VALSARTAN 80 MG TABS: 80 | 30 days supply | Qty: 30 | Fill #0

## 2018-05-18 NOTE — Patient Outreach (Signed)
[facility] [facility]) Care Management  05/18/2018  CHEVY VIRGO 1940-08-21 [id number]   Medication Adherence call to Mrs. Eveline Keto spoke with patient. patient explain she will pick up this time from local pharmacy and then after she will ask doctor to send it to [facility]  patient has enough until next appointment. Mrs. Vanderloop is showing past due on Valsartan 80 mg under [place].   [place] Management Direct Dial [phone]  Fax [phone] [email or web address]

## 2018-06-16 ENCOUNTER — Other Ambulatory Visit: Payer: Self-pay | Admitting: Cardiovascular Disease

## 2018-06-16 MED FILL — CLOPIDOGREL 75 MG TABLET: 75 | 90 days supply | Qty: 90 | Fill #0

## 2018-06-16 NOTE — Telephone Encounter (Signed)
Discussed further with pharmd. The patient had issues with Losartan. She also had issue with ACEI.  Called to switch from Valsartan to Irbesartan 75 mg daily.  Left message to call back.

## 2018-06-16 NOTE — Telephone Encounter (Signed)
ARB availability is hit or miss depending on the pharmacy - equivalent doses would be irbesartan 75mg  daily, olmesartan 20mg  daily, or losartan 50mg  daily. Any of these options would be appropriate. Would advise pt to monitor BP after ARB switch to ensure BP remains stable.

## 2018-06-16 NOTE — Telephone Encounter (Signed)
Pharmacy called again to document Valsartan being out of stock.

## 2018-06-16 NOTE — Telephone Encounter (Signed)
[name] called pt and pt stated that the pharmacy stated that the medication Valsartan is on backorder and pharmacy would like for Dr. Burt Knack to sent in an alternative, so pt can get medication. Please address

## 2018-06-17 MED ORDER — IRBESARTAN 75 MG PO TABS: 75.0000 mg | ORAL_TABLET | Freq: Every day | ORAL | 3 refills | Status: DC

## 2018-06-17 NOTE — Telephone Encounter (Signed)
Pt calling back to discuss the medication change. Pt would like a call back at [phone]. Please address

## 2018-06-17 NOTE — Telephone Encounter (Signed)
I spoke to the patient and informed her that we would be starting her on Irbesartan 75 mg daily.  She verbalized understanding and thanked me for the call.

## 2018-07-20 ENCOUNTER — Other Ambulatory Visit: Payer: Self-pay | Admitting: Cardiovascular Disease

## 2018-07-20 MED ORDER — CLOPIDOGREL BISULFATE 75 MG PO TABS: 75.0000 mg | ORAL_TABLET | Freq: Every day | ORAL | 2 refills | Status: DC

## 2018-07-22 DIAGNOSIS — L720 Epidermal cyst: Secondary | ICD-10-CM | POA: Diagnosis not present

## 2018-07-22 DIAGNOSIS — Z85828 Personal history of other malignant neoplasm of skin: Secondary | ICD-10-CM | POA: Diagnosis not present

## 2018-07-22 DIAGNOSIS — L821 Other seborrheic keratosis: Secondary | ICD-10-CM | POA: Diagnosis not present

## 2018-07-22 DIAGNOSIS — L738 Other specified follicular disorders: Secondary | ICD-10-CM | POA: Diagnosis not present

## 2018-07-25 NOTE — Progress Notes (Signed)
Cardiology Office Note:    Date:  07/26/2018   ID:  Tracie, Dore [birth year], MRN [id number]  PCP:  Steve Rattler, DO  Cardiologist:  Sherren Mocha, MD   Electrophysiologist:  None   Referring MD: Steve Rattler, DO   Chief Complaint  Patient presents with  . Follow-up    CAD    History of Present Illness:    Melissa Gibbs is a 78 y.o. female with:  Coronary artery disease  Inferoposterior STEMI 10/19: DES to the LCx; staged DES to the LAD  Complicated by atrial fibrillation  Ischemic cardiomyopathy  EF 35-40  Hypertension  Hyperlipidemia  Melissa Gibbs was last seen by Dr. Burt Knack in February 2020.  Follow-up echocardiogram at that time demonstrated improved LV function with an EF of 60-65%.  She has here alone today.  Since last seen, she denies having chest discomfort, significant shortness of breath, lower extremity swelling, dizziness or syncope.  She denies having any bleeding issues.  She is trying to eat a plant-based diet.  She notes that she needs to increase her activity more.  She feels that she is too sedentary.  Prior CV studies:   The following studies were reviewed today:  Echocardiogram 04/01/2018 EF 60-65, mild LVH, grade 1 diastolic dysfunction, mild aortic valve sclerosis, trivial AI  Percutaneous coronary intervention 11/23/2017 PCI: 2.75 x 18 mm Sierra DES to mid LAD  Echocardiogram 11/22/2017 EF 35-40, inferolateral/inferior/apical inferior/lateral HK, grade 1 diastolic dysfunction, mild AI, trivial MR mild LAE, mild TR, mild PI, PASP 33  Cardiac catheterization 11/20/2017 LAD proximal 80, mid-distal 80 LCx ostial 100 RCA proximal 60 EF 45, mid inferior and inferoapical akinesis PCI: 4 x 15 mm Sierra DES to the ostial LCx  Past Medical History:  Diagnosis Date  . CAD (coronary artery disease) 11/20/2017   a. Dx STEMI on 11/20/2017 s/p staged PCI with DES to pLCx and DES to mid LAD  . Cataract   . Hyperlipidemia   .  Hypertension   . Ischemic cardiomyopathy    a. Echo 11/22/2017 w/ LVEF of 35-40%, hypokinesis of basal and mid inferolateral, inferior, and apical inferior and lateral walls, and G1DD  . Myocardial infarction ([place])   . PAD (peripheral artery disease) ([place])    a. Right radial artery noted to be occluded during heart catheterization in 11/2017.  [name] Paroxysmal atrial fibrillation in setting of acute MI    a. Brief episode of atrial fibrillation in setting of acute STEMI - no chronic anticoagulation needed unless recurrent episodes  . Shingles 2000   R flank, mild, self reported, did not present to care   . Skin cancer    Surgical Hx: The patient  has a past surgical history that includes Abdominal hysterectomy; Skin cancer excision (2014); Coronary/Graft Acute MI Revascularization (N/A, 11/20/2017); LEFT HEART CATH AND CORONARY ANGIOGRAPHY (N/A, 11/20/2017); CORONARY STENT INTERVENTION (N/A, 11/20/2017); CORONARY STENT INTERVENTION (N/A, 11/23/2017); and Cataract extraction, bilateral.   Current Medications: Current Meds  Medication Sig  . aspirin 81 MG EC tablet Take 1 tablet (81 mg total) by mouth daily.  [name] atorvastatin (LIPITOR) 80 MG tablet Take 1 tablet (80 mg total) by mouth daily at 6 PM.  . carvedilol (COREG) 3.125 MG tablet Take 1 tablet (3.125 mg total) by mouth 2 (two) times daily with a meal.  . clopidogrel (PLAVIX) 75 MG tablet Take 1 tablet (75 mg total) by mouth daily.  . irbesartan (AVAPRO) 75 MG tablet Take 1 tablet (75 mg  total) by mouth daily.  . nitroGLYCERIN (NITROSTAT) 0.4 MG SL tablet Place 1 tablet (0.4 mg total) under the tongue every 5 (five) minutes x 3 doses as needed for chest pain.     Allergies:   Amoxicillin   Social History   Tobacco Use  . Smoking status: Former Smoker    Types: Cigarettes  . Smokeless tobacco: Never Used  . Tobacco comment: quit smoking at age 17  Substance Use Topics  . Alcohol use: Yes    Comment: very rarely drinks a wine cooler   . Drug use: No     Family Hx: The patient's family history includes Cancer in her father, sister, and sister; Colon cancer in her father; Heart disease in her brother; Heart disease (age of onset: 82) in her mother; Hypertension in her father; Lung cancer in her sister; Melanoma in her sister and another family member. There is no history of Stroke.  ROS:   Please see the history of present illness.    ROS All other systems reviewed and are negative.   EKGs/Labs/Other Test Reviewed:    EKG:  EKG is   ordered today.  The ekg ordered today demonstrates sinus bradycardia, heart rate 58, normal axis, low voltage, QTC 396, similar to prior tracing 12/07/2017  Recent Labs: 11/21/2017: B Natriuretic Peptide 184.5 11/24/2017: BUN 11; Creatinine, Ser 0.87; Hemoglobin 10.7; Platelets 250; Potassium 3.3; Sodium 138 01/04/2018: ALT 10   Recent Lipid Panel Lab Results  Component Value Date/Time   CHOL 116 01/04/2018 07:31 AM   TRIG 73 01/04/2018 07:31 AM   HDL 45 01/04/2018 07:31 AM   CHOLHDL 2.6 01/04/2018 07:31 AM   CHOLHDL 3.9 11/20/2017 10:29 PM   LDLCALC 56 01/04/2018 07:31 AM   LDLDIRECT 139 (H) 08/28/2006 10:17 PM     Physical Exam:    VS:  BP 130/82   Pulse (!) 58   Ht 5\' 1"  (1.549 m)   Wt 164 lb 1.9 oz (74.4 kg)   SpO2 96%   BMI 31.01 kg/m     Wt Readings from Last 3 Encounters:  07/26/18 164 lb 1.9 oz (74.4 kg)  03/31/18 162 lb 6.4 oz (73.7 kg)  03/24/18 162 lb 6.4 oz (73.7 kg)     Physical Exam  Constitutional: She is oriented to person, place, and time. She appears well-developed and well-nourished. No distress.  HENT:  Head: Normocephalic and atraumatic.  Eyes: No scleral icterus.  Neck: No JVD present. No thyromegaly present.  Cardiovascular: Normal rate, regular rhythm and normal heart sounds.  No murmur heard. Pulmonary/Chest: Effort normal and breath sounds normal. She has no rales.  Abdominal: Soft. There is no hepatomegaly.  Musculoskeletal:         General: No edema.  Lymphadenopathy:    She has no cervical adenopathy.  Neurological: She is alert and oriented to person, place, and time.  Skin: Skin is warm and dry.  Psychiatric: She has a normal mood and affect.    ASSESSMENT & PLAN:    1. Coronary artery disease involving native coronary artery of native heart without angina pectoris History of myocardial infarction in October 2018 treated with DES to the LCx and staged DES to the LAD.  She remains on dual antiplatelet therapy, high-dose statin and beta-blocker therapy.  She is doing well without anginal symptoms.  Continue current therapy.  2. Ischemic cardiomyopathy EF was previously 35-40%.  Most recent echocardiogram in February 2020 demonstrates improved LV function with an EF of 60-65%.  Continue beta-blocker, ARB therapy.  3. Essential hypertension Fair control.  She already eats a plant-based diet.  I have encouraged her to continue to increase her activity.  Continue current dose of carvedilol and irbesartan.  4. Hyperlipidemia LDL goal <70 LDL optimal on most recent lab work.  Continue current Rx.     Dispo:  Return in about 4 months (around 11/25/2018) for Routine Follow Up, w/ Dr. Burt Knack.   Medication Adjustments/Labs and Tests Ordered: Current medicines are reviewed at length with the patient today.  Concerns regarding medicines are outlined above.  Tests Ordered: No orders of the defined types were placed in this encounter.  Medication Changes: No orders of the defined types were placed in this encounter.   Signed, Richardson Dopp, PA-C  07/26/2018 8:59 AM    [facility] [address], [place], [place]  [address] Phone: [phone]; Fax: [phone]

## 2018-07-26 ENCOUNTER — Encounter: Payer: Self-pay | Admitting: Physician Assistant

## 2018-07-26 ENCOUNTER — Ambulatory Visit (INDEPENDENT_AMBULATORY_CARE_PROVIDER_SITE_OTHER): Payer: Medicare Other | Admitting: Physician Assistant

## 2018-07-26 ENCOUNTER — Other Ambulatory Visit: Payer: Self-pay

## 2018-07-26 VITALS — BP 130/82 | HR 58 | Ht 61.0 in | Wt 164.1 lb

## 2018-07-26 DIAGNOSIS — E785 Hyperlipidemia, unspecified: Secondary | ICD-10-CM

## 2018-07-26 DIAGNOSIS — I255 Ischemic cardiomyopathy: Secondary | ICD-10-CM | POA: Diagnosis not present

## 2018-07-26 DIAGNOSIS — I2510 Atherosclerotic heart disease of native coronary artery without angina pectoris: Secondary | ICD-10-CM | POA: Diagnosis not present

## 2018-07-26 DIAGNOSIS — I10 Essential (primary) hypertension: Secondary | ICD-10-CM | POA: Diagnosis not present

## 2018-07-26 NOTE — Addendum Note (Signed)
Addended by: Jeremy Johann on: 07/26/2018 12:53 PM   Modules accepted: Orders

## 2018-07-26 NOTE — Patient Instructions (Signed)
Medication Instructions:  Your physician recommends that you continue on your current medications as directed. Please refer to the Current Medication list given to you today.  If you need a refill on your cardiac medications before your next appointment, please call your pharmacy.   Lab work: NONE ORDERED  TODAY   If you have labs (blood work) drawn today and your tests are completely normal, you will receive your results only by: [name] MyChart Message (if you have MyChart) OR . A paper copy in the mail If you have any lab test that is abnormal or we need to change your treatment, we will call you to review the results.  Testing/Procedures: NONE ORDERED  TODAY    Follow-Up: At [facility], you and your health needs are our priority.  As part of our continuing mission to provide you with exceptional heart care, we have created designated Provider Care Teams.  These Care Teams include your primary Cardiologist (physician) and Advanced Practice Providers (APPs -  Physician Assistants and Nurse Practitioners) who all work together to provide you with the care you need, when you need it. You will need a follow up appointment in:  4 months.  Please call our office 2 months in advance to schedule this appointment.  You may see Sherren Mocha, MD or one of the following Advanced Practice Providers on your designated Care Team: Richardson Dopp, PA-C [place], [place] . Daune Perch, NP  Any Other Special Instructions Will Be Listed Below (If Applicable).

## 2018-07-27 NOTE — Addendum Note (Signed)
Addended by: Jeremy Johann on: 07/27/2018 07:53 AM   Modules accepted: Orders

## 2018-11-24 ENCOUNTER — Telehealth (INDEPENDENT_AMBULATORY_CARE_PROVIDER_SITE_OTHER): Payer: Medicare Other | Admitting: Family Medicine

## 2018-11-24 ENCOUNTER — Encounter: Payer: Self-pay | Admitting: Family Medicine

## 2018-11-24 ENCOUNTER — Other Ambulatory Visit: Payer: Self-pay

## 2018-11-24 DIAGNOSIS — I25110 Atherosclerotic heart disease of native coronary artery with unstable angina pectoris: Secondary | ICD-10-CM

## 2018-11-24 DIAGNOSIS — I10 Essential (primary) hypertension: Secondary | ICD-10-CM

## 2018-11-24 NOTE — Progress Notes (Addendum)
[place] Telemedicine Visit Patient consented to have virtual visit. Method of visit: Telephone  Encounter participants: Patient: Melissa Gibbs - located at home Provider: [place] Russell Quinney - located at [facility]  Others (if applicable): n/a  Chief Complaint: check in to see if she needs to come in and questioning flu shot  HPI: Patient is calling because she wants to know if she needs to come in.  She is curious if whether or not she should have a flu shot.  She is also concerned if she needs to get another pneumonia vaccine.  CAD Patient reports that she has follow-up with her cardiologist in December and she has noticed that she has been bruising more easily than normal.  She states that currently she is taking aspirin and Plavix as instructed by her cardiologist.  We did look through cardiology notes and it appears that patient was supposed to be on dual antiplatelet therapy for at least 1 year.  Instructed her to call her cardiologist in order to discuss further given that she does I will not want to come in to many doctors office visits in the setting of the Covid pandemic.  Hypertension She has been checking her blood pressures at home with yesterday her blood pressure being 174/95 and on recheck in the afternoon was 149/84.  Patient states the first blood pressure was taken before she is taking her medications.  Her previous blood pressures were recorded at 142/86 and another was recorded at 139/80.  Patient denies any shortness of breath, chest pain, leg swelling, headaches, change in vision.  ROS: per HPI  Pertinent PMHx: PAD, Paroxysmal A Fib, HTN, HLD, CAD  Exam:  Respiratory: able to speak in complete sentences without issue  BP: 174/95, 149/84 142/86 139/80  Assessment/Plan:  Hypertension Patient reports compliance with irbesartan 75 and Coreg 3.25 mg twice daily.  Patient is to call her cardiologist in order to discuss moving up her appointment.  She  does not want to come into multiple doctors offices so she will simply go to their office as it is already scheduled.  No red flags at this time and most blood pressures were within normal range per her home cuff.  She was encouraged to continue monitoring and keep a log of her blood pressure in order to discuss with her cardiologist  Coronary artery disease involving native coronary artery of native heart with unstable angina pectoris [facility]) Patient has follow-up with cardiologist as discussed under hypertension and she will move up this appointment.  She would like to discuss her dual antiplatelet therapy as she is starting to bruise more.  She is currently on aspirin and Plavix, it has been 1 year since her MI.   Time spent during visit with patient: 15 minutes  [place] Lyriq Finerty, DO PGY-3, [place]

## 2018-11-25 ENCOUNTER — Other Ambulatory Visit: Payer: Self-pay | Admitting: Cardiovascular Disease

## 2018-11-25 NOTE — Assessment & Plan Note (Signed)
Patient has follow-up with cardiologist as discussed under hypertension and she will move up this appointment.  She would like to discuss her dual antiplatelet therapy as she is starting to bruise more.  She is currently on aspirin and Plavix, it has been 1 year since her MI.

## 2018-11-25 NOTE — Assessment & Plan Note (Signed)
Patient reports compliance with irbesartan 75 and Coreg 3.25 mg twice daily.  Patient is to call her cardiologist in order to discuss moving up her appointment.  She does not want to come into multiple doctors offices so she will simply go to their office as it is already scheduled.  No red flags at this time and most blood pressures were within normal range per her home cuff.  She was encouraged to continue monitoring and keep a log of her blood pressure in order to discuss with her cardiologist

## 2018-12-08 ENCOUNTER — Encounter: Payer: Self-pay | Admitting: Cardiovascular Disease

## 2018-12-08 ENCOUNTER — Ambulatory Visit: Payer: Medicare Other | Admitting: Cardiovascular Disease

## 2018-12-08 ENCOUNTER — Other Ambulatory Visit: Payer: Self-pay

## 2018-12-08 VITALS — BP 174/102 | HR 77 | Ht 61.0 in | Wt 160.1 lb

## 2018-12-08 DIAGNOSIS — I10 Essential (primary) hypertension: Secondary | ICD-10-CM

## 2018-12-08 DIAGNOSIS — I2510 Atherosclerotic heart disease of native coronary artery without angina pectoris: Secondary | ICD-10-CM

## 2018-12-08 DIAGNOSIS — E782 Mixed hyperlipidemia: Secondary | ICD-10-CM | POA: Diagnosis not present

## 2018-12-08 MED ORDER — CARVEDILOL 6.25 MG PO TABS: 6.2500 mg | ORAL_TABLET | Freq: Two times a day (BID) | ORAL | 3 refills | Status: DC

## 2018-12-08 NOTE — Progress Notes (Signed)
Cardiology Office Note:    Date:  12/11/2018   ID:  Melissa Gibbs 07-18-1940, MRN [id number]  PCP:  Shirley, [place], DO  Cardiologist:  Sherren Mocha, MD  Electrophysiologist:  None   Referring MD: Shirley, [place], DO   Chief Complaint  Patient presents with  . Coronary Artery Disease    History of Present Illness:    Melissa Gibbs is a 78 y.o. female with a hx of coronary artery disease, presenting for follow-up evaluation.  The patient initially presented in October 2019 with an inferoposterior STEMI treated with primary PCI of the left circumflex.  She underwent staged PCI of the LAD during her index hospitalization.  She did have atrial fibrillation at the time of her MI but no recurrence has been noted.  LVEF at the time of her infarct was estimated at 35 to 40%, but LV function normalized on follow-up echo.  The patient is here alone today.  She notes that her blood pressure is severely elevated but she has been very stressed by the COVID-19 pandemic and this weeks presidential election.  Her blood pressure has been elevated at home but not nearly to this degree.  She otherwise has no specific cardiac complaints.  She denies heart palpitations, chest pain, shortness of breath, leg swelling, lightheadedness, or headache.  She is had no visual changes.  Past Medical History:  Diagnosis Date  . ACS (acute coronary syndrome) ([place]) 11/20/2017  . CAD (coronary artery disease) 11/20/2017   a. Dx STEMI on 11/20/2017 s/p staged PCI with DES to pLCx and DES to mid LAD  . Cataract   . Hyperlipidemia   . Hypertension   . Ischemic cardiomyopathy    a. Echo 11/22/2017 w/ LVEF of 35-40%, hypokinesis of basal and mid inferolateral, inferior, and apical inferior and lateral walls, and G1DD  . Myocardial infarction ([place])   . PAD (peripheral artery disease) ([place])    a. Right radial artery noted to be occluded during heart catheterization in 11/2017.  [name] Paroxysmal atrial fibrillation in  setting of acute MI    a. Brief episode of atrial fibrillation in setting of acute STEMI - no chronic anticoagulation needed unless recurrent episodes  . Shingles 2000   R flank, mild, self reported, did not present to care   . Skin cancer     Past Surgical History:  Procedure Laterality Date  . ABDOMINAL HYSTERECTOMY     fibroids   . CATARACT EXTRACTION, BILATERAL    . CORONARY STENT INTERVENTION N/A 11/20/2017   Procedure: CORONARY STENT INTERVENTION;  Surgeon: Sherren Mocha, MD;  Location: [facility];  Service: Cardiovascular;  Laterality: N/A;  . CORONARY STENT INTERVENTION N/A 11/23/2017   Procedure: CORONARY STENT INTERVENTION;  Surgeon: Burnell Blanks, MD;  Location: [facility];  Service: Cardiovascular;  Laterality: N/A;  . CORONARY/GRAFT ACUTE MI REVASCULARIZATION N/A 11/20/2017   Procedure: Coronary/Graft Acute MI Revascularization;  Surgeon: Sherren Mocha, MD;  Location: [facility];  Service: Cardiovascular;  Laterality: N/A;  . LEFT HEART CATH AND CORONARY ANGIOGRAPHY N/A 11/20/2017   Procedure: LEFT HEART CATH AND CORONARY ANGIOGRAPHY;  Surgeon: Sherren Mocha, MD;  Location: [facility];  Service: Cardiovascular;  Laterality: N/A;  . SKIN CANCER EXCISION  2014   [facility] regularly    Current Medications: Current Meds  Medication Sig  . atorvastatin (LIPITOR) 80 MG tablet TAKE 1 TABLET BY MOUTH  DAILY AT 6 PM  . carvedilol (COREG) 6.25  MG tablet Take 1 tablet (6.25 mg total) by mouth 2 (two) times daily with a meal.  . clopidogrel (PLAVIX) 75 MG tablet Take 1 tablet (75 mg total) by mouth daily.  . irbesartan (AVAPRO) 75 MG tablet Take 1 tablet (75 mg total) by mouth daily.  . nitroGLYCERIN (NITROSTAT) 0.4 MG SL tablet Place 1 tablet (0.4 mg total) under the tongue every 5 (five) minutes x 3 doses as needed for chest pain.  . [DISCONTINUED] aspirin 81 MG EC tablet Take 1 tablet (81 mg total) by mouth daily.   . [DISCONTINUED] carvedilol (COREG) 3.125 MG tablet TAKE 1 TABLET BY MOUTH  TWICE A DAY WITH MEALS     Allergies:   Amoxicillin   Social History   Socioeconomic History  . Marital status: Divorced    Spouse name: Not on file  . Number of children: 1  . Years of education: 5  . Highest education level: Associate degree: academic program  Occupational History  . 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 Occupation: Public affairs consultant: [employer]  . Financial resource strain: Not very hard  . Food insecurity    Worry: Never true    Inability: Never true  . Transportation needs    Medical: No    Non-medical: No  Tobacco Use  . Smoking status: Former Smoker    Types: Cigarettes  . Smokeless tobacco: Never Used  . Tobacco comment: quit smoking at age 89  Substance and Sexual Activity  . Alcohol use: Yes    Comment: very rarely drinks a wine cooler  . Drug use: No  . Sexual activity: Never  Lifestyle  . Physical activity    Days per week: 7 days    Minutes per session: 10 min  . Stress: Only a little  Relationships  . Social Herbalist on phone: More than three times a week    Gets together: Twice a week    Attends religious service: Never    Active member of club or organization: No    Attends meetings of clubs or organizations: Never    Relationship status: Divorced  Other Topics Concern  . Not on file  Social History Narrative   Divorced, lives alone.  One adult daughter, [name].   Has 2 dogs and one cat. Walks the dogs daily, daughter helps. Has fenced in yard.   Lives in a one level home. Has a couple stairs to get in. No handrail. No grab bars in bathroom. Smoke alarms present.   Likes to work in the yard when its not so hot out. Used to work in Estate manager/land agent at [employer].      Likes to play solitaire, has taken classes at [employer], cross stitch, ceramics, oil painting.   Likes to eat vegetables,  has cut out meat, no beef or pork in 20 years, stopped [place], chicken last year. Trying to cut out processed foods.   Likes to drink water.    Wears seat belt in vehicles.     Family History: The patient's family history includes Cancer in her father, sister, and sister; Colon cancer in her father; Heart disease in her brother; Heart disease (age of onset: 30) in her mother; Hypertension in her father; Lung cancer in her sister; Melanoma in her sister and another family member. There is no history of Stroke.  ROS:   Please see the  history of present illness.    All other systems reviewed and are negative.  EKGs/Labs/Other Studies Reviewed:    The following studies were reviewed today: Echo 04/01/2017: IMPRESSIONS    1. The left ventricle has normal systolic function with an ejection fraction of 60-65%. The cavity size was normal. There is mildly increased left ventricular wall thickness. Left ventricular diastolic Doppler parameters are consistent with impaired  relaxation Indeterminent filling pressures The E/e' is 8-15. No evidence of left ventricular regional wall motion abnormalities.  2. The right ventricle has normal systolic function. The cavity was normal. There is no increase in right ventricular wall thickness.  3. The mitral valve is normal in structure.  4. The tricuspid valve is normal in structure.  5. The aortic valve is tricuspid Mild sclerosis of the aortic valve. Aortic valve regurgitation is trivial by color flow Doppler.  6. No evidence of left ventricular regional wall motion abnormalities.  7. When compared to the prior study: Compared to a prior study in 11/2017, the LVEF has improved from 35-40% up to 60-65%.  EKG:  EKG is not ordered today.   Recent Labs: 01/04/2018: ALT 10  Recent Lipid Panel    Component Value Date/Time   CHOL 116 01/04/2018 0731   TRIG 73 01/04/2018 0731   HDL 45 01/04/2018 0731   CHOLHDL 2.6 01/04/2018 0731   CHOLHDL 3.9 11/20/2017  2229   VLDL 19 11/20/2017 2229   LDLCALC 56 01/04/2018 0731   LDLDIRECT 139 (H) 08/28/2006 2217    Physical Exam:    VS:  BP (!) 174/102   Pulse 77   Ht 5\' 1"  (1.549 m)   Wt 160 lb 1.9 oz (72.6 kg)   SpO2 96%   BMI 30.25 kg/m     Wt Readings from Last 3 Encounters:  12/08/18 160 lb 1.9 oz (72.6 kg)  07/26/18 164 lb 1.9 oz (74.4 kg)  03/31/18 162 lb 6.4 oz (73.7 kg)     GEN:  Well nourished, well developed in no acute distress HEENT: Normal NECK: No JVD; No carotid bruits LYMPHATICS: No lymphadenopathy CARDIAC: RRR, no murmurs, rubs, gallops RESPIRATORY:  Clear to auscultation without rales, wheezing or rhonchi  ABDOMEN: Soft, non-tender, non-distended MUSCULOSKELETAL:  No edema; No deformity  SKIN: Warm and dry NEUROLOGIC:  Alert and oriented x 3 PSYCHIATRIC:  Normal affect   ASSESSMENT:    1. Coronary artery disease involving native coronary artery of native heart without angina pectoris   2. Mixed hyperlipidemia   3. Essential hypertension    PLAN:   1.  The patient is stable without symptoms of angina.  She is 12 months out from her MI.  I advised her to stop aspirin and continue on clopidogrel 75 mg daily. 2.  Treated with a high intensity statin drug.  Most recent lipids reviewed.  Last LDL cholesterol 56 mg/dL. 3.  Blood pressure is uncontrolled.  Much of this is related to stress.  Advised her to increase carvedilol to 6.25 mg twice daily.  Likely will need further increase.  She will call in 2 weeks with blood pressure readings.  She has no symptoms of hypertensive urgency with no headache, vision changes, chest pain, or other current complaints.  Medication Adjustments/Labs and Tests Ordered: Current medicines are reviewed at length with the patient today.  Concerns regarding medicines are outlined above.  No orders of the defined types were placed in this encounter.  Meds ordered this encounter  Medications  . carvedilol (COREG) 6.25  MG tablet    Sig:  Take 1 tablet (6.25 mg total) by mouth 2 (two) times daily with a meal.    Dispense:  180 tablet    Refill:  3    Requesting 1 year supply    Patient Instructions  Medication Instructions:  1) INCREASE CARVEDILOL to 6.25 mg twice daily 2) STOP ASPIRIN *If you need a refill on your cardiac medications before your next appointment, please call your pharmacy*  Follow-Up: At [facility], you and your health needs are our priority.  As part of our continuing mission to provide you with exceptional heart care, we have created designated Provider Care Teams.  These Care Teams include your primary Cardiologist (physician) and Advanced Practice Providers (APPs -  Physician Assistants and Nurse Practitioners) who all work together to provide you with the care you need, when you need it.  Your next appointment:   You are scheduled with Richardson Dopp on Jun 07, 2019 at 10:45AM.  Other Instructions Please contact our office in a couple weeks with some blood pressure readings.     Signed, Sherren Mocha, MD  12/11/2018 9:29 AM    [facility]

## 2018-12-08 NOTE — Patient Instructions (Signed)
Medication Instructions:  1) INCREASE CARVEDILOL to 6.25 mg twice daily 2) STOP ASPIRIN *If you need a refill on your cardiac medications before your next appointment, please call your pharmacy*  Follow-Up: At [facility], you and your health needs are our priority.  As part of our continuing mission to provide you with exceptional heart care, we have created designated Provider Care Teams.  These Care Teams include your primary Cardiologist (physician) and Advanced Practice Providers (APPs -  Physician Assistants and Nurse Practitioners) who all work together to provide you with the care you need, when you need it.  Your next appointment:   You are scheduled with Melissa Gibbs on Jun 07, 2019 at 10:45AM.  Other Instructions Please contact our office in a couple weeks with some blood pressure readings.

## 2018-12-11 ENCOUNTER — Encounter: Payer: Self-pay | Admitting: Cardiovascular Disease

## 2019-02-01 ENCOUNTER — Ambulatory Visit: Payer: Medicare Other | Admitting: Cardiovascular Disease

## 2019-02-24 ENCOUNTER — Telehealth: Payer: Self-pay | Admitting: Physician Assistant

## 2019-02-24 NOTE — Telephone Encounter (Signed)
New message:     Patient calling and would like for some one to call concering some medication. Please call patient.

## 2019-02-24 NOTE — Telephone Encounter (Signed)
The patient states her NTG is expired and she'd like to know if she should get more.  Recommended she get a refill so she has non expired medication should she need it. She was grateful for assistance.

## 2019-02-25 ENCOUNTER — Other Ambulatory Visit: Payer: Self-pay | Admitting: Cardiovascular Disease

## 2019-02-25 MED ORDER — NITROGLYCERIN 0.4 MG SL SUBL: 0.4000 mg | SUBLINGUAL_TABLET | SUBLINGUAL | 10 refills | Status: DC | PRN

## 2019-02-25 MED FILL — NITROGLYCERIN 0.4 MG TAB SL: 0.4 | 5 days supply | Qty: 25 | Fill #0

## 2019-02-25 NOTE — Telephone Encounter (Signed)
Pt's medication was sent to pt's pharmacy as requested. Confirmation received.  °

## 2019-03-04 ENCOUNTER — Other Ambulatory Visit: Payer: Self-pay | Admitting: Cardiovascular Disease

## 2019-04-03 ENCOUNTER — Ambulatory Visit: Payer: Medicare Other | Attending: Internal Medicine

## 2019-04-03 DIAGNOSIS — Z23 Encounter for immunization: Secondary | ICD-10-CM | POA: Insufficient documentation

## 2019-04-03 NOTE — Progress Notes (Signed)
   Covid-19 Vaccination Clinic  Name:  Melissa Gibbs    MRN: [id number] DOB: [birth year]  04/03/2019  [name] was observed post Covid-19 immunization for 15 minutes without incidence. She was provided with Vaccine Information Sheet and instruction to access the V-Safe system.   [name] was instructed to call 911 with any severe reactions post vaccine: [name] Difficulty breathing  . Swelling of your face and throat  . A fast heartbeat  . A bad rash all over your body  . Dizziness and weakness    Immunizations Administered    Name Date Dose VIS Date Route   Pfizer COVID-19 Vaccine 04/03/2019  3:22 PM 0.3 mL 01/14/2019 Intramuscular   Manufacturer: [place]   Lot: [id number]   [place]: [id number]

## 2019-05-03 ENCOUNTER — Ambulatory Visit: Payer: Medicare Other | Attending: Internal Medicine

## 2019-05-03 DIAGNOSIS — Z23 Encounter for immunization: Secondary | ICD-10-CM

## 2019-05-03 NOTE — Progress Notes (Signed)
   Covid-19 Vaccination Clinic  Name:  KJERSTI DIEBOLD    MRN: [id number] DOB: 25-Sep-1940  05/03/2019  Ms. Alessandro was observed post Covid-19 immunization for 15 minutes without incident. She was provided with Vaccine Information Sheet and instruction to access the V-Safe system.   Ms. Domina was instructed to call 911 with any severe reactions post vaccine: [name] Difficulty breathing  . Swelling of face and throat  . A fast heartbeat  . A bad rash all over body  . Dizziness and weakness   Immunizations Administered    Name Date Dose VIS Date Route   Pfizer COVID-19 Vaccine 05/03/2019 11:56 AM 0.3 mL 01/14/2019 Intramuscular   Manufacturer: [place]   Lot: [id number]   [place]: [id number]

## 2019-05-11 ENCOUNTER — Telehealth: Payer: Self-pay

## 2019-05-11 ENCOUNTER — Other Ambulatory Visit: Payer: Self-pay

## 2019-05-11 ENCOUNTER — Ambulatory Visit: Payer: Medicare Other | Admitting: Family Medicine

## 2019-05-11 DIAGNOSIS — L237 Allergic contact dermatitis due to plants, except food: Secondary | ICD-10-CM

## 2019-05-11 MED ORDER — LEVOCETIRIZINE DIHYDROCHLORIDE 5 MG PO TABS: 5.0000 mg | ORAL_TABLET | Freq: Every evening | ORAL | 0 refills | Status: DC

## 2019-05-11 NOTE — Telephone Encounter (Signed)
Patient calls nurse line regarding poison ivy breakout on face and around eyes. Patient spoke with pharmacist that recommended she contact PCP for appropriate medication that she could use around eyes.   Spoke with Dr. Owens Shark who recommended patient be seen in office.   FYI to PCP and Kris Mouton (ATC provider)  Talbot Grumbling, RN

## 2019-05-11 NOTE — Patient Instructions (Signed)
It was great seeing you today!  I believe you are having moderate reaction to poison ivy or a similar type of plant found in your yard.  I would continue to use the topical creams on your arms and hands.  To help with the facial and systemic manifestations were going to give you a histamine blocker.  It looks like your insurance will cover Arial.  In case they do not cover it I did give you some good Rx coupons the can use at local pharmacies to make this a lot cheaper for you.  Please come back and see [place] if symptoms do not improve.

## 2019-05-12 ENCOUNTER — Telehealth: Payer: Self-pay

## 2019-05-12 MED ORDER — LEVOCETIRIZINE DIHYDROCHLORIDE 5 MG PO TABS: 5.0000 mg | ORAL_TABLET | Freq: Every evening | ORAL | 0 refills | Status: DC

## 2019-05-12 MED FILL — LEVOCETIRIZINE 5 MG TABLET: 5 | 30 days supply | Qty: 30 | Fill #0

## 2019-05-12 NOTE — Telephone Encounter (Signed)
Patient calls nurse line stating the outpatient does not have medication. Per chart review looks like the xyzal was "set to print." I sent in the prescription to [facility] outpatient.

## 2019-05-13 ENCOUNTER — Encounter: Payer: Self-pay | Admitting: Family Medicine

## 2019-05-13 DIAGNOSIS — L237 Allergic contact dermatitis due to plants, except food: Secondary | ICD-10-CM | POA: Insufficient documentation

## 2019-05-13 NOTE — Assessment & Plan Note (Signed)
Dermatitis likely secondary to poison ivy or other plantlife causing type 4 hypersensitivity reaction. Mild-moderate case so will prescribe second generation h1 blocker. Can use topical corticosteroids she already has athome. Can follow up in 2-3 days if no improved, sooner if worsening and can prescribe oral steroids if situation arises.

## 2019-05-13 NOTE — Progress Notes (Signed)
   CHIEF COMPLAINT / HPI: 79 year old female who presents with two day history of pruritis, erythema, and mild swelling of her bilateral hands, forearms, and upper eyelids. She was working out in her gardens and believes that she had an interaction with poison ivy as she has had similar symptoms in the past. She states that she has poison ivy present over her entire property. She was not using any gloves or arm protection in the form of sleeves while gardening. She feels that she did rub her face and her eyes after contact with the offending plant  PERTINENT  PMH / PSH:    OBJECTIVE: BP (!) 150/70   Pulse 71   Wt 162 lb 12.8 oz (73.8 kg)   SpO2 95%   BMI 30.76 kg/m   Gen: 79 year old caucasian female, no acute distress, comfortable HEENT: mildly swollen bilateral upper eyelids, mild erythema noted, patient with large amount of ptosis at baseline, which is mildly increased on exam today CV: skin warm and dry Resp: no accessory muscle use, no distress Neuro: Alert and oriented, Speech clear, No gross deficits  Bilateral arms: erythematous, linear rash noted bilateral arms and hands. Mild excoriation noted.          ASSESSMENT / PLAN:  Poison ivy dermatitis Dermatitis likely secondary to poison ivy or other plantlife causing type 4 hypersensitivity reaction. Mild-moderate case so will prescribe second generation h1 blocker. Can use topical corticosteroids she already has athome. Can follow up in 2-3 days if no improved, sooner if worsening and can prescribe oral steroids if situation arises.     Guadalupe Dawn MD PGY-3 Family Medicine Resident [place]

## 2019-06-06 NOTE — Progress Notes (Signed)
Cardiology Office Note:    Date:  06/07/2019   ID:  Willeen, Novak 09/29/1940, MRN [id number]  PCP:  Shirley, [place], DO  Cardiologist:  Sherren Mocha, MD  Electrophysiologist:  None   Referring MD: Shirley, [place], DO   Chief Complaint:  Follow-up (CAD, HTN)    Patient Profile:    Melissa Gibbs is a 79 y.o. female with:   Coronary artery disease ? Inferoposterior STEMI 10/19: DES to the LCx; staged DES to the LAD  Complicated by atrial fibrillation  Ischemic cardiomyopathy ? EF 35-40 ? Echocardiogram 03/2018: EF 60-65  Hypertension  Hyperlipidemia  Peripheral Arterial Disease    Prior CV studies: Echocardiogram 04/01/2018 EF 60-65, mild LVH, grade 1 diastolic dysfunction, mild aortic valve sclerosis, trivial AI  Percutaneous coronary intervention 11/23/2017 PCI: 2.75 x 18 mm Sierra DES to mid LAD  Echocardiogram 11/22/2017 EF 35-40, inferolateral/inferior/apical inferior/lateral HK, grade 1 diastolic dysfunction, mild AI, trivial MR mild LAE, mild TR, mild PI, PASP 33  Cardiac catheterization 11/20/2017 LAD proximal 80, mid-distal 80 LCx ostial 100 RCA proximal 60 EF 45, mid inferior and inferoapical akinesis PCI: 4 x 15 mm Sierra DES to the ostial LCx  History of Present Illness:    Melissa Gibbs was last seen in 12/2018 by Dr. Burt Knack.  Her Carvedilol was adjusted for better BP control.  She returns for follow up.  She is here alone.  Overall, she has been doing well.  She has not had chest discomfort or significant shortness of breath.  She has not had syncope, leg swelling or orthopnea.  She notes that she does not exercise very much.  She would like to resume walking.  Past Medical History:  Diagnosis Date  . ACS (acute coronary syndrome) ([place]) 11/20/2017  . CAD (coronary artery disease) 11/20/2017   a. Dx STEMI on 11/20/2017 s/p staged PCI with DES to pLCx and DES to mid LAD  . Cataract   . Hyperlipidemia   . Hypertension   . Ischemic  cardiomyopathy    a. Echo 11/22/2017 w/ LVEF of 35-40%, hypokinesis of basal and mid inferolateral, inferior, and apical inferior and lateral walls, and G1DD  . Myocardial infarction ([place])   . PAD (peripheral artery disease) ([place])    a. Right radial artery noted to be occluded during heart catheterization in 11/2017.  [name] Paroxysmal atrial fibrillation in setting of acute MI    a. Brief episode of atrial fibrillation in setting of acute STEMI - no chronic anticoagulation needed unless recurrent episodes  . Shingles 2000   R flank, mild, self reported, did not present to care   . Skin cancer     Current Medications: Current Meds  Medication Sig  . atorvastatin (LIPITOR) 80 MG tablet TAKE 1 TABLET BY MOUTH  DAILY AT 6 PM  . clopidogrel (PLAVIX) 75 MG tablet TAKE 1 TABLET BY MOUTH  DAILY  . levocetirizine (XYZAL) 5 MG tablet Take 1 tablet (5 mg total) by mouth every evening.  . nitroGLYCERIN (NITROSTAT) 0.4 MG SL tablet Place 1 tablet (0.4 mg total) under the tongue every 5 (five) minutes x 3 doses as needed for chest pain.  . [DISCONTINUED] carvedilol (COREG) 6.25 MG tablet Take 1 tablet (6.25 mg total) by mouth 2 (two) times daily with a meal.  . [DISCONTINUED] irbesartan (AVAPRO) 75 MG tablet TAKE 1 TABLET BY MOUTH  DAILY     Allergies:   Amoxicillin   Social History   Tobacco Use  . Smoking status: Former  Smoker    Types: Cigarettes  . Smokeless tobacco: Never Used  . Tobacco comment: quit smoking at age 18  Substance Use Topics  . Alcohol use: Yes    Comment: very rarely drinks a wine cooler  . Drug use: No     Family Hx: The patient's family history includes Cancer in her father, sister, and sister; Colon cancer in her father; Heart disease in her brother; Heart disease (age of onset: 47) in her mother; Hypertension in her father; Lung cancer in her sister; Melanoma in her sister and another family member. There is no history of Stroke.  ROS   EKGs/Labs/Other Test  Reviewed:    EKG:  EKG is  ordered today.  The ekg ordered today demonstrates sinus bradycardia, HR 54, normal axis, possible anterior Q waves, QTC 390, no change since prior tracing  Recent Labs: No results found for requested labs within last 8760 hours.   Recent Lipid Panel Lab Results  Component Value Date/Time   CHOL 116 01/04/2018 07:31 AM   TRIG 73 01/04/2018 07:31 AM   HDL 45 01/04/2018 07:31 AM   CHOLHDL 2.6 01/04/2018 07:31 AM   CHOLHDL 3.9 11/20/2017 10:29 PM   LDLCALC 56 01/04/2018 07:31 AM   LDLDIRECT 139 (H) 08/28/2006 10:17 PM    Physical Exam:    VS:  BP (!) 158/88   Pulse (!) 54   Ht '5\' 1"'$  (1.549 m)   Wt 159 lb (72.1 kg)   SpO2 94%   BMI 30.04 kg/m     Wt Readings from Last 3 Encounters:  06/07/19 159 lb (72.1 kg)  05/11/19 162 lb 12.8 oz (73.8 kg)  12/08/18 160 lb 1.9 oz (72.6 kg)     Constitutional:      Appearance: Healthy appearance. Not in distress.  Neck:     Thyroid: No thyromegaly.     Vascular: JVD normal.  Pulmonary:     Breath sounds: No wheezing. No rales.  Cardiovascular:     Normal rate. Regular rhythm. Normal S1. Normal S2.     Murmurs: There is no murmur.  Pulses:    Intact distal pulses.  Edema:    Peripheral edema absent.  Abdominal:     Palpations: Abdomen is soft. There is no hepatomegaly.  Skin:    General: Skin is warm and dry.  Neurological:     General: No focal deficit present.     Mental Status: Alert and oriented to person, place and time.      ASSESSMENT & PLAN:    1. Coronary artery disease involving native coronary artery of native heart without angina pectoris History of STEMI in October 2018 treated with DES to the LCx and staged DES to the LAD.  She is doing well without anginal symptoms.  She is now on a single antiplatelet agent with clopidogrel.  Continue current therapy which includes a torque statin, carvedilol, clopidogrel.  2. Essential hypertension Blood pressure remains above goal.  Continue  current dose of carvedilol.  Increase irbesartan to 150 mg daily.  Obtain c-Met in 2 weeks.  Follow-up with me via telemedicine in 3 months.  3. Ischemic cardiomyopathy EF recovered to normal by echocardiogram in February 2020.  Continue current dose of carvedilol.  Adjust irbesartan as noted.  4. Mixed hyperlipidemia Continue high intensity statin therapy.  Arrange follow-up fasting c-Met, lipids.    Dispo:  Return for Routine Follow Up, w/ Richardson Dopp, PA-C, via Telemedicine.   Medication Adjustments/Labs and Tests Ordered: Current  medicines are reviewed at length with the patient today.  Concerns regarding medicines are outlined above.  Tests Ordered: Orders Placed This Encounter  Procedures  . Comp Met (CMET)  . Lipid panel  . EKG 12-Lead   Medication Changes: Meds ordered this encounter  Medications  . irbesartan (AVAPRO) 150 MG tablet    Sig: Take 1 tablet (150 mg total) by mouth daily.    Dispense:  90 tablet    Refill:  3    Signed, Richardson Dopp, PA-C  06/07/2019 11:30 AM    [facility] [place], [place], [place]  [address] Phone: [phone]; Fax: [phone]

## 2019-06-07 ENCOUNTER — Encounter: Payer: Self-pay | Admitting: Physician Assistant

## 2019-06-07 ENCOUNTER — Ambulatory Visit: Payer: Medicare Other | Admitting: Physician Assistant

## 2019-06-07 ENCOUNTER — Encounter: Payer: Self-pay | Admitting: *Deleted

## 2019-06-07 ENCOUNTER — Telehealth: Payer: Self-pay | Admitting: *Deleted

## 2019-06-07 ENCOUNTER — Other Ambulatory Visit: Payer: Self-pay

## 2019-06-07 VITALS — BP 158/88 | HR 54 | Ht 61.0 in | Wt 159.0 lb

## 2019-06-07 DIAGNOSIS — I10 Essential (primary) hypertension: Secondary | ICD-10-CM

## 2019-06-07 DIAGNOSIS — I255 Ischemic cardiomyopathy: Secondary | ICD-10-CM | POA: Diagnosis not present

## 2019-06-07 DIAGNOSIS — I2510 Atherosclerotic heart disease of native coronary artery without angina pectoris: Secondary | ICD-10-CM | POA: Diagnosis not present

## 2019-06-07 DIAGNOSIS — E782 Mixed hyperlipidemia: Secondary | ICD-10-CM

## 2019-06-07 DIAGNOSIS — I25110 Atherosclerotic heart disease of native coronary artery with unstable angina pectoris: Secondary | ICD-10-CM

## 2019-06-07 MED ORDER — IRBESARTAN 150 MG PO TABS: 150.0000 mg | ORAL_TABLET | Freq: Every day | ORAL | 3 refills | Status: DC

## 2019-06-07 NOTE — Patient Instructions (Addendum)
Medication Instructions:  Your physician has recommended you make the following change in your medication:  1.  INCREASE Irbesartan to 150 mg daily.  You may take 2 of the 75 mg tablets at a time. I have sent in a new prescription to [facility]  *If you need a refill on your cardiac medications before your next appointment, please call your pharmacy*   Lab Work: 2 WEEKS 06/21/19:  FASTING CMET & LIPIDS (nothing to eat or drink after midnight the night before) Our lab opens at 7:30.. You can come anytime after that  If you have labs (blood work) drawn today and your tests are completely normal, you will receive your results only by: [name] MyChart Message (if you have MyChart) OR . A paper copy in the mail If you have any lab test that is abnormal or we need to change your treatment, we will call you to review the results.   Testing/Procedures: None ordered   Follow-Up: At [facility], you and your health needs are our priority.  As part of our continuing mission to provide you with exceptional heart care, we have created designated Provider Care Teams.  These Care Teams include your primary Cardiologist (physician) and Advanced Practice Providers (APPs -  Physician Assistants and Nurse Practitioners) who all work together to provide you with the care you need, when you need it.  We recommend signing up for the patient portal called "MyChart".  Sign up information is provided on this After Visit Summary.  MyChart is used to connect with patients for Virtual Visits (Telemedicine).  Patients are able to view lab/test results, encounter notes, upcoming appointments, etc.  Non-urgent messages can be sent to your provider as well.   To learn more about what you can do with MyChart, go to [email or web address].    Your next appointment:   3 month(s)  The format for your next appointment:   Virtual  Provider:   You may see Sherren Mocha, MD or one of the following Advanced Practice Providers  on your designated Care Team:    Richardson Dopp, PA-C  Robbie Lis, [place]    Other Instructions  Your [facility] team (Cardiologist [facility] Doctor] and Advanced Practice Provider [phone] Assistant; Nurse Practitioner]) has arranged for your next office appointment to be a virtual visit (also known as "Telehealth", "Telemedicine", "E-Visit").   We now offer virtual visits for all our patients.  This helps [place] to expand our ability to see patients in a timely and safe manner.  These visits are billed to your insurance just like traditional, in person, appointments.  Please review this IMPORTANT information about your upcoming appointment.   **PLEASE READ THE SECTION BELOW LABELED "CONSENT".**   **CALL OUR OFFICE WITH QUESTIONS.**   WHAT YOU NEED FOR YOUR VIRTUAL VISIT:  You will need a SmartPhone with microphone and video capability.  [If you are using MyChart to connect to your visit, it is also possible to use a desktop/laptop computer (with an Internet connection), as long as you have microphone and video capability.]  You will need to use Chrome, Edge or [employer] as your Wellsite geologist.  We highly recommend that you have a MyChart account as this will make connecting to your visit seamless.  A MyChart account not only allows you to connect to your provider for a virtual visit, but also allows you to see the results of all your tests, provider notes, medications and upcoming appointments.    A MyChart account is not  absolutely necessary.  We can still complete your visit if you do not have one.    If you do not have a computer or SmartPhone with video/microphone capability or your Internet/cell service is weak on the day of your visit, we will do your visit by telephone.    A blood pressure cuff and scale are essential to collect your vital signs at home.  If you do not have these and you are unable to obtain them, please contact our office so that we can make arrangements for  you.  If you have a pulse oximeter, Apple watch, Kardia mobile device, etc, you can collect data from these devices as well to share with the provider for your visit.  These devices are not required for a virtual visit.    WHAT TO DO ON THE DAY OF YOUR APPOINTMENT: 30 minutes before your appointment:  Take your blood pressure, pulse or heart rate (if your blood pressure machine is able to collect it) and weight.  Write all these numbers down so you can give it to the nurse or medical assistant that calls.  Get all of the medications you currently take and put them where you will be sitting for the appointment.  The nurse/medical assistant will go over these with you when he/she calls.  15 minutes before your appointment:  You will receive a phone call from a nurse or medical assistant from our office.  The caller ID on your phone may indicate that the caller is either "[facility]" or "[place]".  However, the number may come across as spam.  Please turn off any spam blocker so you do not miss the call.  The nurse will:  Ask for your blood pressure, pulse, weight, height.  Go over all of your medications to make sure your chart is correct.  Review your allergies, smoking history, reason for appointment, etc.   Give you instructions on how to connect with the video platform or telephone.  IF YOUR VISIT IS BY TELEPHONE ONLY: After the nurse finishes getting you ready for the visit, your provider will call you on the phone number you provide to [place].   TO CONNECT WITH YOUR PROVIDER FOR YOUR APPOINTMENT (BY VIDEO): You will either connect with the provider with your MyChart account or (if you are not using MyChart) with a link sent to your SmartPhone by text message.  If you are using MyChart, see below.  If you are not using MyChart, the nurse will send you the text message during the phone call.     If you are connecting with your MyChart account:   (The nurse that calls you will  tell you when to do this):  You will log into your account. At the top of your home screen, you should see the following prompt that tells you to "Begin your video visit with . . . ".  Click the green button (BEGIN VISIT).    The next screen will say "It's time to start your video visit!" Click the green button (BEGIN VIDEO VISIT)    There may be a screen that appears that asks for permission to use your camera and/or microphone.  Click ALLOW.  This will open the browser where your appointment will take place.   You may see the message: "Welcome.  Waiting for the call to begin."  Or, you will see that the nurse or your provider are already "in" the room waiting on you.   If you  are connecting with a link sent to your SmartPhone via text: The nurse that calls you will send the link by text message. Click on the link (it should look something like this):     If asked to give permission to use the camera and or microphone, click ALLOW This will open the browser where your appointment will take place.      You may see the message: "Welcome.  Waiting for the call to begin."  Or, you will see that the nurse or your provider are already "in" the room waiting on you.    The controls for your visit look like the picture below.  Please note that this is what the microphone and camera look like when they are ON.  If muted, they will have a line through them.    After the appointment: Once your provider leaves the appointment, he/she will go over instructions with the nurse/medical assistant.  If needed, this person will call you with any instructions, appointments, etc.   A copy of your After Visit Summary (AVS) will be available later that day in your MyChart account.  This document will have all of your instructions, medications, appointments, etc.  If you are not using MyChart, we will mail it to your home.     *CONSENT FOR TELE-HEALTH VISIT - PLEASE REVIEW* By participating in the  scheduled virtual visit (and any virtual visit scheduled within 365 days of the printing of this document), I agree to the following:   I hereby voluntarily request, consent and authorize [facility] and its employed or contracted physicians, physician assistants, nurse practitioners or other licensed health care professionals (the Practitioner), to provide me with telemedicine health care services (the "Services") as deemed necessary by the treating Practitioner. I acknowledge and consent to receive the Services by the Practitioner via telemedicine. I understand that the telemedicine visit will involve communicating with the Practitioner through live audiovisual communication technology and the disclosure of certain medical information by electronic transmission. I acknowledge that I have been given the opportunity to request an in-person assessment or other available alternative prior to the telemedicine visit and am voluntarily participating in the telemedicine visit.  I understand that I have the right to withhold or withdraw my consent to the use of telemedicine in the course of my care at any time, without affecting my right to future care or treatment, and that the Practitioner or I may terminate the telemedicine visit at any time. I understand that I have the right to inspect all information obtained and/or recorded in the course of the telemedicine visit and may receive copies of available information for a reasonable fee.  I understand that some of the potential risks of receiving the Services via telemedicine include:  [name] Delay or interruption in medical evaluation due to technological equipment failure or disruption; . Information transmitted may not be sufficient (e.g. poor resolution of images) to allow for appropriate medical decision making by the Practitioner; and/or  . In rare instances, security protocols could fail, causing a breach of personal health information.  Furthermore, I  acknowledge that it is my responsibility to provide information about my medical history, conditions and care that is complete and accurate to the best of my ability. I acknowledge that Practitioner's advice, recommendations, and/or decision may be based on factors not within their control, such as incomplete or inaccurate data provided by me or distortions of diagnostic images or specimens that may result from electronic transmissions. I understand that  the practice of medicine is not an exact science and that Practitioner makes no warranties or guarantees regarding treatment outcomes. I acknowledge that a copy of this consent can be made available to me via my patient portal ([place]), or I can request a printed copy by calling the office of [place].    I understand that my insurance will be billed for this visit.   I have read or had this consent read to me. . I understand the contents of this consent, which adequately explains the benefits and risks of the Services being provided via telemedicine.  . I have been provided ample opportunity to ask questions regarding this consent and the Services and have had my questions answered to my satisfaction. . I give my informed consent for the services to be provided through the use of telemedicine in my medical care   Keep a log of your blood pressure for 2 weeks prior to your follow up appointment in August so we can get the information from you on your appointment.

## 2019-06-07 NOTE — Telephone Encounter (Signed)
  Patient Consent for Virtual Visit         Melissa Gibbs has provided verbal consent on 06/07/2019 for a virtual visit (video or telephone).   CONSENT FOR VIRTUAL VISIT FOR:  Melissa Gibbs  By participating in this virtual visit I agree to the following:  I hereby voluntarily request, consent and authorize [facility] and its employed or contracted physicians, physician assistants, nurse practitioners or other licensed health care professionals (the Practitioner), to provide me with telemedicine health care services (the "Services") as deemed necessary by the treating Practitioner. I acknowledge and consent to receive the Services by the Practitioner via telemedicine. I understand that the telemedicine visit will involve communicating with the Practitioner through live audiovisual communication technology and the disclosure of certain medical information by electronic transmission. I acknowledge that I have been given the opportunity to request an in-person assessment or other available alternative prior to the telemedicine visit and am voluntarily participating in the telemedicine visit.  I understand that I have the right to withhold or withdraw my consent to the use of telemedicine in the course of my care at any time, without affecting my right to future care or treatment, and that the Practitioner or I may terminate the telemedicine visit at any time. I understand that I have the right to inspect all information obtained and/or recorded in the course of the telemedicine visit and may receive copies of available information for a reasonable fee.  I understand that some of the potential risks of receiving the Services via telemedicine include:  [name] Delay or interruption in medical evaluation due to technological equipment failure or disruption; . Information transmitted may not be sufficient (e.g. poor resolution of images) to allow for appropriate medical decision making by the Practitioner;  and/or  . In rare instances, security protocols could fail, causing a breach of personal health information.  Furthermore, I acknowledge that it is my responsibility to provide information about my medical history, conditions and care that is complete and accurate to the best of my ability. I acknowledge that Practitioner's advice, recommendations, and/or decision may be based on factors not within their control, such as incomplete or inaccurate data provided by me or distortions of diagnostic images or specimens that may result from electronic transmissions. I understand that the practice of medicine is not an exact science and that Practitioner makes no warranties or guarantees regarding treatment outcomes. I acknowledge that a copy of this consent can be made available to me via my patient portal ([place]), or I can request a printed copy by calling the office of [place].    I understand that my insurance will be billed for this visit.   I have read or had this consent read to me. . I understand the contents of this consent, which adequately explains the benefits and risks of the Services being provided via telemedicine.  . I have been provided ample opportunity to ask questions regarding this consent and the Services and have had my questions answered to my satisfaction. . I give my informed consent for the services to be provided through the use of telemedicine in my medical care

## 2019-06-21 ENCOUNTER — Other Ambulatory Visit: Payer: Self-pay

## 2019-06-21 ENCOUNTER — Other Ambulatory Visit: Payer: Medicare Other

## 2019-06-21 DIAGNOSIS — E782 Mixed hyperlipidemia: Secondary | ICD-10-CM

## 2019-06-21 DIAGNOSIS — I2510 Atherosclerotic heart disease of native coronary artery without angina pectoris: Secondary | ICD-10-CM | POA: Diagnosis not present

## 2019-06-21 DIAGNOSIS — I255 Ischemic cardiomyopathy: Secondary | ICD-10-CM | POA: Diagnosis not present

## 2019-06-21 DIAGNOSIS — I10 Essential (primary) hypertension: Secondary | ICD-10-CM

## 2019-06-21 LAB — COMPREHENSIVE METABOLIC PANEL
ALT: 9 IU/L (ref 0–32)
AST: 16 IU/L (ref 0–40)
Albumin/Globulin Ratio: 1.6 (ref 1.2–2.2)
Albumin: 4.5 g/dL (ref 3.7–4.7)
Alkaline Phosphatase: 103 IU/L (ref 48–121)
BUN/Creatinine Ratio: 19 (ref 12–28)
BUN: 17 mg/dL (ref 8–27)
Bilirubin Total: 0.5 mg/dL (ref 0.0–1.2)
CO2: 26 mmol/L (ref 20–29)
Calcium: 9.4 mg/dL (ref 8.7–10.3)
Chloride: 102 mmol/L (ref 96–106)
Creatinine, Ser: 0.9 mg/dL (ref 0.57–1.00)
GFR calc Af Amer: 70 mL/min/{1.73_m2} (ref >59)
GFR calc non Af Amer: 61 mL/min/{1.73_m2} (ref >59)
Globulin, Total: 2.8 g/dL (ref 1.5–4.5)
Glucose: 100 mg/dL — ABNORMAL HIGH (ref 65–99)
Potassium: 4.3 mmol/L (ref 3.5–5.2)
Sodium: 141 mmol/L (ref 134–144)
Total Protein: 7.3 g/dL (ref 6.0–8.5)

## 2019-06-21 LAB — LIPID PANEL
Chol/HDL Ratio: 2.5 ratio (ref 0.0–4.4)
Cholesterol, Total: 117 mg/dL (ref 100–199)
HDL: 46 mg/dL (ref >39)
LDL Chol Calc (NIH): 54 mg/dL (ref 0–99)
Triglycerides: 86 mg/dL (ref 0–149)
VLDL Cholesterol Cal: 17 mg/dL (ref 5–40)

## 2019-07-28 DIAGNOSIS — B351 Tinea unguium: Secondary | ICD-10-CM | POA: Diagnosis not present

## 2019-07-28 DIAGNOSIS — L821 Other seborrheic keratosis: Secondary | ICD-10-CM | POA: Diagnosis not present

## 2019-07-28 DIAGNOSIS — L720 Epidermal cyst: Secondary | ICD-10-CM | POA: Diagnosis not present

## 2019-07-28 DIAGNOSIS — D225 Melanocytic nevi of trunk: Secondary | ICD-10-CM | POA: Diagnosis not present

## 2019-07-28 DIAGNOSIS — Z85828 Personal history of other malignant neoplasm of skin: Secondary | ICD-10-CM | POA: Diagnosis not present

## 2019-08-10 ENCOUNTER — Telehealth: Payer: Medicare Other | Admitting: Physician Assistant

## 2019-08-12 ENCOUNTER — Telehealth: Payer: Self-pay | Admitting: Cardiovascular Disease

## 2019-08-12 NOTE — Telephone Encounter (Signed)
Spoke with the patient who states that her blood pressure has been running low over the past week. Blood pressures from the past two mornings after taking morning medications were 108/65, HR 59 and this morning 105/66, HR 68. Irbesartan was increased to 150 mg in May 2021. She is also taking carvedilol 6.25 mg BID. She reports that she has felt fatigued over the past week. She reports a little bit of dizziness at times but denies any other symptoms. She states that she has lost 10lbs in recent months and now weighs 155lb. Advised patient to make sure she is getting up slowly and staying hydrated. Advised to continue monitoring BP and let us know if systolic is dropping below 100 and that we will call her back with any further recommendations.

## 2019-08-12 NOTE — Telephone Encounter (Signed)
Patient is calling to get advice from Dr. Burt Knack on her BP. She states today it is 105/66, she feels low on energy. She is wanting to know how low should her BP be for her to be worried.

## 2019-08-12 NOTE — Telephone Encounter (Signed)
Spoke with the patient who states that since we spoke she took her BP again and it was 112/71. I advised patient that if needed she could try cutting irbesartan in half. She states that she will continue with the whole tablet for now and continue to monitor BP and let us know if it drops any further.

## 2019-08-12 NOTE — Telephone Encounter (Signed)
Agree with plan. BPs are not dangerously low and HR appears to be okay, but she could try cutting Irbesartan in half to see if that helps

## 2019-08-17 ENCOUNTER — Ambulatory Visit (INDEPENDENT_AMBULATORY_CARE_PROVIDER_SITE_OTHER): Payer: Medicare Other

## 2019-08-17 VITALS — BP 166/88 | HR 55 | Ht 62.0 in | Wt 155.0 lb

## 2019-08-17 DIAGNOSIS — Z0000 Encounter for general adult medical examination without abnormal findings: Secondary | ICD-10-CM

## 2019-08-17 NOTE — Progress Notes (Addendum)
Subjective:   Melissa Gibbs is a 79 y.o. female who presents for Medicare Annual (Subsequent) preventive examination.  The patient consented to a virtual visit.  Review of Systems: Defer to PCP.  Cardiac Risk Factors include: advanced age (>64men, >6 women);hypertension  Objective:   Vitals: BP (!) 166/88 Comment: patient reported- at home  Pulse (!) 55 Comment: patient reported- at home  Ht 5\' 2"  (1.575 m)   Wt 155 lb (70.3 kg)   BMI 28.35 kg/m   Body mass index is 28.35 kg/m.  Advanced Directives 08/17/2019 03/31/2018 12/29/2017 11/22/2017 11/20/2017 10/30/2017 04/28/2017  Does Patient Have a Medical Advance Directive? No No No Yes Yes No No  Type of Advance Directive - - Engineer, production - -  Does patient want to make changes to medical advance directive? - - - No - Patient declined - - -  Copy of [place] in Chart? - - - No - copy requested - - -  Would patient like information on creating a medical advance directive? Yes (MAU/Ambulatory/Procedural Areas - Information given) No - Patient declined No - Patient declined - - No - Patient declined No - Patient declined   Tobacco Social History   Tobacco Use  Smoking Status Former Smoker  . Types: Cigarettes  Smokeless Tobacco Never Used  Tobacco Comment   quit smoking at age 26     Counseling given: No plans to restart   Clinical Intake:  Pre-visit preparation completed: Yes  How often do you need to have someone help you when you read instructions, pamphlets, or other written materials from your doctor or pharmacy?: 1 - Never What is the last grade level you completed in school?: College  Interpreter Needed?: No  Past Medical History:  Diagnosis Date  . ACS (acute coronary syndrome) ([place]) 11/20/2017  . CAD (coronary artery disease) 11/20/2017   a. Dx STEMI on 11/20/2017 s/p staged PCI with DES to pLCx and DES to mid LAD  . Cataract   . Hyperlipidemia    . Hypertension   . Ischemic cardiomyopathy    a. Echo 11/22/2017 w/ LVEF of 35-40%, hypokinesis of basal and mid inferolateral, inferior, and apical inferior and lateral walls, and G1DD  . Myocardial infarction ([place])   . PAD (peripheral artery disease) ([place])    a. Right radial artery noted to be occluded during heart catheterization in 11/2017.  [name] Paroxysmal atrial fibrillation in setting of acute MI    a. Brief episode of atrial fibrillation in setting of acute STEMI - no chronic anticoagulation needed unless recurrent episodes  . Shingles 2000   R flank, mild, self reported, did not present to care   . Skin cancer    Past Surgical History:  Procedure Laterality Date  . ABDOMINAL HYSTERECTOMY     fibroids   . CATARACT EXTRACTION, BILATERAL    . CORONARY STENT INTERVENTION N/A 11/20/2017   Procedure: CORONARY STENT INTERVENTION;  Surgeon: Sherren Mocha, MD;  Location: [facility];  Service: Cardiovascular;  Laterality: N/A;  . CORONARY STENT INTERVENTION N/A 11/23/2017   Procedure: CORONARY STENT INTERVENTION;  Surgeon: Burnell Blanks, MD;  Location: [facility];  Service: Cardiovascular;  Laterality: N/A;  . CORONARY/GRAFT ACUTE MI REVASCULARIZATION N/A 11/20/2017   Procedure: Coronary/Graft Acute MI Revascularization;  Surgeon: Sherren Mocha, MD;  Location: [facility];  Service: Cardiovascular;  Laterality: N/A;  . LEFT HEART CATH AND CORONARY ANGIOGRAPHY N/A  11/20/2017   Procedure: LEFT HEART CATH AND CORONARY ANGIOGRAPHY;  Surgeon: Sherren Mocha, MD;  Location: [facility];  Service: Cardiovascular;  Laterality: N/A;  . SKIN CANCER EXCISION  2014   Sees [facility] regularly   Family History  Problem Relation Age of Onset  . Heart disease Mother 39       MI , rheumatic fever as a child, and heart murmur  . Cancer Father        knee cancer  . Colon cancer Father   . Hypertension Father   . Cancer Sister        melanoma  - died at 48  . Melanoma Sister   . Heart disease Brother   . Melanoma Other        died at age 36  . Cancer Sister   . Lung cancer Sister   . Stroke Neg Hx    Social History   Socioeconomic History  . Marital status: Divorced    Spouse name: Not on file  . Number of children: 1  . Years of education: 77  . Highest education level: Associate degree: academic program  Occupational History  . 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 Occupation: Public affairs consultant: [employer]  Tobacco Use  . Smoking status: Former Smoker    Types: Cigarettes  . Smokeless tobacco: Never Used  . Tobacco comment: quit smoking at age 47  Vaping Use  . Vaping Use: Never used  Substance and Sexual Activity  . Alcohol use: Yes    Comment: very rarely drinks a wine cooler  . Drug use: No  . Sexual activity: Not Currently  Other Topics Concern  . Not on file  Social History Narrative   Patient lives alone in [place]. Patients daughter helps her around her home and comes over often, [name].    Patient enjoys hard work, playing with her animals, crafts, and spending time with her daughter.    Social Determinants of Health   Financial Resource Strain: Low Risk   . Difficulty of Paying Living Expenses: Not very hard  Food Insecurity: No Food Insecurity  . Worried About Charity fundraiser in the Last Year: Never true  . Ran Out of Food in the Last Year: Never true  Transportation Needs: No Transportation Needs  . Lack of Transportation (Medical): No  . Lack of Transportation (Non-Medical): No  Physical Activity: Inactive  . Days of Exercise per Week: 0 days  . Minutes of Exercise per Session: 0 min  Stress: No Stress Concern Present  . Feeling of Stress : Only a little  Social Connections: Moderately Isolated  . Frequency of Communication with Friends and Family: Three times a week  . Frequency of Social Gatherings with Friends and Family:  Twice a week  . Attends Religious Services: Never  . Active Member of Clubs or Organizations: Yes  . Attends Archivist Meetings: Never  . Marital Status: Divorced   Outpatient Encounter Medications as of 08/17/2019  Medication Sig  . atorvastatin (LIPITOR) 80 MG tablet TAKE 1 TABLET BY MOUTH  DAILY AT 6 PM  . carvedilol (COREG) 6.25 MG tablet Take 6.25 mg by mouth 2 (two) times daily with a meal.  . clopidogrel (PLAVIX) 75 MG tablet TAKE 1 TABLET BY MOUTH  DAILY  . irbesartan (AVAPRO) 150 MG tablet Take 1 tablet (150 mg total) by mouth daily.  [name]  levocetirizine (XYZAL) 5 MG tablet Take 1 tablet (5 mg total) by mouth every evening.  . nitroGLYCERIN (NITROSTAT) 0.4 MG SL tablet Place 1 tablet (0.4 mg total) under the tongue every 5 (five) minutes x 3 doses as needed for chest pain.   No facility-administered encounter medications on file as of 08/17/2019.   Activities of Daily Living In your present state of health, do you have any difficulty performing the following activities: 08/17/2019  Hearing? N  Vision? N  Difficulty concentrating or making decisions? N  Walking or climbing stairs? N  Dressing or bathing? N  Doing errands, shopping? N  Comment patient still Restaurant manager, fast food and eating ? N  Using the Toilet? N  In the past six months, have you accidently leaked urine? Y  Do you have problems with loss of bowel control? N  Managing your Medications? N  Managing your Finances? N  Housekeeping or managing your Housekeeping? N  Some recent data might be hidden   Patient Care Team: Eulis Foster, MD as PCP - General (Family Medicine) Sherren Mocha, MD as PCP - Cardiology (Cardiology) Sydnee Levans, MD as Referring Physician (Dermatology)    Assessment:   This is a routine wellness examination for [facility].  Exercise Activities and Dietary recommendations Current Exercise Habits: The patient does not participate in regular exercise at present,  Exercise limited by: None identified  Goals    . Exercise 3x per week (30 min per time)     Encouraged walking 3x per week in the mornings with her daughter.     . Take classes     Patient would like to take art classes at [employer] again    . Track BP at home again     Patient has high blood pressure at the doctor, but reports it is low when she checks it at home. She will change the batteries in her home machine and begin tracking it again.      Fall Risk Fall Risk  08/17/2019 03/31/2018 12/29/2017 12/09/2017 10/30/2017  Falls in the past year? 0 0 0 0 No  Comment - - - - -  Number falls in past yr: - - - - -  Injury with Fall? - - - - -   Is the patient's home free of loose throw rugs in walkways, pet beds, electrical cords, etc?   yes      Grab bars in the bathroom? no      Handrails on the stairs?   no      Adequate lighting?   yes  Patient rating of health (0-10) scale: 8   Depression Screen PHQ 2/9 Scores 08/17/2019 03/31/2018 12/29/2017 12/09/2017  PHQ - 2 Score 0 0 0 0    Cognitive Function MMSE - Mini Mental State Exam 12/29/2017  Orientation to time 5  Orientation to Place 5  Registration 3  Attention/ Calculation 5  Recall 3  Language- name 2 objects 2  Language- repeat 1  Language- follow 3 step command 3  Language- read & follow direction 1  Write a sentence 1  Copy design 1  Total score 30   6CIT Screen 08/17/2019 12/29/2017  What Year? 0 points 0 points  What month? 0 points 0 points  What time? 0 points 0 points  Count back from 20 0 points 0 points  Months in reverse 0 points 0 points  Repeat phrase 0 points 0 points  Total Score 0 0   Immunization History  Administered  Date(s) Administered  . Influenza,inj,Quad PF,6+ Mos 10/30/2017  . Influenza-Unspecified 11/03/2012, 11/04/2014, 11/03/2016  . PFIZER SARS-COV-2 Vaccination 04/03/2019, 05/03/2019  . Pneumococcal Conjugate-13 12/23/2016  . Pneumococcal Polysaccharide-23 09/05/2010  . Td 11/08/2003    Screening Tests Health Maintenance  Topic Date Due  . Hepatitis C Screening  Never done  . DEXA SCAN  Never done  . COLONOSCOPY  08/09/2012  . TETANUS/TDAP  11/07/2013  . INFLUENZA VACCINE  09/04/2019  . COVID-19 Vaccine  Completed  . PNA vac Low Risk Adult  Completed   Cancer Screenings: Lung: Low Dose CT Chest recommended if Age 33-80 years, 30 pack-year currently smoking OR have quit w/in 15years. Patient does not qualify. Breast:  Up to date on Mammogram? Yes   Up to date of Bone Density/Dexa? Yes Colorectal: No interest- has aged out  Plan:  Keep a record of you blood pressures. Take all medications as prescribed. PCP apt 7/23 @830am . Start walking in the am when it is cool. This will help with your blood pressure.  Fill out an advance directive packet with your daughter.   I have personally reviewed and noted the following in the patient's chart:   . Medical and social history . Use of alcohol, tobacco or illicit drugs  . Current medications and supplements . Functional ability and status . Nutritional status . Physical activity . Advanced directives . List of other physicians . Hospitalizations, surgeries, and ER visits in previous 12 months . Vitals . Screenings to include cognitive, depression, and falls . Referrals and appointments  In addition, I have reviewed and discussed with patient certain preventive protocols, quality metrics, and best practice recommendations. A written personalized care plan for preventive services as well as general preventive health recommendations were provided to patient.  This visit was conducted virtually in the setting of the [place] pandemic.    Dorna Bloom, [place]  08/17/2019   I have reviewed this visit and agree with the documentation.   Eulis Foster, MD [place]  PGY-2 [phone]

## 2019-08-17 NOTE — Patient Instructions (Addendum)
You spoke to Melissa Gibbs, [place] over the phone for your annual wellness visit.  We discussed goals: Goals    . Exercise 3x per week (30 min per time)     Encouraged walking 3x per week in the mornings with her daughter.     . 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 Track BP at home again     Patient has high blood pressure at the doctor, but reports it is low when she checks it at home. She will change the batteries in her home machine and begin tracking it again.      We also discussed recommended health maintenance. You have a PCP apt on 7/23. As discussed, you are due for the following. You can discuss health maintenance then.  Health Maintenance  Topic Date Due  . Hepatitis C Screening  Never done  . DEXA SCAN  Never done  . COLONOSCOPY  08/09/2012  . TETANUS/TDAP  11/07/2013  . INFLUENZA VACCINE  09/04/2019  . COVID-19 Vaccine  Completed  . PNA vac Low Risk Adult  Completed   Keep a record of you blood pressures. Take all medications as prescribed. PCP apt 7/23 '@830am'$ . Start walking in the am when it is cool. This will help with your blood pressure.  Fill out an advance directive packet with your daughter.   Preventive Care 80 Years and Older, Female Preventive care refers to lifestyle choices and visits with your health care provider that can promote health and wellness. This includes:  A yearly physical exam. This is also called an annual well check.  Regular dental and eye exams.  Immunizations.  Screening for certain conditions.  Healthy lifestyle choices, such as diet and exercise. What can I expect for my preventive care visit? Physical exam Your health care provider will check:  Height and weight. These may be used to calculate body mass index (BMI), which is a measurement that tells if you are at a healthy weight.  Heart rate and blood pressure.  Your skin for abnormal spots. Counseling Your health care provider  may ask you questions about:  Alcohol, tobacco, and drug use.  Emotional well-being.  Home and relationship well-being.  Sexual activity.  Eating habits.  History of falls.  Memory and ability to understand (cognition).  Work and work Statistician.  Pregnancy and menstrual history. What immunizations do I need?  Influenza (flu) vaccine  This is recommended every year. Tetanus, diphtheria, and pertussis (Tdap) vaccine  You may need a Td booster every 10 years. Varicella (chickenpox) vaccine  You may need this vaccine if you have not already been vaccinated. Zoster (shingles) vaccine  You may need this after age 14. Pneumococcal conjugate (PCV13) vaccine  One dose is recommended after age 75. Pneumococcal polysaccharide (PPSV23) vaccine  One dose is recommended after age 61. Measles, mumps, and rubella (MMR) vaccine  You may need at least one dose of MMR if you were born in 1957 or later. You may also need a second dose. Meningococcal conjugate (MenACWY) vaccine  You may need this if you have certain conditions. Hepatitis A vaccine  You may need this if you have certain conditions or if you travel or work in places where you may be exposed to hepatitis A. Hepatitis B vaccine  You may need this if you have certain conditions or if you travel or work in places where you may be exposed to hepatitis  B. Haemophilus influenzae type b (Hib) vaccine  You may need this if you have certain conditions. You may receive vaccines as individual doses or as more than one vaccine together in one shot (combination vaccines). Talk with your health care provider about the risks and benefits of combination vaccines. What tests do I need? Blood tests  Lipid and cholesterol levels. These may be checked every 5 years, or more frequently depending on your overall health.  Hepatitis C test.  Hepatitis B test. Screening  Lung cancer screening. You may have this screening every year  starting at age 7 if you have a 30-pack-year history of smoking and currently smoke or have quit within the past 15 years.  Colorectal cancer screening. All adults should have this screening starting at age 3 and continuing until age 64. Your health care provider may recommend screening at age 32 if you are at increased risk. You will have tests every 1-10 years, depending on your results and the type of screening test.  Diabetes screening. This is done by checking your blood sugar (glucose) after you have not eaten for a while (fasting). You may have this done every 1-3 years.  Mammogram. This may be done every 1-2 years. Talk with your health care provider about how often you should have regular mammograms.  BRCA-related cancer screening. This may be done if you have a family history of breast, ovarian, tubal, or peritoneal cancers. Other tests  Sexually transmitted disease (STD) testing.  Bone density scan. This is done to screen for osteoporosis. You may have this done starting at age 12. Follow these instructions at home: Eating and drinking  Eat a diet that includes fresh fruits and vegetables, whole grains, lean protein, and low-fat dairy products. Limit your intake of foods with high amounts of sugar, saturated fats, and salt.  Take vitamin and mineral supplements as recommended by your health care provider.  Do not drink alcohol if your health care provider tells you not to drink.  If you drink alcohol: ? Limit how much you have to 0-1 drink a day. ? Be aware of how much alcohol is in your drink. In the [place] one drink equals one 12 oz bottle of beer (355 mL), one 5 oz glass of wine (148 mL), or one 1 oz glass of hard liquor (44 mL). Lifestyle  Take daily care of your teeth and gums.  Stay active. Exercise for at least 30 minutes on 5 or more days each week.  Do not use any products that contain nicotine or tobacco, such as cigarettes, e-cigarettes, and chewing tobacco. If  you need help quitting, ask your health care provider.  If you are sexually active, practice safe sex. Use a condom or other form of protection in order to prevent STIs (sexually transmitted infections).  Talk with your health care provider about taking a low-dose aspirin or statin. What's next?  Go to your health care provider once a year for a well check visit.  Ask your health care provider how often you should have your eyes and teeth checked.  Stay up to date on all vaccines. This information is not intended to replace advice given to you by your health care provider. Make sure you discuss any questions you have with your health care provider. Document Revised: 01/14/2018 Document Reviewed: 01/14/2018 Elsevier Patient Education  2020 [place] clinic's number is [phone]. Please call with questions or concerns about what we discussed today.

## 2019-08-24 ENCOUNTER — Other Ambulatory Visit: Payer: Self-pay | Admitting: Cardiovascular Disease

## 2019-08-26 ENCOUNTER — Encounter: Payer: Self-pay | Admitting: Family Medicine

## 2019-08-26 ENCOUNTER — Other Ambulatory Visit: Payer: Self-pay

## 2019-08-26 ENCOUNTER — Ambulatory Visit (INDEPENDENT_AMBULATORY_CARE_PROVIDER_SITE_OTHER): Payer: Medicare Other | Admitting: Family Medicine

## 2019-08-26 VITALS — BP 170/100 | HR 68 | Ht 62.0 in | Wt 153.0 lb

## 2019-08-26 DIAGNOSIS — E785 Hyperlipidemia, unspecified: Secondary | ICD-10-CM | POA: Diagnosis not present

## 2019-08-26 DIAGNOSIS — I10 Essential (primary) hypertension: Secondary | ICD-10-CM | POA: Diagnosis not present

## 2019-08-26 DIAGNOSIS — R7309 Other abnormal glucose: Secondary | ICD-10-CM | POA: Diagnosis not present

## 2019-08-26 DIAGNOSIS — Z13820 Encounter for screening for osteoporosis: Secondary | ICD-10-CM | POA: Diagnosis not present

## 2019-08-26 DIAGNOSIS — Z0000 Encounter for general adult medical examination without abnormal findings: Secondary | ICD-10-CM

## 2019-08-26 DIAGNOSIS — Z1159 Encounter for screening for other viral diseases: Secondary | ICD-10-CM

## 2019-08-26 LAB — POCT GLYCOSYLATED HEMOGLOBIN (HGB A1C): Hemoglobin A1C: 5.7 % — AB (ref 4.0–5.6)

## 2019-08-26 MED ORDER — IRBESARTAN 150 MG PO TABS
75.0000 mg | ORAL_TABLET | Freq: Every day | ORAL | 3 refills | Status: DC
Start: 2019-09-05 — End: 2020-02-29

## 2019-08-26 NOTE — Patient Instructions (Signed)
It was a pleasure to see you today!  Thank you for choosing [facility] for your primary care.  Melissa Gibbs was seen for physical and blood pressure checkup.   Our plans for today were:  I have a lower drawer blood pressure medication to 75 mg daily.  Please continue to check your blood pressures at home and we will see you back in 2 weeks to follow-up on blood pressure.  We completed some blood work today, I will notify you of any abnormal results  We also checked your hemoglobin A1c to check for diabetes.  I will notify you of any abnormal results and discuss plan going forward.    You should return to our clinic in 2 weeks for blood pressure medication follow-up.   Best Wishes,   Dr. Alba Cory

## 2019-08-26 NOTE — Progress Notes (Signed)
    SUBJECTIVE:   CHIEF COMPLAINT / HPI:   HTN  Blood pressure on recheck was 160/68 Patient states that her cardiology PA increased her irbesartan from 75 mg to 150 mg.  Since this increase in dosage, she states that she noticed she has been having lower pressures measured as low as 59-16 systolic.  She also reports some dizziness and increased fatigue.  She reports that she restarted her 75 mg dose and has noticed that she feels better.  The symptoms started late May into June of this year.  When asked about her blood pressure medication for today, she states she has not taken it today.  Patient reports that she lives alone with her dog.  She denies any recent falls.  Concern for DM  Patient had [place] visit and was told she is borderline for diabetes based on a random blood glucose measurement.Patient states that she mostly eats carbs like muffins and tries to have a more plant-based diet so she does not eat meat.  She also states that she has coffee and sugar-free cereal with fruit for breakfast most times.  She does state that her appetite has decreased as of lately.  For physical activity, she reports that she likes to walk her dogs outside but states that she has not been as active outside lately due to the heat.  Encourage patient to take laps around her home walking during commercial breaks to stay in the cold but still continue with some physical activity.  HLD  Still able to take atorvastatin regularly.  Patient denies any muscle aches or cramps. Last lipid panel LDL was 139, total cholesterol of 117 and HDL 46 with TG of 86 06/2019.   Healthcare Maintenance  Patient is agreeable for hepatitis C screening today as well as ordering DEXA scan.  PERTINENT  PMH / PSH:  HTN  Hx of MI  HLD   OBJECTIVE:   BP (!) 170/100   Pulse 68   Ht 5\' 2"  (1.575 m)   Wt 153 lb (69.4 kg)   SpO2 98%   BMI 27.98 kg/m    General: female appearing stated age in no acute distress HEENT: MMM, no oral  lesions noted,Neck non-tender without lymphadenopathy, masses or thyromegaly Cardio: rhythm is regular.  Bilateral radial pulses palpable Pulm: Clear to auscultation bilaterally, no crackles, wheezing, or diminished breath sounds. Normal respiratory effort Abdomen: Bowel sounds normal. Abdomen soft and non-tender. Extremities: Trace lower extremity edema, left greater than right.  No erythema or calf muscle tenderness with dorsiflexion in right lower extremity, warm/ well perfused.  Varicose veins in bilateral lower extremities on exam  ASSESSMENT/PLAN:   Hypertension Patient experiencing dizziness and hypotensive blood pressures -Decrease irbesartan to 75 mg daily -Patient to return in 2 weeks for blood pressure follow-up  Hyperlipidemia Patient to continue atorvastatin 80 mg daily -Last lipid panel in May 2021 within normal limits  Elevated random blood glucose level Hemoglobin A1c measured today at 5.7. Prediabetes range. Denies increased urinary frequency, polydipsia, frequent HA or symptoms of neuropathy.  - will monitor patient for symptoms associated with DM   Healthcare maintenance DEXA scan ordered today  Hep C screening completed today    Follow up in 2 weeks for BP check on decreased dose of antihypertensive medication   Eulis Foster, MD [place]

## 2019-08-26 NOTE — Assessment & Plan Note (Signed)
Patient to continue atorvastatin 80 mg daily -Last lipid panel in May 2021 within normal limits

## 2019-08-26 NOTE — Assessment & Plan Note (Addendum)
Patient experiencing dizziness and hypotensive blood pressures -Decrease irbesartan to 75 mg daily -Patient to return in 2 weeks for blood pressure follow-up

## 2019-08-27 DIAGNOSIS — R7309 Other abnormal glucose: Secondary | ICD-10-CM | POA: Insufficient documentation

## 2019-08-27 LAB — HEPATITIS C ANTIBODY: Hep C Virus Ab: <0.1 s/co ratio (ref 0.0–0.9)

## 2019-08-27 NOTE — Assessment & Plan Note (Signed)
Hemoglobin A1c measured today at 5.7. Prediabetes range. Denies increased urinary frequency, polydipsia, frequent HA or symptoms of neuropathy.  - will monitor patient for symptoms associated with DM

## 2019-08-27 NOTE — Assessment & Plan Note (Signed)
DEXA scan ordered today  Hep C screening completed today

## 2019-09-05 NOTE — Progress Notes (Signed)
Virtual Visit via Telephone Note   This visit type was conducted due to national recommendations for restrictions regarding the COVID-19 Pandemic (e.g. social distancing) in an effort to limit this patient's exposure and mitigate transmission in our community.  Due to her co-morbid illnesses, this patient is at least at moderate risk for complications without adequate follow up.  This format is felt to be most appropriate for this patient at this time.  The patient did not have access to video technology/had technical difficulties with video requiring transitioning to audio format only (telephone).  All issues noted in this document were discussed and addressed.  No physical exam could be performed with this format.  Please refer to the patient's chart for her  consent to telehealth for [facility].    Date:  09/07/2019   ID:  Melissa Gibbs, DOB 07/30/1940, MRN [id number] The patient was identified using 2 identifiers.  Patient Location: Home Provider Location: Office/Clinic  PCP:  Eulis Foster, MD  Cardiologist:  Sherren Mocha, MD   Electrophysiologist:  None   Evaluation Performed:  Follow-Up Visit  Chief Complaint:  HTN  Patient Profile: Melissa Gibbs is a 79 y.o. female with:  Coronary artery disease ? Inferoposterior STEMI 10/19: DES to the LCx; staged DES to the LAD  Complicated by atrial fibrillation  Ischemic cardiomyopathy ? EF 35-40 ? Echocardiogram 03/2018: EF 60-65  Hypertension  Hyperlipidemia  Peripheral Arterial Disease    Prior CV studies: Echocardiogram 04/01/2018 EF 60-65, mild LVH, grade 1 diastolic dysfunction, mild aortic valve sclerosis, trivial AI  Percutaneous coronary intervention 11/23/2017 PCI: 2.75 x 18 mm Sierra DES to mid LAD  Echocardiogram 11/22/2017 EF 35-40, inferolateral/inferior/apical inferior/lateral HK, grade 1 diastolic dysfunction, mild AI, trivial MR mild LAE, mild TR, mild PI, PASP 33  Cardiac  catheterization 11/20/2017 LAD proximal 80, mid-distal80 LCx ostial 100 RCA proximal 60 EF 45, mid inferior and inferoapical akinesis PCI: 4 x 15 mm Sierra DES to the ostial LCx   History of Present Illness:   Melissa Gibbs was last seen in clinic in 06/2019.  I adjusted her Irbesartan for uncontrolled BP.  She is seen for follow up on blood pressure.  She started to have symptomatic low BP after changing her Irbesartan.  Her pressures was 80s/ at one point.  She reduced her Irbesartan back to 75 mg once daily.   She has felt better since then.  She has a hx of anemia and has noted fatigue.  She will d/w her PCP +/- getting a Hgb drawn.    She has not had chest pain or shortness of breath.  Past Medical History:  Diagnosis Date  . ACS (acute coronary syndrome) ([place]) 11/20/2017  . CAD (coronary artery disease) 11/20/2017   a. Dx STEMI on 11/20/2017 s/p staged PCI with DES to pLCx and DES to mid LAD  . Cataract   . Hyperlipidemia   . Hypertension   . Ischemic cardiomyopathy    a. Echo 11/22/2017 w/ LVEF of 35-40%, hypokinesis of basal and mid inferolateral, inferior, and apical inferior and lateral walls, and G1DD  . Myocardial infarction ([place])   . PAD (peripheral artery disease) ([place])    a. Right radial artery noted to be occluded during heart catheterization in 11/2017.  [name] Paroxysmal atrial fibrillation in setting of acute MI    a. Brief episode of atrial fibrillation in setting of acute STEMI - no chronic anticoagulation needed unless recurrent episodes  . Shingles 2000   R flank,  mild, self reported, did not present to care   . Skin cancer    Past Surgical History:  Procedure Laterality Date  . ABDOMINAL HYSTERECTOMY     fibroids   . CATARACT EXTRACTION, BILATERAL    . CORONARY STENT INTERVENTION N/A 11/20/2017   Procedure: CORONARY STENT INTERVENTION;  Surgeon: Sherren Mocha, MD;  Location: [place] [facility];  Service: Cardiovascular;  Laterality: N/A;  . CORONARY STENT  INTERVENTION N/A 11/23/2017   Procedure: CORONARY STENT INTERVENTION;  Surgeon: Burnell Blanks, MD;  Location: [place] [facility];  Service: Cardiovascular;  Laterality: N/A;  . CORONARY/GRAFT ACUTE MI REVASCULARIZATION N/A 11/20/2017   Procedure: Coronary/Graft Acute MI Revascularization;  Surgeon: Sherren Mocha, MD;  Location: [place] [facility];  Service: Cardiovascular;  Laterality: N/A;  . LEFT HEART CATH AND CORONARY ANGIOGRAPHY N/A 11/20/2017   Procedure: LEFT HEART CATH AND CORONARY ANGIOGRAPHY;  Surgeon: Sherren Mocha, MD;  Location: [place] [facility];  Service: Cardiovascular;  Laterality: N/A;  . SKIN CANCER EXCISION  2014   [facility] regularly     Current Meds  Medication Sig  . atorvastatin (LIPITOR) 80 MG tablet TAKE 1 TABLET BY MOUTH  DAILY AT 6 PM  . carvedilol (COREG) 6.25 MG tablet Take 6.25 mg by mouth 2 (two) times daily with a meal.  . clopidogrel (PLAVIX) 75 MG tablet TAKE 1 TABLET BY MOUTH  DAILY  . irbesartan (AVAPRO) 150 MG tablet Take 0.5 tablets (75 mg total) by mouth daily.  [name] levocetirizine (XYZAL) 5 MG tablet Take 5 mg by mouth as needed for allergies.   . nitroGLYCERIN (NITROSTAT) 0.4 MG SL tablet Place 1 tablet (0.4 mg total) under the tongue every 5 (five) minutes x 3 doses as needed for chest pain.     Allergies:   Amoxicillin   Social History   Tobacco Use  . Smoking status: Former Smoker    Types: Cigarettes  . Smokeless tobacco: Never Used  . Tobacco comment: quit smoking at age 69  Vaping Use  . Vaping Use: Never used  Substance Use Topics  . Alcohol use: Yes    Comment: very rarely drinks a wine cooler  . Drug use: No     Family Hx: The patient's family history includes Cancer in her father, sister, and sister; Colon cancer in her father; Heart disease in her brother; Heart disease (age of onset: 63) in her mother; Hypertension in her father; Lung cancer in her sister; Melanoma in her sister and another  family member. There is no history of Stroke.  ROS:   Please see the history of present illness.    Labs/Other Tests and Data Reviewed:    EKG:  No ECG reviewed.  Recent Labs: 06/21/2019: ALT 9; BUN 17; Creatinine, Ser 0.90; Potassium 4.3; Sodium 141   Recent Lipid Panel Lab Results  Component Value Date/Time   CHOL 117 06/21/2019 07:53 AM   TRIG 86 06/21/2019 07:53 AM   HDL 46 06/21/2019 07:53 AM   CHOLHDL 2.5 06/21/2019 07:53 AM   CHOLHDL 3.9 11/20/2017 10:29 PM   LDLCALC 54 06/21/2019 07:53 AM   LDLDIRECT 139 (H) 08/28/2006 10:17 PM    Wt Readings from Last 3 Encounters:  09/07/19 155 lb (70.3 kg)  08/26/19 153 lb (69.4 kg)  08/17/19 155 lb (70.3 kg)     Objective:    Vital Signs:  BP 106/65   Pulse 63   Ht 5\' 2"  (1.575 m)   Wt 155 lb (70.3  kg)   BMI 28.35 kg/m    VITAL SIGNS:  reviewed RESPIRATORY:  no labored breathing  ASSESSMENT & PLAN:    1. Essential hypertension She had lower readings with the higher dose of the irbesartan.  She was symptomatic with this and had to reduce it back to 75 mg daily.  She feels better now.  We discussed the proper way to monitor her blood pressure.  She has been checking it several times a day at different times.  I advised her to take it sometime midmorning to early afternoon after she has taken her medicines.  I also advised her to take it after 15 to 20 minutes of rest with her feet flat on the floor and her arm at heart level.  I have also asked her to avoid caffeine prior to taking her blood pressure measurement.  If she notices any changes over time, she will contact us.  Otherwise, follow-up in 6 months.    Time:   Today, I have spent 14 minutes with the patient with telehealth technology discussing the above problems.     Medication Adjustments/Labs and Tests Ordered: Current medicines are reviewed at length with the patient today.  Concerns regarding medicines are outlined above.   Tests Ordered: No orders of the  defined types were placed in this encounter.   Medication Changes: No orders of the defined types were placed in this encounter.   Follow Up:  In Person in 6 month(s)  Signed, Richardson Dopp, PA-C  09/07/2019 5:15 PM    [facility]

## 2019-09-07 ENCOUNTER — Telehealth (INDEPENDENT_AMBULATORY_CARE_PROVIDER_SITE_OTHER): Payer: Medicare Other | Admitting: Physician Assistant

## 2019-09-07 ENCOUNTER — Other Ambulatory Visit: Payer: Self-pay

## 2019-09-07 ENCOUNTER — Encounter: Payer: Self-pay | Admitting: Physician Assistant

## 2019-09-07 VITALS — BP 106/65 | HR 63 | Ht 62.0 in | Wt 155.0 lb

## 2019-09-07 DIAGNOSIS — I10 Essential (primary) hypertension: Secondary | ICD-10-CM | POA: Diagnosis not present

## 2019-09-07 NOTE — Patient Instructions (Signed)
Medication Instructions:   Your physician recommends that you continue on your current medications as directed. Please refer to the Current Medication list given to you today.  *If you need a refill on your cardiac medications before your next appointment, please call your pharmacy*  Lab Work:  None ordered today  Testing/Procedures:  None ordered today  Follow-Up: At [facility], you and your health needs are our priority.  As part of our continuing mission to provide you with exceptional heart care, we have created designated Provider Care Teams.  These Care Teams include your primary Cardiologist (physician) and Advanced Practice Providers (APPs -  Physician Assistants and Nurse Practitioners) who all work together to provide you with the care you need, when you need it.  We recommend signing up for the patient portal called "MyChart".  Sign up information is provided on this After Visit Summary.  MyChart is used to connect with patients for Virtual Visits (Telemedicine).  Patients are able to view lab/test results, encounter notes, upcoming appointments, etc.  Non-urgent messages can be sent to your provider as well.   To learn more about what you can do with MyChart, go to [email or web address].    Your next appointment:   6 month(s)  The format for your next appointment:   In Person  Provider:   You may see Michael Cooper, MD or Scott Weaver, PA-C  

## 2019-09-12 ENCOUNTER — Other Ambulatory Visit: Payer: Self-pay

## 2019-09-12 ENCOUNTER — Ambulatory Visit (INDEPENDENT_AMBULATORY_CARE_PROVIDER_SITE_OTHER): Payer: Medicare Other | Admitting: Family Medicine

## 2019-09-12 ENCOUNTER — Encounter: Payer: Self-pay | Admitting: Family Medicine

## 2019-09-12 DIAGNOSIS — R42 Dizziness and giddiness: Secondary | ICD-10-CM

## 2019-09-12 NOTE — Progress Notes (Signed)
    SUBJECTIVE:   CHIEF COMPLAINT / HPI: follow up for BP & dizziness   Patient reports that she is no longer having dizzy spells. Patient reports that she has not been eating as much but has increased consumption of peanut butter and bananas. She reports not having any chest pain, SOB with exertion but denies any sob with resting.  Still taking 75mg  irbesartan daily.   PERTINENT  PMH / PSH:  P Atrial Fibrillation  HTN  PAD  Hx of STEMI (L cx)   OBJECTIVE:   BP (!) 150/86   Pulse 65   Ht 5\' 2"  (1.575 m)   Wt 153 lb 9.6 oz (69.7 kg)   SpO2 95%   BMI 28.09 kg/m    General: elderly female appearing stated age in no acute distress HEENT: MMM, no oral lesions noted Cardio: regular rate and rhythm today. Bilateral radial pulses palpable Pulm: Clear to auscultation bilaterally, no crackles, wheezing, or diminished breath sounds. Normal respiratory effort Abdomen: Bowel sounds normal. Abdomen soft and non-tender. Neuro: pt alert and oriented x4   ASSESSMENT/PLAN:   Dizziness Patient previously seen for dizziness accompanied by hypotension. Reviewed patient's home blood pressure log. - will continue 75mg  irbesartan daily  - continue to monitor BP 2-3x per week     Eulis Foster, MD [place]

## 2019-09-12 NOTE — Patient Instructions (Signed)
It was a pleasure to see you today!  Thank you for choosing [facility] for your primary care.  Melissa Gibbs was seen for BP follow up.   Our plans for today were:  We will continue to take the 75mg  dose of your irbesartan for your blood pressure. I am happy to hear that you dizziness has improved, so to prevent your blood pressure from going too low, we will keep you at this level.   Please make sure to stay hydrated throughout the day and eating three solid meals per day.   You should return to our clinic in 3 months.   Best Wishes,   Dr. Alba Cory

## 2019-09-14 DIAGNOSIS — R42 Dizziness and giddiness: Secondary | ICD-10-CM | POA: Insufficient documentation

## 2019-09-14 NOTE — Assessment & Plan Note (Signed)
Patient previously seen for dizziness accompanied by hypotension. Reviewed patient's home blood pressure log. - will continue 75mg  irbesartan daily  - continue to monitor BP 2-3x per week

## 2019-11-29 ENCOUNTER — Other Ambulatory Visit: Payer: Self-pay | Admitting: Cardiovascular Disease

## 2019-12-09 DIAGNOSIS — H04123 Dry eye syndrome of bilateral lacrimal glands: Secondary | ICD-10-CM | POA: Diagnosis not present

## 2019-12-09 DIAGNOSIS — H40033 Anatomical narrow angle, bilateral: Secondary | ICD-10-CM | POA: Diagnosis not present

## 2019-12-14 ENCOUNTER — Other Ambulatory Visit: Payer: Medicare Other

## 2019-12-15 ENCOUNTER — Other Ambulatory Visit: Payer: Self-pay | Admitting: Family Medicine

## 2019-12-15 ENCOUNTER — Other Ambulatory Visit: Payer: Self-pay

## 2019-12-15 ENCOUNTER — Ambulatory Visit
Admission: RE | Admit: 2019-12-15 | Discharge: 2019-12-15 | Disposition: A | Discharge status: Home / Self Care | Payer: Medicare Other | Source: Ambulatory Visit | Attending: Family Medicine | Admitting: Family Medicine

## 2019-12-15 DIAGNOSIS — Z13820 Encounter for screening for osteoporosis: Secondary | ICD-10-CM

## 2019-12-15 DIAGNOSIS — M85832 Other specified disorders of bone density and structure, left forearm: Secondary | ICD-10-CM | POA: Diagnosis not present

## 2019-12-15 DIAGNOSIS — Z780 Asymptomatic menopausal state: Secondary | ICD-10-CM | POA: Diagnosis not present

## 2019-12-15 DIAGNOSIS — M85852 Other specified disorders of bone density and structure, left thigh: Secondary | ICD-10-CM | POA: Diagnosis not present

## 2019-12-15 MED ORDER — ALENDRONATE SODIUM 10 MG PO TABS: 10.0000 mg | ORAL_TABLET | Freq: Every day | ORAL | 1 refills | Status: DC

## 2019-12-15 MED ORDER — CHOLECALCIFEROL 10 MCG (400 UNIT) PO CAPS: 800.0000 [IU] | ORAL_CAPSULE | Freq: Every day | ORAL | 2 refills | Status: AC

## 2020-01-13 ENCOUNTER — Encounter: Payer: Self-pay | Admitting: Family Medicine

## 2020-02-15 ENCOUNTER — Other Ambulatory Visit: Payer: Self-pay | Admitting: Family Medicine

## 2020-02-20 IMAGING — DX DG CHEST 2V
2 series · 2 of 2 positions shown · non-contrast
Comparison: None.

CLINICAL DATA: Chest pain today.

EXAM:
CHEST - 2 VIEW

[chest pa]
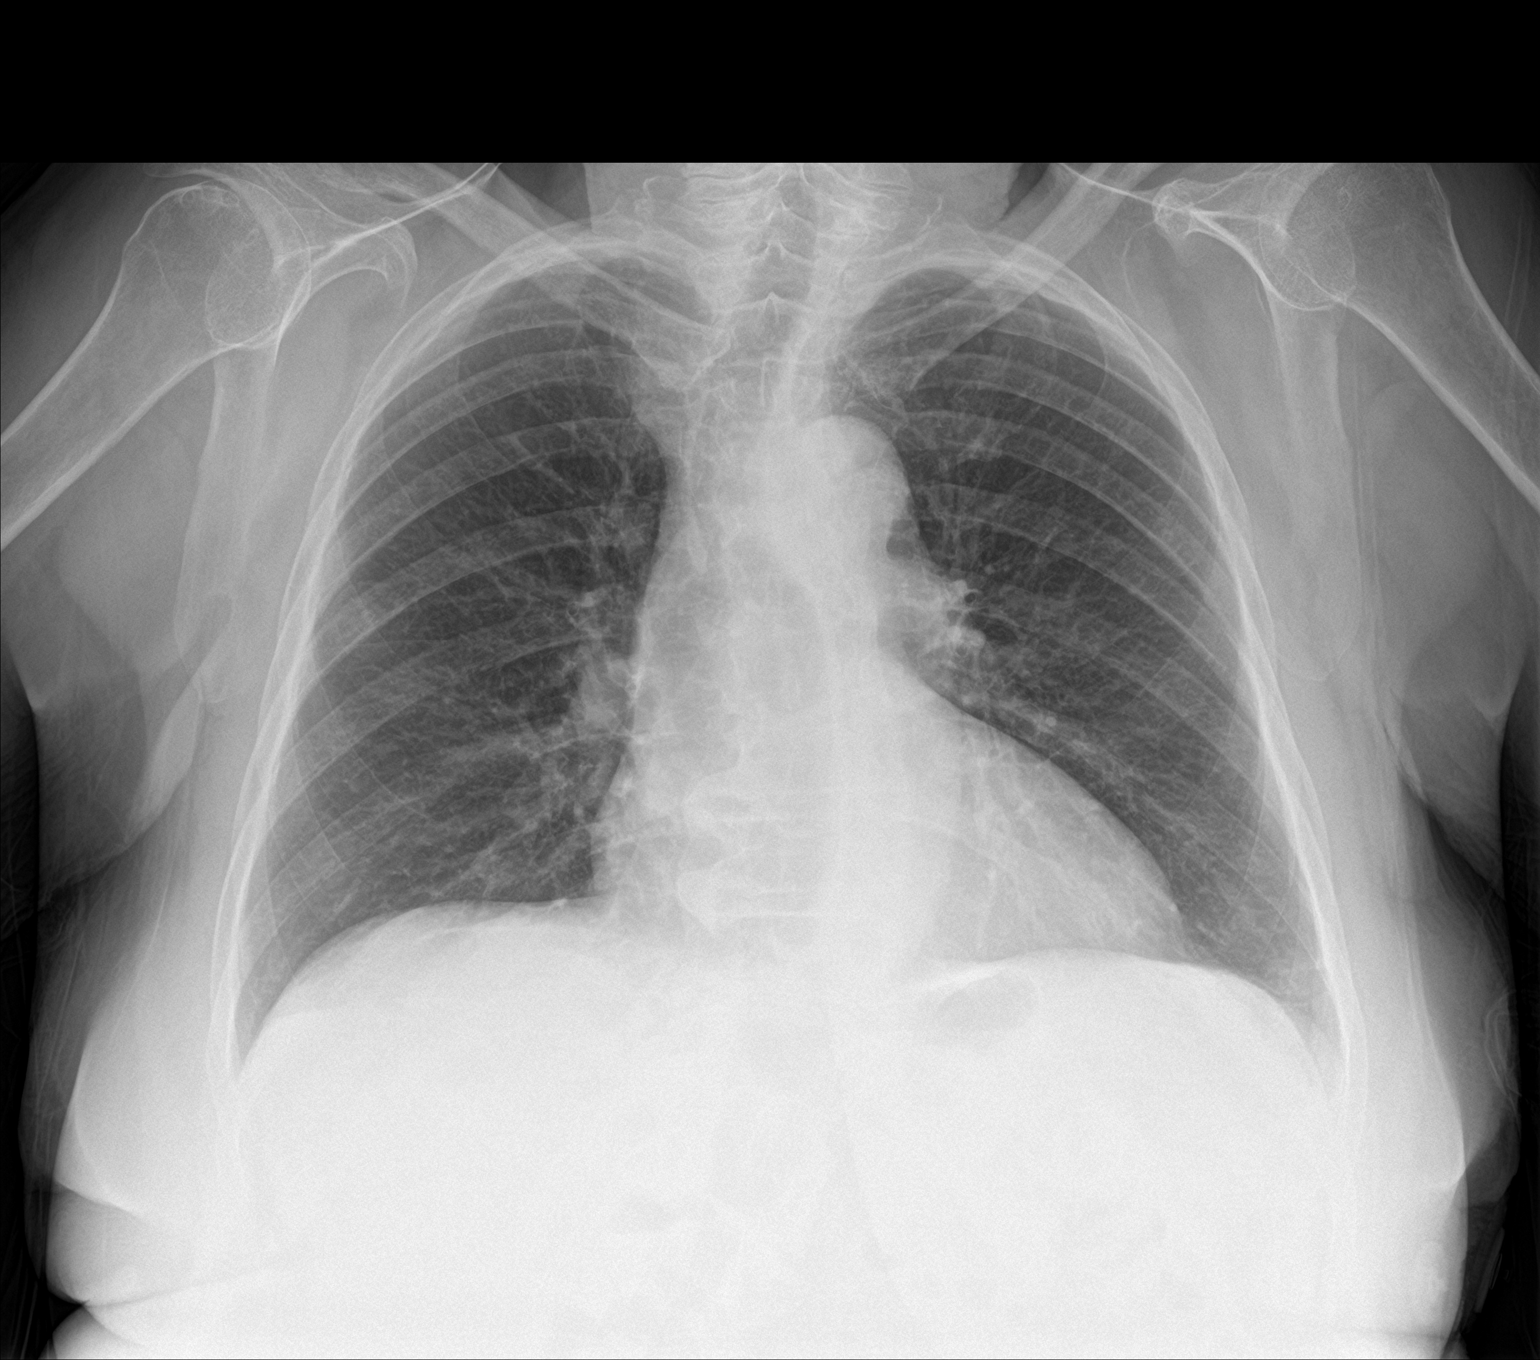

[chest lat]
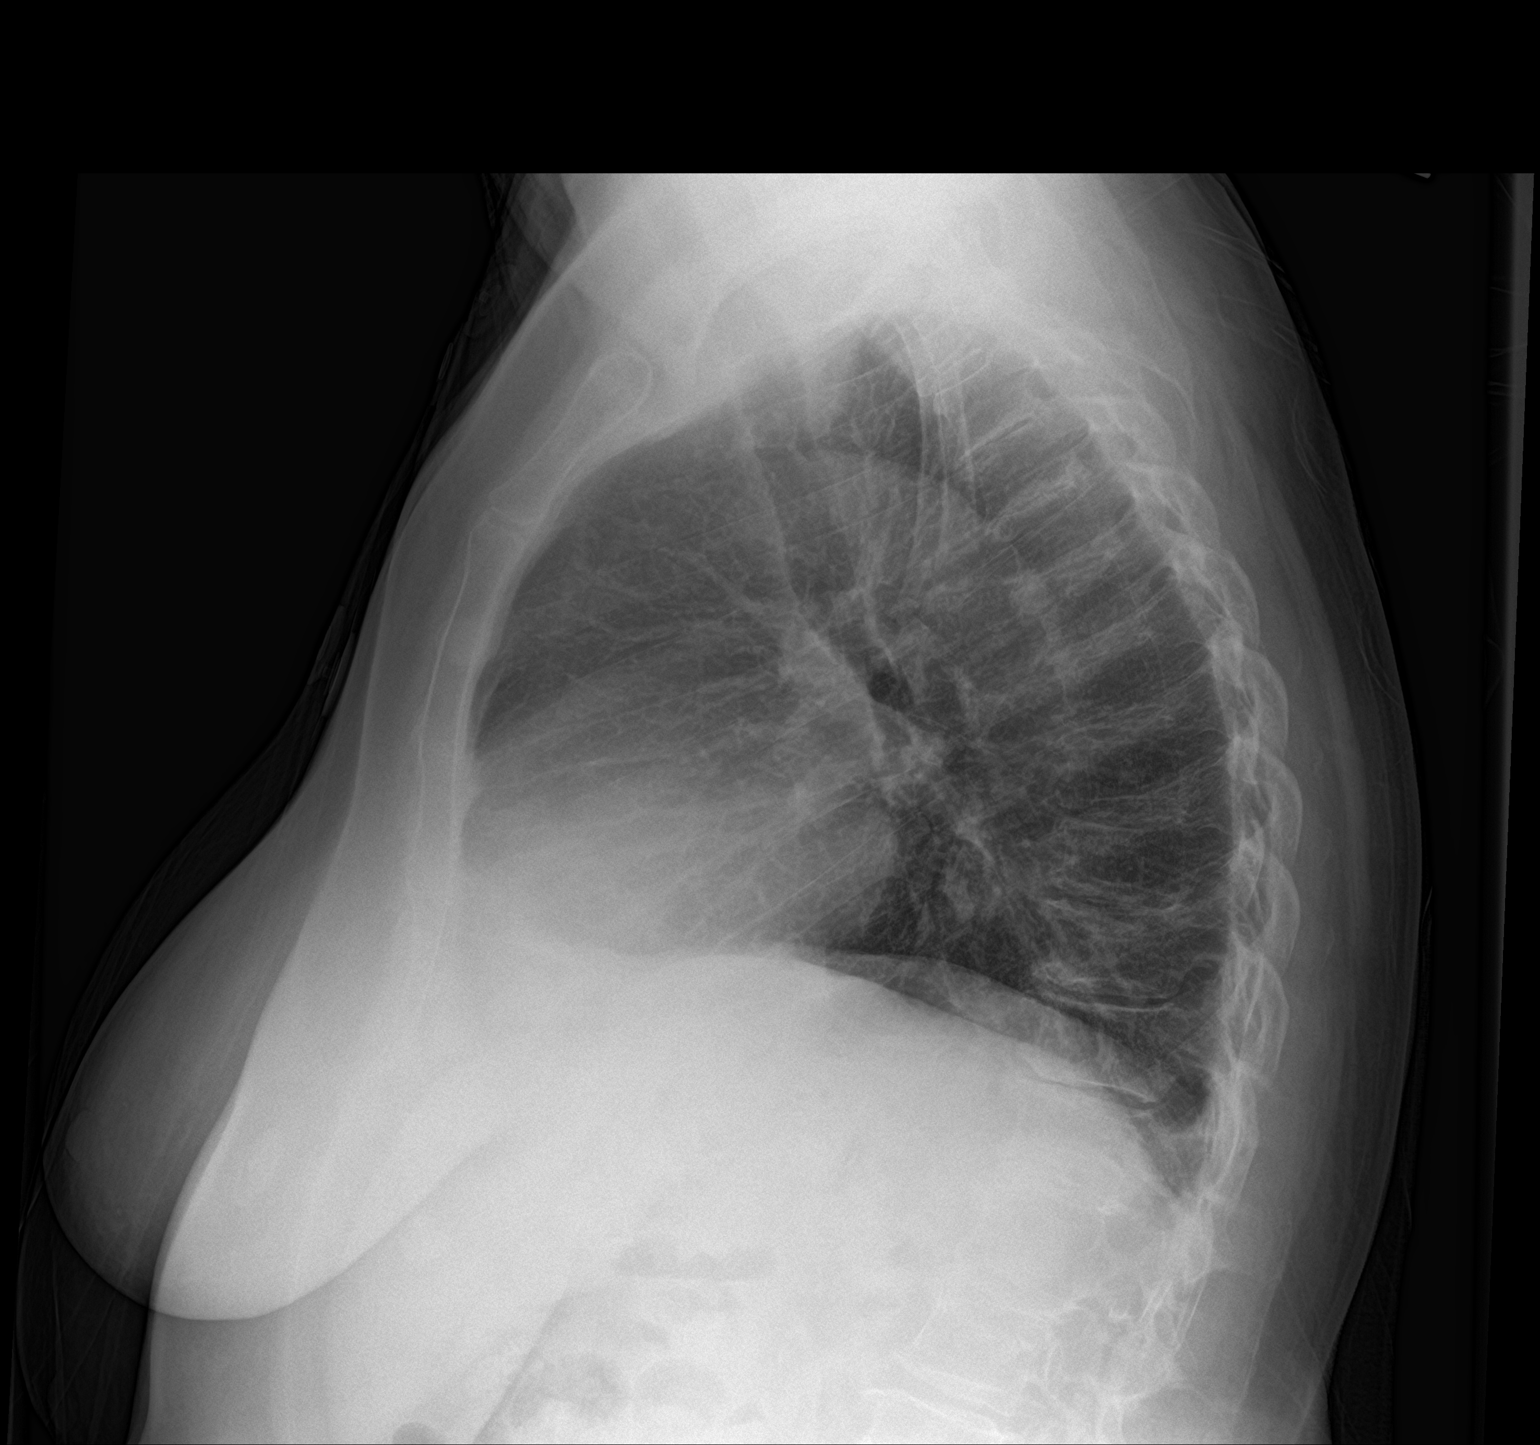

[2 of 2 positions shown; findings below may reference images not displayed]

FINDINGS: Lung volumes are low but the lungs are clear. Heart size is normal.
No pneumothorax or pleural fluid. No acute or focal bony
abnormality. Scattered spondylosis noted.
IMPRESSION: No acute disease.

## 2020-02-29 ENCOUNTER — Other Ambulatory Visit: Payer: Self-pay

## 2020-02-29 MED ORDER — IRBESARTAN 150 MG PO TABS: 75.0000 mg | ORAL_TABLET | Freq: Every day | ORAL | 3 refills | Status: DC

## 2020-03-01 MED ORDER — IRBESARTAN 75 MG PO TABS: 75.0000 mg | ORAL_TABLET | Freq: Every day | ORAL | 1 refills | Status: DC

## 2020-03-01 NOTE — Addendum Note (Signed)
Addended by: Adolph Pollack on: 03/01/2020 10:38 AM   Modules accepted: Orders

## 2020-03-01 NOTE — Telephone Encounter (Signed)
Medication dose updated to 75mg , 90 day supply prescribed.

## 2020-03-01 NOTE — Telephone Encounter (Signed)
Patient calls nurse line regarding irbesartan refill. Patient is requesting that rx be changed to 75 mg tablets so that she does not have to continue cutting pills in half.   Please advise if script can be changed.   Talbot Grumbling, RN

## 2020-03-06 ENCOUNTER — Ambulatory Visit: Payer: Medicare Other | Admitting: Physician Assistant

## 2020-03-15 NOTE — Progress Notes (Unsigned)
Cardiology Office Note:    Date:  03/16/2020   ID:  Vanetta, Rule 05-25-1940, MRN [id number]  PCP:  Eulis Foster, [place]  Cardiologist:  Sherren Mocha, MD  Advanced Practice Provider:  Liliane Shi, PA-C Electrophysiologist:  None       Referring MD: Donnal Moat*   Chief Complaint:  Follow-up (CAD)    Patient Profile:    Melissa Gibbs is a 80 y.o. female with:   Coronary artery disease ? Inferoposterior STEMI 10/19: DES to the LCx; staged DES to the LAD  Complicated by atrial fibrillation  Ischemic cardiomyopathy ? EF 35-40 ? Echocardiogram 03/2018: EF 60-65  Hypertension  Hyperlipidemia  Peripheral Arterial Disease  Prior CV studies: Echocardiogram 04/01/2018 EF 60-65, mild LVH, grade 1 diastolic dysfunction, mild aortic valve sclerosis, trivial AI  Percutaneous coronary intervention 11/23/2017 PCI: 2.75 x 18 mm Sierra DES to mid LAD  Echocardiogram 11/22/2017 EF 35-40, inferolateral/inferior/apical inferior/lateral HK, grade 1 diastolic dysfunction, mild AI, trivial MR mild LAE, mild TR, mild PI, PASP 33  Cardiac catheterization 11/20/2017 LAD proximal 80, mid-distal80 LCx ostial 100 RCA proximal 60 EF 45, mid inferior and inferoapical akinesis PCI: 4 x 15 mm Sierra DES to the ostial LCx   History of Present Illness:    Melissa Gibbs was last seen via telemedicine in 8/21.  She returns for follow-up.  She is here alone.  She has been doing well without chest discomfort, significant shortness of breath, orthopnea, syncope, leg edema.  At last visit, we did adjust her medication for uncontrolled blood pressure.  She could not tolerate this.  It sounds like her blood pressure was running too low.  She has not taken any medications yet today.  Past Medical History:  Diagnosis Date  . ACS (acute coronary syndrome) ([place]) 11/20/2017  . CAD (coronary artery disease) 11/20/2017   a. Dx  STEMI on 11/20/2017 s/p staged PCI with DES to pLCx and DES to mid LAD  . Cataract   . Hyperlipidemia   . Hypertension   . Ischemic cardiomyopathy    a. Echo 11/22/2017 w/ LVEF of 35-40%, hypokinesis of basal and mid inferolateral, inferior, and apical inferior and lateral walls, and G1DD  . Myocardial infarction ([place])   . PAD (peripheral artery disease) ([place])    a. Right radial artery noted to be occluded during heart catheterization in 11/2017.  [name] Paroxysmal atrial fibrillation in setting of acute MI    a. Brief episode of atrial fibrillation in setting of acute STEMI - no chronic anticoagulation needed unless recurrent episodes  . Shingles 2000   R flank, mild, self reported, did not present to care   . Skin cancer     Current Medications: Current Meds  Medication Sig  . alendronate (FOSAMAX) 10 MG tablet TAKE 1 TABLET BY MOUTH  DAILY BEFORE BREAKFAST.  TAKE WITH A FULL GLASS OF  WATER ON AN EMPTY STOMACH  . atorvastatin (LIPITOR) 80 MG tablet TAKE 1 TABLET BY MOUTH  DAILY AT 6 PM  . carvedilol (COREG) 6.25 MG tablet TAKE 1 TABLET BY MOUTH  TWICE DAILY WITH A MEAL  . Cholecalciferol 10 MCG (400 UNIT) CAPS Take 2 capsules (800 Units total) by mouth daily.  . clopidogrel (PLAVIX) 75 MG tablet TAKE 1 TABLET BY MOUTH  DAILY  . irbesartan (AVAPRO) 75 MG tablet Take 1 tablet (75 mg total) by mouth daily.  [name] levocetirizine (XYZAL) 5 MG tablet Take 5 mg by  mouth as needed for allergies.   . nitroGLYCERIN (NITROSTAT) 0.4 MG SL tablet Place 1 tablet (0.4 mg total) under the tongue every 5 (five) minutes x 3 doses as needed for chest pain.     Allergies:   Amoxicillin   Social History   Tobacco Use  . Smoking status: Former Smoker    Types: Cigarettes  . Smokeless tobacco: Never Used  . Tobacco comment: quit smoking at age 89  Vaping Use  . Vaping Use: Never used  Substance Use Topics  . Alcohol use: Yes    Comment: very rarely drinks a wine cooler  . Drug use: No     Family  Hx: The patient's family history includes Cancer in her father, sister, and sister; Colon cancer in her father; Heart disease in her brother; Heart disease (age of onset: 69) in her mother; Hypertension in her father; Lung cancer in her sister; Melanoma in her sister and another family member. There is no history of Stroke.  ROS   EKGs/Labs/Other Test Reviewed:    EKG:  EKG is   ordered today.  The ekg ordered today demonstrates normal sinus rhythm, heart rate 70, left axis deviation, low voltage, poor R wave progression, QTC 414, no change from prior tracing  Recent Labs: 06/21/2019: ALT 9; BUN 17; Creatinine, Ser 0.90; Potassium 4.3; Sodium 141   Recent Lipid Panel Lab Results  Component Value Date/Time   CHOL 117 06/21/2019 07:53 AM   TRIG 86 06/21/2019 07:53 AM   HDL 46 06/21/2019 07:53 AM   CHOLHDL 2.5 06/21/2019 07:53 AM   CHOLHDL 3.9 11/20/2017 10:29 PM   LDLCALC 54 06/21/2019 07:53 AM   LDLDIRECT 139 (H) 08/28/2006 10:17 PM      Risk Assessment/Calculations:      Physical Exam:    VS:  BP 140/64   Pulse 70   Ht 5\' 2"  (1.575 m)   Wt 152 lb 3.2 oz (69 kg)   SpO2 97%   BMI 27.84 kg/m     Wt Readings from Last 3 Encounters:  03/16/20 152 lb 3.2 oz (69 kg)  09/12/19 153 lb 9.6 oz (69.7 kg)  09/07/19 155 lb (70.3 kg)     Constitutional:      Appearance: Healthy appearance. Not in distress.  Neck:     Vascular: No JVR. JVD normal.  Pulmonary:     Effort: Pulmonary effort is normal.     Breath sounds: No wheezing. No rales.  Cardiovascular:     Normal rate. Regular rhythm. Normal S1. Normal S2.     Murmurs: There is no murmur.  Edema:    Peripheral edema absent.  Abdominal:     Palpations: Abdomen is soft.  Skin:    General: Skin is warm and dry.  Neurological:     Mental Status: Alert and oriented to person, place and time.     Cranial Nerves: Cranial nerves are intact.       ASSESSMENT & PLAN:    1. Coronary artery disease involving native  coronary artery of native heart without angina pectoris History of inferior STEMI in 2019 treated with DES to the LCx and staged DES to the LAD.  EF was 35-40 but improved to 60-65.  She is doing well without anginal symptoms.  Continue current medical regimen which includes clopidogrel, atorvastatin, carvedilol and irbesartan.  Follow-up in 6 months.  2. Essential hypertension Blood pressure somewhat elevated today.  She could not tolerate the higher dose of the irbesartan.  She has not taken any medications yet today.  She brings in a list of her blood pressures.  Some of her readings are prior to medication and some are after.  The blood pressure readings after medication are optimal.  Continue current medications.  Continue to monitor blood pressure at home.  3. Mixed hyperlipidemia LDL optimal on most recent lab work.  Continue current Rx.  Arrange fasting CMET, lipid panel prior to next visit in 6 months.    Dispo:  Return in about 6 months (around 09/13/2020) for Routine Follow Up, w/ Dr. Burt Knack, or Richardson Dopp, PA-C, in person.   Medication Adjustments/Labs and Tests Ordered: Current medicines are reviewed at length with the patient today.  Concerns regarding medicines are outlined above.  Tests Ordered: Orders Placed This Encounter  Procedures  . Comprehensive metabolic panel  . Lipid panel  . EKG 12-Lead   Medication Changes: No orders of the defined types were placed in this encounter.   Signed, Richardson Dopp, PA-C  03/16/2020 8:49 AM    [facility] [address], [place], [place]  [address] Phone: [phone]; Fax: [phone]

## 2020-03-16 ENCOUNTER — Ambulatory Visit: Payer: Medicare Other | Admitting: Physician Assistant

## 2020-03-16 ENCOUNTER — Encounter: Payer: Self-pay | Admitting: Physician Assistant

## 2020-03-16 ENCOUNTER — Other Ambulatory Visit: Payer: Self-pay

## 2020-03-16 VITALS — BP 140/64 | HR 70 | Ht 62.0 in | Wt 152.2 lb

## 2020-03-16 DIAGNOSIS — E782 Mixed hyperlipidemia: Secondary | ICD-10-CM | POA: Diagnosis not present

## 2020-03-16 DIAGNOSIS — I2510 Atherosclerotic heart disease of native coronary artery without angina pectoris: Secondary | ICD-10-CM

## 2020-03-16 DIAGNOSIS — I10 Essential (primary) hypertension: Secondary | ICD-10-CM | POA: Diagnosis not present

## 2020-03-16 NOTE — Patient Instructions (Signed)
Medication Instructions:  Your physician recommends that you continue on your current medications as directed. Please refer to the Current Medication list given to you today. *If you need a refill on your cardiac medications before your next appointment, please call your pharmacy*   Lab Work: CMET, Lipids 1 week prior to your appt in 6 mths.  If you have labs (blood work) drawn today and your tests are completely normal, you will receive your results only by: [name] MyChart Message (if you have MyChart) OR . A paper copy in the mail If you have any lab test that is abnormal or we need to change your treatment, we will call you to review the results.   Follow-Up: At [facility], you and your health needs are our priority.  As part of our continuing mission to provide you with exceptional heart care, we have created designated Provider Care Teams.  These Care Teams include your primary Cardiologist (physician) and Advanced Practice Providers (APPs -  Physician Assistants and Nurse Practitioners) who all work together to provide you with the care you need, when you need it.  We recommend signing up for the patient portal called "MyChart".  Sign up information is provided on this After Visit Summary.  MyChart is used to connect with patients for Virtual Visits (Telemedicine).  Patients are able to view lab/test results, encounter notes, upcoming appointments, etc.  Non-urgent messages can be sent to your provider as well.   To learn more about what you can do with MyChart, go to [email or web address].    Your next appointment:   6 month(s)  The format for your next appointment:   In Person  Provider:   You may see Sherren Mocha, MD or one of the following Advanced Practice Providers on your designated Care Team:    Richardson Dopp, [place]

## 2020-04-29 ENCOUNTER — Other Ambulatory Visit: Payer: Self-pay | Admitting: Cardiovascular Disease

## 2020-06-10 NOTE — Progress Notes (Signed)
    SUBJECTIVE:   CHIEF COMPLAINT / HPI: HTN, HLD  HTN  Patient reports that she has been measuring her blood pressure at home.  Majority of her pressures are slightly elevated ranging from 140s to 150s that are measured when she first awakes in the morning and improved to normotensive measurements after taking her blood pressure medications.  She reports that she does have more frequent headaches in the back of her head.  She states that she does not take any medication for these headaches.  She denies blurry vision associated with the headaches.  Patient denies any chest pain or difficulty breathing.  Hyperlipidemia Patient reports she has been taking higher cholesterol medication.  Her diet still consist of mostly snacks and coffee.  She has not been able to exercise much until recently when she had a bothersome neighbor move away so now she feels safe for her to walk outside.  Headaches Patient reports that she has been having headaches multiple times per week.  They are usually located in the back of her head.  She reports that they are "mild" headaches and that she does not require any medication for them.  She denies any Aro.  She denies any nausea associated with headaches.  Patient reports that she is able to sleep off the headaches often.  She states that these are lightheadedness that she used to have in the past.  She denies any blurry vision associated with headaches.   PERTINENT  PMH / PSH:  Hypertension Hyperlipidemia History of MI   OBJECTIVE:   BP (!) 164/86   Pulse 62   Wt 157 lb 3.2 oz (71.3 kg)   SpO2 97%   BMI 28.75 kg/m   General: Elderly female appearing stated age in no acute distress HEENT: MMM, no oral lesions noted,Neck non-tender without lymphadenopathy Cardio: Normal S1 and S2, no S3 or S4. Rhythm is regular. No murmurs or rubs.  Bilateral radial pulses palpable Pulm: Clear to auscultation bilaterally, no crackles, wheezing, or diminished breath sounds.  Normal respiratory effort Abdomen: Bowel sounds normal. Abdomen soft and non-tender.  Extremities: No peripheral edema. Warm & well perfused.  Neuro: pt alert and oriented x4, follows commands, PERRLA, EOMI bilaterally   ASSESSMENT/PLAN:   Hypertension We will continue Coreg and irbesartan daily Blood pressure within normal limits upon recheck, 122/74 Reviewed blood pressure measurements from home and consistent with elevated blood pressures prior to taking her blood pressure medications in the mornings Patient without symptoms of hypotension BMP   Hyperlipidemia Patient will continue Lipitor 80 Lipid panel today  Tension headache Patient describes a several headaches that are mild in nature No Aura nausea or throbbing character recently that would be consistent with migraine, headaches not associated with lacrimation and not occurring episodically throughout the day so less likely cluster headache Recommended that patient take Tylenol when headaches occur in counseled her on noticing any new changes to the characteristic of the headache such as blurry vision, confusion, worsening headache or vomiting     Eulis Foster, MD [place]

## 2020-06-11 ENCOUNTER — Ambulatory Visit (INDEPENDENT_AMBULATORY_CARE_PROVIDER_SITE_OTHER): Payer: Medicare Other | Admitting: Family Medicine

## 2020-06-11 ENCOUNTER — Other Ambulatory Visit: Payer: Self-pay

## 2020-06-11 ENCOUNTER — Encounter: Payer: Self-pay | Admitting: Family Medicine

## 2020-06-11 VITALS — BP 164/86 | HR 62 | Wt 157.2 lb

## 2020-06-11 DIAGNOSIS — I10 Essential (primary) hypertension: Secondary | ICD-10-CM

## 2020-06-11 DIAGNOSIS — G44209 Tension-type headache, unspecified, not intractable: Secondary | ICD-10-CM | POA: Diagnosis not present

## 2020-06-11 DIAGNOSIS — E785 Hyperlipidemia, unspecified: Secondary | ICD-10-CM | POA: Diagnosis not present

## 2020-06-11 NOTE — Patient Instructions (Addendum)
It was a pleasure to see you today!  Thank you for choosing [facility] for your primary care.   Melissa Gibbs was seen for blood pressure follow up and cholesterol.   Our plans for today were:  For your blood pressure, please continue to take your irbesartan and Coreg daily to manage your readings and let us know if you begin to have dizziness or feel as if you may faint.   We will check your electrolyte levels today as well as your cholesterol. I will let you know if you have any abnormal readings.   For your headaches, I recommend taking Tylenol and decreasing your caffeine intake to see if this decreases the amount of headaches you experience.   To keep you healthy, please keep in mind the following health maintenance items that you are due for:   1. Tetanus Vaccine  2.  COVID Vaccination    You should return to our clinic in 4 weeks  for blood pressure follow up.   Best Wishes,   Dr. Alba Cory     Tension Headache, Adult A tension headache is a feeling of pain, pressure, or aching in the head. It is often felt over the front and sides of the head. Tension headaches can last from 30 minutes to several days. What are the causes? The cause of this condition is not known. Sometimes, tension headaches are brought on by stress, worry (anxiety), or depression. Other things that may set them off include:  Alcohol.  Too much caffeine or caffeine withdrawal.  Colds, flu, or sinus infections.  Dental problems. This can include clenching your teeth.  Being tired.  Holding your head and neck in the same position for a long time, such as while using a computer.  Smoking.  Arthritis in the neck. What are the signs or symptoms?  Feeling pressure around the head.  A dull ache in the head.  Pain over the front and sides of the head.  Feeling sore or tender in the muscles of the head, neck, and shoulders. How is this treated? This condition may be treated  with lifestyle changes and with medicines that help relieve symptoms. Follow these instructions at home: Managing pain  Take over-the-counter and prescription medicines only as told by your doctor.  When you have a headache, lie down in a dark, quiet room.  If told, put ice on your head and neck. To do this: ? Put ice in a plastic bag. ? Place a towel between your skin and the bag. ? Leave the ice on for 20 minutes, 2-3 times a day. ? Take off the ice if your skin turns bright red. This is very important. If you cannot feel pain, heat, or cold, you have a greater risk of damage to the area.  If told, put heat on the back of your neck. Do this as often as told by your doctor. Use the heat source that your doctor recommends, such as a moist heat pack or a heating pad. ? Place a towel between your skin and the heat source. ? Leave the heat on for 20-30 minutes. ? Take off the heat if your skin turns bright red. This is very important. If you cannot feel pain, heat, or cold, you have a greater risk of getting burned. Eating and drinking  Eat meals on a regular schedule.  If you drink alcohol: ? Limit how much you have to:  0-1 drink a day for women who are  not pregnant.  0-2 drinks a day for men. ? Know how much alcohol is in your drink. In the [place] one drink equals one 12 oz bottle of beer (355 mL), one 5 oz glass of wine (148 mL), or one 1 oz glass of hard liquor (44 mL).  Drink enough fluid to keep your pee (urine) pale yellow.  Do not use a lot of caffeine, or stop using caffeine. Lifestyle  Get 7-9 hours of sleep each night. Or get the amount of sleep that your doctor tells you to.  At bedtime, keep computers, phones, and tablets out of your room.  Find ways to lessen your stress. This may include: ? Exercise. ? Deep breathing. ? Yoga. ? Listening to music. ? Thinking positive thoughts.  Sit up straight. Try to relax your muscles.  Do not smoke or use any products  that contain nicotine or tobacco. If you need help quitting, ask your doctor. General instructions  Avoid things that can bring on headaches. Keep a headache journal to see what may bring on headaches. For example, write down: ? What you eat and drink. ? How much sleep you get. ? Any change to your diet or medicines.  Keep all follow-up visits.   Contact a doctor if:  Your headache does not get better.  Your headache comes back.  You have a headache, and sounds, light, or smells bother you.  You feel like you may vomit, or you vomit.  Your stomach hurts. Get help right away if:  You all of a sudden get a very bad headache with any of these things: ? A stiff neck. ? Feeling like you may vomit. ? Vomiting. ? Feeling mixed up (confused). ? Feeling weak in one part or one side of your body. ? Having trouble seeing or speaking, or both. ? Feeling short of breath. ? A rash. ? Feeling very sleepy. ? Pain in your eye or ear. ? Trouble walking or balancing. ? Feeling like you will faint, or you faint. Summary  A tension headache is pain, pressure, or aching in your head.  Tension headaches can last from 30 minutes to several days.  Lifestyle changes and medicines may help relieve pain. This information is not intended to replace advice given to you by your health care provider. Make sure you discuss any questions you have with your health care provider. Document Revised: 10/20/2019 Document Reviewed: 10/20/2019 Elsevier Patient Education  2021 [employer].

## 2020-06-12 LAB — LIPID PANEL
Chol/HDL Ratio: 2.6 ratio (ref 0.0–4.4)
Cholesterol, Total: 118 mg/dL (ref 100–199)
HDL: 46 mg/dL (ref >39)
LDL Chol Calc (NIH): 54 mg/dL (ref 0–99)
Triglycerides: 94 mg/dL (ref 0–149)
VLDL Cholesterol Cal: 18 mg/dL (ref 5–40)

## 2020-06-12 LAB — BASIC METABOLIC PANEL
BUN/Creatinine Ratio: 25 (ref 12–28)
BUN: 20 mg/dL (ref 8–27)
CO2: 25 mmol/L (ref 20–29)
Calcium: 9.1 mg/dL (ref 8.7–10.3)
Chloride: 102 mmol/L (ref 96–106)
Creatinine, Ser: 0.81 mg/dL (ref 0.57–1.00)
Glucose: 113 mg/dL — ABNORMAL HIGH (ref 65–99)
Potassium: 3.9 mmol/L (ref 3.5–5.2)
Sodium: 141 mmol/L (ref 134–144)
eGFR: 73 mL/min/{1.73_m2} (ref >59)

## 2020-06-13 DIAGNOSIS — G44209 Tension-type headache, unspecified, not intractable: Secondary | ICD-10-CM | POA: Insufficient documentation

## 2020-06-13 NOTE — Assessment & Plan Note (Signed)
Patient will continue Lipitor 80 Lipid panel today

## 2020-06-13 NOTE — Assessment & Plan Note (Signed)
Patient describes a several headaches that are mild in nature No Aura nausea or throbbing character recently that would be consistent with migraine, headaches not associated with lacrimation and not occurring episodically throughout the day so less likely cluster headache Recommended that patient take Tylenol when headaches occur in counseled her on noticing any new changes to the characteristic of the headache such as blurry vision, confusion, worsening headache or vomiting

## 2020-06-13 NOTE — Assessment & Plan Note (Signed)
We will continue Coreg and irbesartan daily Blood pressure within normal limits upon recheck, 122/74 Reviewed blood pressure measurements from home and consistent with elevated blood pressures prior to taking her blood pressure medications in the mornings Patient without symptoms of hypotension BMP

## 2020-06-26 ENCOUNTER — Other Ambulatory Visit: Payer: Self-pay | Admitting: Cardiovascular Disease

## 2020-07-08 ENCOUNTER — Encounter (HOSPITAL_COMMUNITY): Payer: Self-pay | Admitting: Emergency Medicine

## 2020-07-08 ENCOUNTER — Other Ambulatory Visit: Payer: Self-pay

## 2020-07-08 ENCOUNTER — Emergency Department (HOSPITAL_COMMUNITY)
Admission: EM | Admit: 2020-07-08 | Discharge: 2020-07-09 | Disposition: A | Discharge status: Home / Self Care | Payer: Medicare Other | Attending: Emergency Medicine | Admitting: Emergency Medicine

## 2020-07-08 DIAGNOSIS — Z85828 Personal history of other malignant neoplasm of skin: Secondary | ICD-10-CM | POA: Insufficient documentation

## 2020-07-08 DIAGNOSIS — M545 Low back pain, unspecified: Secondary | ICD-10-CM | POA: Diagnosis present

## 2020-07-08 DIAGNOSIS — B9689 Other specified bacterial agents as the cause of diseases classified elsewhere: Secondary | ICD-10-CM | POA: Diagnosis not present

## 2020-07-08 DIAGNOSIS — I25110 Atherosclerotic heart disease of native coronary artery with unstable angina pectoris: Secondary | ICD-10-CM | POA: Insufficient documentation

## 2020-07-08 DIAGNOSIS — I10 Essential (primary) hypertension: Secondary | ICD-10-CM | POA: Diagnosis not present

## 2020-07-08 DIAGNOSIS — N390 Urinary tract infection, site not specified: Secondary | ICD-10-CM | POA: Diagnosis not present

## 2020-07-08 DIAGNOSIS — D7389 Other diseases of spleen: Secondary | ICD-10-CM | POA: Diagnosis not present

## 2020-07-08 DIAGNOSIS — K8020 Calculus of gallbladder without cholecystitis without obstruction: Secondary | ICD-10-CM | POA: Diagnosis not present

## 2020-07-08 DIAGNOSIS — Z87891 Personal history of nicotine dependence: Secondary | ICD-10-CM | POA: Insufficient documentation

## 2020-07-08 DIAGNOSIS — R109 Unspecified abdominal pain: Secondary | ICD-10-CM | POA: Diagnosis not present

## 2020-07-08 DIAGNOSIS — Z79899 Other long term (current) drug therapy: Secondary | ICD-10-CM | POA: Diagnosis not present

## 2020-07-08 DIAGNOSIS — K449 Diaphragmatic hernia without obstruction or gangrene: Secondary | ICD-10-CM | POA: Diagnosis not present

## 2020-07-08 DIAGNOSIS — Z7902 Long term (current) use of antithrombotics/antiplatelets: Secondary | ICD-10-CM | POA: Insufficient documentation

## 2020-07-08 DIAGNOSIS — N135 Crossing vessel and stricture of ureter without hydronephrosis: Secondary | ICD-10-CM | POA: Diagnosis not present

## 2020-07-08 NOTE — ED Provider Notes (Signed)
Emergency Medicine Provider Triage Evaluation Note  Melissa Gibbs , a 80 y.o. female  was evaluated in triage.  Pt complains of right sided lateral low back pain.  She reports that earlier today she was feeling poorly so she checked her temperature and it was elevated above 100.4.  She has not taken any antipyretics since then.  She denies any injury or new events.  She reports compliance with all of her medicines.  She does not have a history of similar.  No heavy lifting..  Review of Systems  Positive: Right sided abdominal /back pain Negative: Shortness of breath  Physical Exam  BP (!) 163/80 (BP Location: Left Arm)   Pulse 77   Temp 98.8 F (37.1 C)   Resp 16   SpO2 98%  Gen:   Awake, no distress   Resp:  Normal effort  MSK:   Moves extremities without difficulty, TTP over right lateral lumbar back.  Other:  No CVA TTP over bilateral CVA.  No skin discoloration or wound visualized in area of pain.  Palpation over right lateral low back worsens the pain.   Medical Decision Making  Medically screening exam initiated at 10:43 PM.  Appropriate orders placed.  Melissa Gibbs was informed that the remainder of the evaluation will be completed by another provider, this initial triage assessment does not replace that evaluation, and the importance of remaining in the ED until their evaluation is complete.     Melissa Gibbs 07/08/20 2246    Melissa Reichert, MD 07/08/20 2351

## 2020-07-08 NOTE — ED Triage Notes (Signed)
Pt reports right sided lower back pain.  Pt reports a fever of 101.  Hurts more w/ movement

## 2020-07-09 ENCOUNTER — Emergency Department (HOSPITAL_COMMUNITY): Payer: Medicare Other

## 2020-07-09 DIAGNOSIS — K449 Diaphragmatic hernia without obstruction or gangrene: Secondary | ICD-10-CM | POA: Diagnosis not present

## 2020-07-09 DIAGNOSIS — K8020 Calculus of gallbladder without cholecystitis without obstruction: Secondary | ICD-10-CM | POA: Diagnosis not present

## 2020-07-09 DIAGNOSIS — D7389 Other diseases of spleen: Secondary | ICD-10-CM | POA: Diagnosis not present

## 2020-07-09 DIAGNOSIS — N135 Crossing vessel and stricture of ureter without hydronephrosis: Secondary | ICD-10-CM | POA: Diagnosis not present

## 2020-07-09 DIAGNOSIS — N390 Urinary tract infection, site not specified: Secondary | ICD-10-CM | POA: Diagnosis not present

## 2020-07-09 LAB — CBC WITH DIFFERENTIAL/PLATELET
Abs Immature Granulocytes: 0.03 10*3/uL (ref 0.00–0.07)
Basophils Absolute: 0.1 10*3/uL (ref 0.0–0.1)
Basophils Relative: 0 %
Eosinophils Absolute: 0 10*3/uL (ref 0.0–0.5)
Eosinophils Relative: 0 %
HCT: 39.5 % (ref 36.0–46.0)
Hemoglobin: 12.4 g/dL (ref 12.0–15.0)
Immature Granulocytes: 0 %
Lymphocytes Relative: 13 %
Lymphs Abs: 1.7 10*3/uL (ref 0.7–4.0)
MCH: 29.5 pg (ref 26.0–34.0)
MCHC: 31.4 g/dL (ref 30.0–36.0)
MCV: 94 fL (ref 80.0–100.0)
Monocytes Absolute: 0.8 10*3/uL (ref 0.1–1.0)
Monocytes Relative: 6 %
Neutro Abs: 10.7 10*3/uL — ABNORMAL HIGH (ref 1.7–7.7)
Neutrophils Relative %: 81 %
Platelets: 272 10*3/uL (ref 150–400)
RBC: 4.2 MIL/uL (ref 3.87–5.11)
RDW: 13.2 % (ref 11.5–15.5)
WBC: 13.3 10*3/uL — ABNORMAL HIGH (ref 4.0–10.5)
nRBC: 0 % (ref 0.0–0.2)

## 2020-07-09 LAB — URINALYSIS, ROUTINE W REFLEX MICROSCOPIC
Bilirubin Urine: NEGATIVE
Glucose, UA: NEGATIVE mg/dL
Ketones, ur: NEGATIVE mg/dL
Nitrite: POSITIVE — AB
Protein, ur: NEGATIVE mg/dL
Specific Gravity, Urine: 1.004 — ABNORMAL LOW (ref 1.005–1.030)
WBC, UA: >50 WBC/hpf — ABNORMAL HIGH (ref 0–5)
pH: 6 (ref 5.0–8.0)

## 2020-07-09 LAB — COMPREHENSIVE METABOLIC PANEL
ALT: 10 U/L (ref 0–44)
AST: 18 U/L (ref 15–41)
Albumin: 4.1 g/dL (ref 3.5–5.0)
Alkaline Phosphatase: 60 U/L (ref 38–126)
Anion gap: 10 (ref 5–15)
BUN: 14 mg/dL (ref 8–23)
CO2: 26 mmol/L (ref 22–32)
Calcium: 9.2 mg/dL (ref 8.9–10.3)
Chloride: 99 mmol/L (ref 98–111)
Creatinine, Ser: 0.92 mg/dL (ref 0.44–1.00)
GFR, Estimated: >60 mL/min (ref >60)
Glucose, Bld: 114 mg/dL — ABNORMAL HIGH (ref 70–99)
Potassium: 4.2 mmol/L (ref 3.5–5.1)
Sodium: 135 mmol/L (ref 135–145)
Total Bilirubin: 1.1 mg/dL (ref 0.3–1.2)
Total Protein: 7.5 g/dL (ref 6.5–8.1)

## 2020-07-09 LAB — LIPASE, BLOOD: Lipase: 34 U/L (ref 11–51)

## 2020-07-09 MED ORDER — LIDOCAINE HCL (PF) 1 % IJ SOLN
INTRAMUSCULAR | Status: AC
  Administered 2020-07-09: 2.1 mL
  Filled 2020-07-09: qty 5

## 2020-07-09 MED ORDER — CEFDINIR 300 MG PO CAPS: 300.0000 mg | ORAL_CAPSULE | Freq: Two times a day (BID) | ORAL | 0 refills | Status: DC

## 2020-07-09 MED ORDER — CEFTRIAXONE SODIUM 1 G IJ SOLR
1.0000 g | Freq: Once | INTRAMUSCULAR | Status: AC
  Administered 2020-07-09: 1 g via INTRAMUSCULAR
  Filled 2020-07-09: qty 10

## 2020-07-09 NOTE — ED Notes (Signed)
Patient transported to CT scan . 

## 2020-07-09 NOTE — ED Provider Notes (Signed)
[facility] EMERGENCY DEPARTMENT Provider Note   CSN: [id number] Arrival date & time: 07/08/20  2125     History Chief Complaint  Patient presents with  . Back Pain    Melissa Gibbs is a 80 y.o. female.  Patient presents to the emergency department for evaluation of back pain.  Patient reports that she has been having right lower back pain all day.  Pain is sharp and worsens with any movement.  She denies injury.  It does not radiate to the extremities.  She has not noticed any urinary symptoms.  She does report that this afternoon she felt warm and took her temperature, it was 101.  She did not take any Tylenol or Motrin.        Past Medical History:  Diagnosis Date  . ACS (acute coronary syndrome) ([place]) 11/20/2017  . CAD (coronary artery disease) 11/20/2017   a. Dx STEMI on 11/20/2017 s/p staged PCI with DES to pLCx and DES to mid LAD  . Cataract   . Hyperlipidemia   . Hypertension   . Ischemic cardiomyopathy    a. Echo 11/22/2017 w/ LVEF of 35-40%, hypokinesis of basal and mid inferolateral, inferior, and apical inferior and lateral walls, and G1DD  . Myocardial infarction ([place])   . PAD (peripheral artery disease) ([place])    a. Right radial artery noted to be occluded during heart catheterization in 11/2017.  [name] Paroxysmal atrial fibrillation in setting of acute MI    a. Brief episode of atrial fibrillation in setting of acute STEMI - no chronic anticoagulation needed unless recurrent episodes  . Shingles 2000   R flank, mild, self reported, did not present to care   . Skin cancer     Patient Active Problem List   Diagnosis Date Noted  . Tension headache 06/13/2020  . Dizziness 09/14/2019  . Elevated random blood glucose level 08/27/2019  . Poison ivy dermatitis 05/13/2019  . Eustachian tube disorder, bilateral 03/31/2018  . Ischemic cardiomyopathy 11/24/2017  . Paroxysmal atrial fibrillation ([place]) 11/24/2017  . PAD (peripheral artery disease)  ([place]) 11/24/2017  . Coronary artery disease involving native coronary artery of native heart with unstable angina pectoris ([place])   . STEMI involving left circumflex coronary artery ([place])   . Seasonal allergies 04/28/2017  . Healthcare maintenance 11/07/2015  . SK (seborrheic keratosis) 05/20/2013  . 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 Overweight (BMI 25.0-29.9) 05/20/2013  . H/O nonmelanoma skin cancer 05/20/2013  . Hyperlipidemia 04/02/2006  . Hypertension 04/02/2006    Past Surgical History:  Procedure Laterality Date  . ABDOMINAL HYSTERECTOMY     fibroids   . CATARACT EXTRACTION, BILATERAL    . CORONARY STENT INTERVENTION N/A 11/20/2017   Procedure: CORONARY STENT INTERVENTION;  Surgeon: Sherren Mocha, MD;  Location: [facility];  Service: Cardiovascular;  Laterality: N/A;  . CORONARY STENT INTERVENTION N/A 11/23/2017   Procedure: CORONARY STENT INTERVENTION;  Surgeon: Burnell Blanks, MD;  Location: [facility];  Service: Cardiovascular;  Laterality: N/A;  . CORONARY/GRAFT ACUTE MI REVASCULARIZATION N/A 11/20/2017   Procedure: Coronary/Graft Acute MI Revascularization;  Surgeon: Sherren Mocha, MD;  Location: [facility];  Service: Cardiovascular;  Laterality: N/A;  . LEFT HEART CATH AND CORONARY ANGIOGRAPHY N/A 11/20/2017   Procedure: LEFT HEART CATH AND CORONARY ANGIOGRAPHY;  Surgeon: Sherren Mocha, MD;  Location: [facility];  Service: Cardiovascular;  Laterality: N/A;  . SKIN CANCER EXCISION  2014   [facility] Dermatology regularly  OB History   No obstetric history on file.     Family History  Problem Relation Age of Onset  . Heart disease Mother 68       MI , rheumatic fever as a child, and heart murmur  . Cancer Father        knee cancer  . Colon cancer Father   . Hypertension Father   . Cancer Sister        melanoma - died at 68  . Melanoma Sister   . Heart disease Brother   . Melanoma Other        died at age  29  . Cancer Sister   . Lung cancer Sister   . Stroke Neg Hx     Social History   Tobacco Use  . Smoking status: Former Smoker    Types: Cigarettes  . Smokeless tobacco: Never Used  . Tobacco comment: quit smoking at age 31  Vaping Use  . Vaping Use: Never used  Substance Use Topics  . Alcohol use: Yes    Comment: very rarely drinks a wine cooler  . Drug use: No    Home Medications Prior to Admission medications   Medication Sig Start Date End Date Taking? Authorizing Provider  cefdinir (OMNICEF) 300 MG capsule Take 1 capsule (300 mg total) by mouth 2 (two) times daily. 07/09/20  Yes Anaiah Mcmannis, Gwenyth Allegra, MD  alendronate (FOSAMAX) 10 MG tablet TAKE 1 TABLET BY MOUTH  DAILY BEFORE BREAKFAST.  TAKE WITH A FULL GLASS OF  WATER ON AN EMPTY STOMACH 02/15/20   Simmons-Robinson, Riki Sheer, MD  atorvastatin (LIPITOR) 80 MG tablet TAKE 1 TABLET BY MOUTH  DAILY AT 6 PM 06/26/20   Sherren Mocha, MD  carvedilol (COREG) 6.25 MG tablet TAKE 1 TABLET BY MOUTH  TWICE DAILY WITH A MEAL 12/01/19   Sherren Mocha, MD  Cholecalciferol 10 MCG (400 UNIT) CAPS Take 2 capsules (800 Units total) by mouth daily. 12/15/19   Simmons-Robinson, Riki Sheer, MD  clopidogrel (PLAVIX) 75 MG tablet TAKE 1 TABLET BY MOUTH  DAILY 04/30/20   Sherren Mocha, MD  irbesartan (AVAPRO) 75 MG tablet Take 1 tablet (75 mg total) by mouth daily. 03/01/20   Simmons-Robinson, Makiera, MD  levocetirizine (XYZAL) 5 MG tablet Take 5 mg by mouth as needed for allergies.     [provider]  nitroGLYCERIN (NITROSTAT) 0.4 MG SL tablet Place 1 tablet (0.4 mg total) under the tongue every 5 (five) minutes x 3 doses as needed for chest pain. 02/25/19   Sherren Mocha, MD    Allergies    Amoxicillin  Review of Systems   Review of Systems  Constitutional: Positive for fever.  Musculoskeletal: Positive for back pain.  All other systems reviewed and are negative.   Physical Exam Updated Vital Signs BP (!) 169/59 (BP Location:  Right Arm)   Pulse (!) 56   Temp 98.8 F (37.1 C)   Resp 18   SpO2 97%   Physical Exam Vitals and nursing note reviewed.  Constitutional:      General: She is not in acute distress.    Appearance: Normal appearance. She is well-developed.  HENT:     Head: Normocephalic and atraumatic.     Right Ear: Hearing normal.     Left Ear: Hearing normal.     Nose: Nose normal.  Eyes:     Conjunctiva/sclera: Conjunctivae normal.     Pupils: Pupils are equal, round, and reactive to light.  Cardiovascular:  Rate and Rhythm: Regular rhythm.     Heart sounds: S1 normal and S2 normal. No murmur heard. No friction rub. No gallop.   Pulmonary:     Effort: Pulmonary effort is normal. No respiratory distress.     Breath sounds: Normal breath sounds.  Chest:     Chest wall: No tenderness.  Abdominal:     General: Bowel sounds are normal.     Palpations: Abdomen is soft.     Tenderness: There is no abdominal tenderness. There is no guarding or rebound. Negative signs include Hand's sign and McBurney's sign.     Hernia: No hernia is present.  Musculoskeletal:        General: Normal range of motion.     Cervical back: Normal range of motion and neck supple.     Lumbar back: Spasms and tenderness present. No bony tenderness. Negative right straight leg raise test and negative left straight leg raise test.       Back:  Skin:    General: Skin is warm and dry.     Findings: No rash.  Neurological:     Mental Status: She is alert and oriented to person, place, and time.     GCS: GCS eye subscore is 4. GCS verbal subscore is 5. GCS motor subscore is 6.     Cranial Nerves: No cranial nerve deficit.     Sensory: No sensory deficit.     Coordination: Coordination normal.  Psychiatric:        Speech: Speech normal.        Behavior: Behavior normal.        Thought Content: Thought content normal.     ED Results / Procedures / Treatments   Labs (all labs ordered are listed, but only  abnormal results are displayed) Labs Reviewed  URINALYSIS, ROUTINE W REFLEX MICROSCOPIC - Abnormal; Notable for the following components:      Result Value   APPearance CLOUDY (*)    Specific Gravity, Urine 1.004 (*)    Hgb urine dipstick SMALL (*)    Nitrite POSITIVE (*)    Leukocytes,Ua LARGE (*)    WBC, UA >50 (*)    Bacteria, UA RARE (*)    All other components within normal limits  COMPREHENSIVE METABOLIC PANEL - Abnormal; Notable for the following components:   Glucose, Bld 114 (*)    All other components within normal limits  CBC WITH DIFFERENTIAL/PLATELET - Abnormal; Notable for the following components:   WBC 13.3 (*)    Neutro Abs 10.7 (*)    All other components within normal limits  URINE CULTURE  LIPASE, BLOOD    EKG None  Radiology CT RENAL STONE STUDY  Result Date: 07/09/2020 CLINICAL DATA:  80 year old female with flank pain. EXAM: CT ABDOMEN AND PELVIS WITHOUT CONTRAST TECHNIQUE: Multidetector CT imaging of the abdomen and pelvis was performed following the standard protocol without IV contrast. COMPARISON:  None. FINDINGS: Lower chest: Cardiac size at the upper limits of normal. No pathologic pericardial effusion. Small gastric hiatal hernia. Mild atelectasis or scarring in the lower lobes and medial segment right middle lobe. No pleural effusion. Hepatobiliary: Punctate gallstone. Contracted gallbladder without inflammation. Negative noncontrast liver. Pancreas: Negative. Spleen: Negative; occasional small calcified granulomas within the spleen. Adrenals/Urinary Tract: Normal adrenal glands. Moderate to severe right and mild to moderate left hydronephrosis and hydroureter. The right ureter is dilated up to 14 mm in the abdomen. The left ureter is 9 mm. No nephrolithiasis. And both ureters  remain dilated into the pelvis, along the pelvic side wall, and through the pelvic floor secondary to moderate or severe pelvic laxity. See coronal image 52, and note the ureters  tracking lateral to the cystocele on coronal images 56 and 57. There are superimposed pelvic phleboliths, but no convincing distal ureteral calculi. Superimposed 2 cm urethral diverticulum or un-compressed portion of the lowercystocele just below the pelvic floor on series 3, image 84 and sagittal image 74. Stomach/Bowel: No dilated large or small bowel. Redundant sigmoid and transverse colon segments. Redundant right colon. No large bowel inflammation. Negative terminal ileum. Elongated but normal appendix tracking along the right pelvic side wall on coronal image 43. aside from small hiatal hernia the stomach appears negative. Negative duodenum. No free air, free fluid or mesenteric inflammation. Vascular/Lymphatic: Aortoiliac calcified atherosclerosis. Normal caliber abdominal aorta. No lymphadenopathy. Reproductive: Evidence of aAbsent uterus. Diminutive or absent ovaries. Other: No pelvic free fluid. Musculoskeletal: Diffuse advanced disc and endplate degeneration. Widespread vacuum disc. Grade 1 spondylolisthesis at the lumbosacral junction with facet arthropathy. Superimposed moderate dextroconvex lumbar scoliosis. No acute osseous abnormality identified. IMPRESSION: 1. Moderate to severe bilateral ureteral and renal obstruction, worse on the right, appears secondary to pronounced pelvic floor laxity and Cystocele compressing the bilateral UVJ, rather than to stone disease or other obstructing etiology. Recommend Urology consultation. 2. No other acute or inflammatory process identified in the noncontrast abdomen and pelvis. 3. Advanced degenerative changes in the spine. 4.  Aortic Atherosclerosis (ICD10-I70.0). Electronically Signed   By: Genevie Ann M.D.   On: 07/09/2020 04:49    Procedures Procedures   Medications Ordered in ED Medications  cefTRIAXone (ROCEPHIN) injection 1 g (has no administration in time range)    ED Course  I have reviewed the triage vital signs and the nursing  notes.  Pertinent labs & imaging results that were available during my care of the patient were reviewed by me and considered in my medical decision making (see chart for details).    MDM Rules/Calculators/A&P                          Patient presents to the emergency department for evaluation of right lower back pain.  Patient denies trauma.  Pain is nonradicular.  She does have worsening of pain with bending and twisting which seems musculoskeletal in nature.  Patient does report that she had a fever earlier in the day.  No fever at arrival.  Slight elevation of white blood cell count, otherwise lab work is normal.  Patient appears well.  Vital signs are normal.  No concern for sepsis at this time.  Urinalysis, however, does reflect infection.  Culture has been sent.  Administer Rocephin, continue [place] outpatient.  Patient does not wish to have any pain medication prescribed.  Given return precautions including fever, worsening pain, nausea and vomiting.  Final Clinical Impression(s) / ED Diagnoses Final diagnoses:  Urinary tract infection without hematuria, site unspecified    Rx / DC Orders ED Discharge Orders         Ordered    cefdinir (OMNICEF) 300 MG capsule  2 times daily        07/09/20 8416           Orpah Greek, MD 07/09/20 [phone]

## 2020-07-12 ENCOUNTER — Telehealth: Payer: Self-pay

## 2020-07-12 ENCOUNTER — Telehealth: Payer: Self-pay | Admitting: *Deleted

## 2020-07-12 ENCOUNTER — Other Ambulatory Visit (HOSPITAL_COMMUNITY): Payer: Self-pay

## 2020-07-12 LAB — URINE CULTURE: Culture: >=100000 — AB

## 2020-07-12 MED ORDER — CEFDINIR 300 MG PO CAPS
300.0000 mg | ORAL_CAPSULE | Freq: Two times a day (BID) | ORAL | 0 refills | Status: DC
  Filled 2020-07-12: qty 20, 10d supply, fill #0

## 2020-07-12 MED ORDER — NITROFURANTOIN MONOHYD MACRO 100 MG PO CAPS
100.0000 mg | ORAL_CAPSULE | Freq: Two times a day (BID) | ORAL | 0 refills | Status: AC
  Filled 2020-07-12: qty 14, 7d supply, fill #0

## 2020-07-12 NOTE — Telephone Encounter (Signed)
RX sent for macrobid BID for 7 day course. Cefdinir discontinued.

## 2020-07-12 NOTE — Telephone Encounter (Signed)
Pt called regarding Rx being sent to her mail order pharmacy and requested it to be sent to [facility].  RNCM called in Rx as written to pharmacy of choice.

## 2020-07-12 NOTE — Telephone Encounter (Signed)
Patient calls nurse line stating she was seen in ED and given Cefdinir, however pharmacy will not dispense due to amoxicillin allergy. Patient is requesting PCP send in something different. Please advise.

## 2020-07-13 ENCOUNTER — Telehealth: Payer: Self-pay | Admitting: Emergency Medicine

## 2020-07-13 ENCOUNTER — Other Ambulatory Visit (HOSPITAL_COMMUNITY): Payer: Self-pay

## 2020-07-13 NOTE — Telephone Encounter (Signed)
Post ED Visit - Positive Culture Follow-up  Culture report reviewed by antimicrobial stewardship pharmacist: [place] Team []  Elenor Quinones, Pharm.D. []  Heide Guile, Pharm.D., BCPS AQ-ID []  Parks Neptune, Pharm.D., BCPS []  Alycia Rossetti, Pharm.D., BCPS []  [place], Pharm.D., BCPS, AAHIVP []  Legrand Como, Pharm.D., BCPS, AAHIVP []  Salome Arnt, PharmD, BCPS []  Johnnette Gourd, PharmD, BCPS []  Hughes Better, PharmD, BCPS [x]  Mercy Riding, PharmD []  Laqueta Linden, PharmD, BCPS []  Albertina Parr, PharmD  [place] Team []  Leodis Sias, PharmD []  Lindell Spar, PharmD []  Royetta Asal, PharmD []  Graylin Shiver, Rph []  Rema Fendt) Glennon Mac, PharmD []  Arlyn Dunning, PharmD []  Netta Cedars, PharmD []  Dia Sitter, PharmD []  Leone Haven, PharmD []  Gretta Arab, PharmD []  Theodis Shove, PharmD []  Peggyann Juba, PharmD []  Reuel Boom, PharmD   Positive urine culture Treated with Macrobid (07/12/20 by PCP), organism sensitive to the same and no further patient follow-up is required at this time.  Sandi Raveling Dayja Loveridge 07/13/2020, 10:56 AM

## 2020-07-17 ENCOUNTER — Encounter: Payer: Self-pay | Admitting: Family Medicine

## 2020-07-17 ENCOUNTER — Other Ambulatory Visit: Payer: Self-pay

## 2020-07-17 ENCOUNTER — Ambulatory Visit (INDEPENDENT_AMBULATORY_CARE_PROVIDER_SITE_OTHER): Payer: Medicare Other | Admitting: Family Medicine

## 2020-07-17 VITALS — BP 158/82 | HR 62 | Wt 151.6 lb

## 2020-07-17 DIAGNOSIS — N23 Unspecified renal colic: Secondary | ICD-10-CM

## 2020-07-17 DIAGNOSIS — R300 Dysuria: Secondary | ICD-10-CM | POA: Diagnosis not present

## 2020-07-17 DIAGNOSIS — N10 Acute tubulo-interstitial nephritis: Secondary | ICD-10-CM | POA: Diagnosis not present

## 2020-07-17 DIAGNOSIS — N139 Obstructive and reflux uropathy, unspecified: Secondary | ICD-10-CM

## 2020-07-17 LAB — POCT URINALYSIS DIP (MANUAL ENTRY)
Bilirubin, UA: NEGATIVE
Blood, UA: NEGATIVE
Glucose, UA: NEGATIVE mg/dL
Ketones, POC UA: NEGATIVE mg/dL
Leukocytes, UA: NEGATIVE
Nitrite, UA: NEGATIVE
Protein Ur, POC: NEGATIVE mg/dL
Spec Grav, UA: 1.025 (ref 1.010–1.025)
Urobilinogen, UA: 0.2 E.U./dL
pH, UA: 5.5 (ref 5.0–8.0)

## 2020-07-17 NOTE — Progress Notes (Signed)
     SUBJECTIVE:   CHIEF COMPLAINT / HPI:   Melissa Gibbs is a 80 y.o. female presents for hospital follow up   UTI Right lower back pain started 9 days ago. She went to the ER where she was diagnosed with UTI. She also had a CT renal study but she was never told the results of this. She was prescribed cefdnir but she has difficulty accessing this at [facility] so she only started medications 4 days ago. She was prescribed Macrobid on 07/12/20 for 7 days by Dr Alba Cory. She still has right sided back which has improved slightly. Denies fever or chills.    [facility] Office Visit from 07/17/2020 in [place]  PHQ-9 Total Score 0         PERTINENT  PMH / PSH: STEMI, PAD, HTN , CAD  OBJECTIVE:   BP (!) 158/82   Pulse 62   Wt 151 lb 9.6 oz (68.8 kg)   SpO2 98%   BMI 27.73 kg/m    General: Alert, no acute distress Cardio: Normal S1 and S2, RRR, no r/m/g Pulm: CTAB, normal work of breathing Abdomen: Bowel sounds normal. Abdomen soft and non-tender. No CVA tenderness bilaterally  Extremities: No peripheral edema.  Neuro: Cranial nerves grossly intact   ASSESSMENT/PLAN:   UTI (urinary tract infection) Continue course of Macrobid, symptoms improving.   Urinary tract obstruction Recent CT renal study showed moderate to severe bilateral ureteral and renal obstruction, worse on the right, appears secondary to pronounced pelvic floor laxity and Cystocele compressing the bilateral UVJ, rather than to stone disease or other obstructing etiology. Urology referral recommended however pt was not referred to Urology. Explained CT findings to pt. Referred to Urology.Strict ER precautions given. Follow up in 1-2 weeks with Dr Alba Cory.     Melissa Haw, MD PGY-2 [place]

## 2020-07-17 NOTE — Patient Instructions (Signed)
Thank you for coming to see me today. It was a pleasure. Today we discussed your kidney infection. I recommend continuing the Macrobid. Yoou also need to follow up with nephrology ASAP. I have placed this referral.  Please follow-up with Dr Rosita FireQuentin Cornwall in 1-2 weeks  If you have any questions or concerns, please do not hesitate to call the office at [phone].  Best wishes,   Dr Posey Pronto

## 2020-07-17 NOTE — Progress Notes (Deleted)
    SUBJECTIVE:   CHIEF COMPLAINT / HPI:    PERTINENT  PMH / PSH:   OBJECTIVE:   There were no vitals taken for this visit.  ***  ASSESSMENT/PLAN:   No problem-specific Assessment & Plan notes found for this encounter.     Eulis Foster, MD [place]   {    This will disappear when note is signed, click to select method of visit    :1}

## 2020-07-18 DIAGNOSIS — N139 Obstructive and reflux uropathy, unspecified: Secondary | ICD-10-CM | POA: Insufficient documentation

## 2020-07-18 NOTE — Assessment & Plan Note (Signed)
Recent CT renal study showed moderate to severe bilateral ureteral and renal obstruction, worse on the right, appears secondary to pronounced pelvic floor laxity and Cystocele compressing the bilateral UVJ, rather than to stone disease or other obstructing etiology. Urology referral recommended however pt was not referred to Urology. Explained CT findings to pt. Referred to Urology.Strict ER precautions given. Follow up in 1-2 weeks with Dr Alba Cory.

## 2020-07-18 NOTE — Assessment & Plan Note (Signed)
Continue course of Macrobid, symptoms improving.

## 2020-07-23 ENCOUNTER — Other Ambulatory Visit: Payer: Self-pay

## 2020-07-23 ENCOUNTER — Other Ambulatory Visit (HOSPITAL_COMMUNITY): Payer: Self-pay

## 2020-07-23 DIAGNOSIS — D1801 Hemangioma of skin and subcutaneous tissue: Secondary | ICD-10-CM | POA: Diagnosis not present

## 2020-07-23 DIAGNOSIS — L814 Other melanin hyperpigmentation: Secondary | ICD-10-CM | POA: Diagnosis not present

## 2020-07-23 DIAGNOSIS — L720 Epidermal cyst: Secondary | ICD-10-CM | POA: Diagnosis not present

## 2020-07-23 DIAGNOSIS — L821 Other seborrheic keratosis: Secondary | ICD-10-CM | POA: Diagnosis not present

## 2020-07-23 DIAGNOSIS — L570 Actinic keratosis: Secondary | ICD-10-CM | POA: Diagnosis not present

## 2020-07-23 DIAGNOSIS — Z85828 Personal history of other malignant neoplasm of skin: Secondary | ICD-10-CM | POA: Diagnosis not present

## 2020-07-23 DIAGNOSIS — D220 Melanocytic nevi of lip: Secondary | ICD-10-CM | POA: Diagnosis not present

## 2020-07-23 MED ORDER — NITROGLYCERIN 0.4 MG SL SUBL
0.4000 mg | SUBLINGUAL_TABLET | SUBLINGUAL | 2 refills | Status: DC | PRN
  Filled 2020-07-23: qty 25, 10d supply, fill #0

## 2020-07-23 MED ORDER — IRBESARTAN 75 MG PO TABS: 75.0000 mg | ORAL_TABLET | Freq: Every day | ORAL | 1 refills | Status: DC

## 2020-07-23 NOTE — Telephone Encounter (Signed)
Received a call from patient the requesting requesting a refill on Irbesartan and nitroglycerin. Patient requested Irbesartan be sent to [facility] and the nitroglycerin be sent to [facility].   Reviewed patients chart. Advised patient that I would send her rx's to the pharmacy. She voiced understanding.   Rx(s) sent to pharmacy electronically.

## 2020-07-24 DIAGNOSIS — N130 Hydronephrosis with ureteropelvic junction obstruction: Secondary | ICD-10-CM | POA: Diagnosis not present

## 2020-07-24 DIAGNOSIS — N8111 Cystocele, midline: Secondary | ICD-10-CM | POA: Diagnosis not present

## 2020-07-25 ENCOUNTER — Other Ambulatory Visit (HOSPITAL_COMMUNITY): Payer: Self-pay

## 2020-07-25 MED ORDER — ESTRADIOL 0.1 MG/GM VA CREA
TOPICAL_CREAM | VAGINAL | 3 refills | Status: DC
  Filled 2020-07-25: qty 42.5, 60d supply, fill #0
  Filled 2020-07-26: qty 42.5, 90d supply, fill #0
  Filled 2021-02-22 – 2021-04-01 (×2): qty 42.5, 90d supply, fill #1

## 2020-07-26 ENCOUNTER — Other Ambulatory Visit (HOSPITAL_COMMUNITY): Payer: Self-pay

## 2020-07-27 ENCOUNTER — Other Ambulatory Visit (HOSPITAL_COMMUNITY): Payer: Self-pay

## 2020-07-30 ENCOUNTER — Encounter: Payer: Self-pay | Admitting: Family Medicine

## 2020-08-03 ENCOUNTER — Other Ambulatory Visit (HOSPITAL_COMMUNITY): Payer: Self-pay

## 2020-08-03 DIAGNOSIS — R8271 Bacteriuria: Secondary | ICD-10-CM | POA: Diagnosis not present

## 2020-08-03 DIAGNOSIS — N130 Hydronephrosis with ureteropelvic junction obstruction: Secondary | ICD-10-CM | POA: Diagnosis not present

## 2020-08-03 DIAGNOSIS — N201 Calculus of ureter: Secondary | ICD-10-CM | POA: Diagnosis not present

## 2020-08-03 DIAGNOSIS — N8111 Cystocele, midline: Secondary | ICD-10-CM | POA: Diagnosis not present

## 2020-08-03 DIAGNOSIS — N3000 Acute cystitis without hematuria: Secondary | ICD-10-CM | POA: Diagnosis not present

## 2020-08-03 MED ORDER — CEPHALEXIN 500 MG PO CAPS
500.0000 mg | ORAL_CAPSULE | Freq: Two times a day (BID) | ORAL | 0 refills | Status: DC
  Filled 2020-08-03: qty 14, 7d supply, fill #0

## 2020-09-03 ENCOUNTER — Other Ambulatory Visit: Payer: Medicare Other | Admitting: *Deleted

## 2020-09-03 ENCOUNTER — Other Ambulatory Visit: Payer: Self-pay

## 2020-09-03 DIAGNOSIS — I10 Essential (primary) hypertension: Secondary | ICD-10-CM

## 2020-09-03 DIAGNOSIS — I2510 Atherosclerotic heart disease of native coronary artery without angina pectoris: Secondary | ICD-10-CM | POA: Diagnosis not present

## 2020-09-03 DIAGNOSIS — E782 Mixed hyperlipidemia: Secondary | ICD-10-CM | POA: Diagnosis not present

## 2020-09-03 LAB — COMPREHENSIVE METABOLIC PANEL
ALT: 9 IU/L (ref 0–32)
AST: 16 IU/L (ref 0–40)
Albumin/Globulin Ratio: 1.3 (ref 1.2–2.2)
Albumin: 4.1 g/dL (ref 3.7–4.7)
Alkaline Phosphatase: 74 IU/L (ref 44–121)
BUN/Creatinine Ratio: 16 (ref 12–28)
BUN: 15 mg/dL (ref 8–27)
Bilirubin Total: 0.5 mg/dL (ref 0.0–1.2)
CO2: 24 mmol/L (ref 20–29)
Calcium: 9.1 mg/dL (ref 8.7–10.3)
Chloride: 104 mmol/L (ref 96–106)
Creatinine, Ser: 0.94 mg/dL (ref 0.57–1.00)
Globulin, Total: 3.1 g/dL (ref 1.5–4.5)
Glucose: 95 mg/dL (ref 65–99)
Potassium: 4.5 mmol/L (ref 3.5–5.2)
Sodium: 141 mmol/L (ref 134–144)
Total Protein: 7.2 g/dL (ref 6.0–8.5)
eGFR: 61 mL/min/{1.73_m2} (ref >59)

## 2020-09-03 LAB — LIPID PANEL
Chol/HDL Ratio: 2.6 ratio (ref 0.0–4.4)
Cholesterol, Total: 111 mg/dL (ref 100–199)
HDL: 43 mg/dL (ref >39)
LDL Chol Calc (NIH): 53 mg/dL (ref 0–99)
Triglycerides: 74 mg/dL (ref 0–149)
VLDL Cholesterol Cal: 15 mg/dL (ref 5–40)

## 2020-09-11 NOTE — Progress Notes (Signed)
Cardiology Office Note:    Date:  09/12/2020   ID:  [name], DOB March 18, 1940, MRN [id number]  PCP:  Eulis Foster, MD   [facility] Cardiologist:  Sherren Mocha, MD Cardiology APP:  [facility]      Referring MD: Melissa Gibbs*   Chief Complaint:  Follow-up (CAD)    Patient Profile:    Melissa Gibbs is a 80 y.o. 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: DES to the LCx; staged DES to the LAD Complicated by atrial fibrillation Ischemic cardiomyopathy EF 35-40 Echocardiogram 03/2018: EF 60-65 Hypertension Hyperlipidemia Peripheral Arterial Disease     Prior CV studies: Echocardiogram 04/01/2018 EF 60-65, mild LVH, grade 1 diastolic dysfunction, mild aortic valve sclerosis, trivial AI   Percutaneous coronary intervention 11/23/2017 PCI: 2.75 x 18 mm Sierra DES to mid LAD   Echocardiogram 11/22/2017 EF 35-40, inferolateral/inferior/apical inferior/lateral HK, grade 1 diastolic dysfunction, mild AI, trivial MR mild LAE, mild TR, mild PI, PASP 33   Cardiac catheterization 11/20/2017 LAD proximal 80, mid-distal 80 LCx ostial 100 RCA proximal 60 EF 45, mid inferior and inferoapical akinesis PCI: 4 x 15 mm Sierra DES to the ostial LCx   History of Present Illness: Ms. Melissa Gibbs was last seen in 2/22.  She returns for f/u.  She was evaluated in the emergency room in June for urinary tract infection.  CT scan did demonstrate ureteral obstruction from pelvic floor instability as well as cystocele.  She has a referral pending with urology later this month.  She has continued to feel somewhat poorly since her visit to the emergency room.  She still has some discomfort with urination as well as some vague discomfort in her right flank.  She has not had any fevers.  She did see primary care initially after the emergency room but not since.  She has not had chest discomfort, significant shortness of breath,  orthopnea, syncope.  She notes that her blood pressures at home are typically [id number] systolic.    Past Medical History:  Diagnosis Date   ACS (acute coronary syndrome) ([facility]) 11/20/2017   CAD (coronary artery disease) 11/20/2017   a. Dx STEMI on 11/20/2017 s/p staged PCI with DES to pLCx and DES to mid LAD   Cataract    Hyperlipidemia    Hypertension    Ischemic cardiomyopathy    a. Echo 11/22/2017 w/ LVEF of 35-40%, hypokinesis of basal and mid inferolateral, inferior, and apical inferior and lateral walls, and G1DD   Myocardial infarction [facility])    PAD (peripheral artery disease) ([place])    a. Right radial artery noted to be occluded during heart catheterization in 11/2017.   Paroxysmal atrial fibrillation in setting of acute MI    a. Brief episode of atrial fibrillation in setting of acute STEMI - no chronic anticoagulation needed unless recurrent episodes   Shingles 2000   R flank, mild, self reported, did not present to care    Skin cancer     Current Medications: Current Meds  Medication Sig   alendronate (FOSAMAX) 10 MG tablet TAKE 1 TABLET BY MOUTH  DAILY BEFORE BREAKFAST.  TAKE WITH A FULL GLASS OF  WATER ON AN EMPTY STOMACH   atorvastatin (LIPITOR) 80 MG tablet TAKE 1 TABLET BY MOUTH  DAILY AT 6 PM   carvedilol (COREG) 6.25 MG tablet TAKE 1 TABLET BY MOUTH  TWICE DAILY WITH A MEAL   Cholecalciferol 10 MCG (400 UNIT) CAPS Take 2 capsules (800 Units  total) by mouth daily.   clopidogrel (PLAVIX) 75 MG tablet TAKE 1 TABLET BY MOUTH  DAILY   estradiol (ESTRACE VAGINAL) 0.1 MG/GM vaginal cream Apply 1/2 inch nightly for 2 weeks followed by twice a week thereafter   irbesartan (AVAPRO) 75 MG tablet Take 1 tablet (75 mg total) by mouth daily.   nitroGLYCERIN (NITROSTAT) 0.4 MG SL tablet Place 1 tablet (0.4 mg total) under the tongue every 5 (five) minutes for 3 doses as needed for chest pain.     Allergies:   Amoxicillin   Social History   Tobacco Use   Smoking status: Former     Types: Cigarettes   Smokeless tobacco: Never   Tobacco comments:    quit smoking at age 37  Vaping Use   Vaping Use: Never used  Substance Use Topics   Alcohol use: Yes    Comment: very rarely drinks a wine cooler   Drug use: No     Family Hx: The patient's family history includes Cancer in her father, sister, and sister; Colon cancer in her father; Heart disease in her brother; Heart disease (age of onset: 81) in her mother; Hypertension in her father; Lung cancer in her sister; Melanoma in her sister and another family member. There is no history of Stroke.  Review of Systems  Constitutional: Negative for fever.  Genitourinary:  Positive for dysuria and flank pain.    EKGs/Labs/Other Test Reviewed:    EKG:  EKG is not ordered today.  The ekg ordered today demonstrates N/A  Recent Labs: 07/08/2020: Hemoglobin 12.4; Platelets 272 09/03/2020: ALT 9; BUN 15; Creatinine, Ser 0.94; Potassium 4.5; Sodium 141   Recent Lipid Panel Lab Results  Component Value Date/Time   CHOL 111 09/03/2020 07:58 AM   TRIG 74 09/03/2020 07:58 AM   HDL 43 09/03/2020 07:58 AM   LDLCALC 53 09/03/2020 07:58 AM   LDLDIRECT 139 (H) 08/28/2006 10:17 PM      Risk Assessment/Calculations:      Physical Exam:    VS:  BP (!) 158/72   Pulse 70   Ht '5\' 2"'$  (1.575 m)   Wt 151 lb (68.5 kg)   SpO2 97%   BMI 27.62 kg/m     Wt Readings from Last 3 Encounters:  09/12/20 151 lb (68.5 kg)  07/17/20 151 lb 9.6 oz (68.8 kg)  06/11/20 157 lb 3.2 oz (71.3 kg)     Constitutional:      Appearance: Healthy appearance. Not in distress.  Neck:     Vascular: JVD normal.  Pulmonary:     Effort: Pulmonary effort is normal.     Breath sounds: No wheezing. No rales.  Cardiovascular:     Normal rate. Regular rhythm. Normal S1. Normal S2.      Murmurs: There is no murmur.  Edema:    Peripheral edema absent.  Abdominal:     Palpations: Abdomen is soft.  Musculoskeletal:     Comments: No CVA tenderness  Skin:    General: Skin is warm and dry.  Neurological:     Mental Status: Alert and oriented to person, place and time.     Cranial Nerves: Cranial nerves are intact.         ASSESSMENT & PLAN:    1. Coronary artery disease involving native coronary artery of native heart without angina pectoris History of inferior STEMI in 2019 treated with DES to the LCx and staged DES to the LAD.  EF was 35-40 but improved to 60-65.  She is not having anginal symptoms.  Continue current dose of atorvastatin, carvedilol, irbesartan.  Follow-up in 1 year.  2. Essential hypertension Blood pressures at home are optimal.  Continue current dose of carvedilol, irbesartan.  3. Mixed hyperlipidemia LDL optimal on most recent lab work.  Continue current dose of atorvastatin.    4. Dysuria She was recently seen in emergency room with a urinary tract infection.  She has evidence of ureteral obstruction on CT.  She has a referral to urology pending later this month.  She initially felt well after her antibiotic therapy.  She has had some recurrent symptoms recently that are somewhat vague and may just be related to chronic obstruction.  I will obtain a urinalysis with reflex culture if indicated.  If her urinalysis indicates recurrent infection, she will need follow-up with primary care.  Otherwise she will see urology later this month as planned.   Dispo:  Return in about 1 year (around 09/12/2021) for Routine Follow Up, w/ Richardson Dopp, PA-C.   Medication Adjustments/Labs and Tests Ordered: Current medicines are reviewed at length with the patient today.  Concerns regarding medicines are outlined above.  Tests Ordered: Orders Placed This Encounter  Procedures   Urinalysis, Routine w reflex microscopic   Medication Changes: No orders of the defined types were placed in this encounter.   Signed, Richardson Dopp, PA-C  09/12/2020 9:48 AM    [facility] [place], [place], [place]   [address] Phone: [phone]; Fax: [phone]

## 2020-09-12 ENCOUNTER — Other Ambulatory Visit: Payer: Self-pay

## 2020-09-12 ENCOUNTER — Ambulatory Visit: Payer: Medicare Other | Admitting: Physician Assistant

## 2020-09-12 ENCOUNTER — Encounter: Payer: Self-pay | Admitting: Physician Assistant

## 2020-09-12 VITALS — BP 158/72 | HR 70 | Ht 62.0 in | Wt 151.0 lb

## 2020-09-12 DIAGNOSIS — E782 Mixed hyperlipidemia: Secondary | ICD-10-CM | POA: Diagnosis not present

## 2020-09-12 DIAGNOSIS — R300 Dysuria: Secondary | ICD-10-CM

## 2020-09-12 DIAGNOSIS — I10 Essential (primary) hypertension: Secondary | ICD-10-CM | POA: Diagnosis not present

## 2020-09-12 DIAGNOSIS — I2510 Atherosclerotic heart disease of native coronary artery without angina pectoris: Secondary | ICD-10-CM | POA: Diagnosis not present

## 2020-09-12 NOTE — Patient Instructions (Signed)
Medication Instructions:  Your physician recommends that you continue on your current medications as directed. Please refer to the Current Medication list given to you today.  Labwork: You will have labs drawn today:   Urinary Analysis with Culture if abnormal  Testing/Procedures: None ordered.  Follow-Up: Your physician recommends that you schedule a follow-up appointment in:   12 months with Richardson Dopp, PA   Any Other Special Instructions Will Be Listed Below (If Applicable).     If you need a refill on your cardiac medications before your next appointment, please call your pharmacy.

## 2020-09-13 LAB — URINALYSIS, ROUTINE W REFLEX MICROSCOPIC
Bilirubin, UA: NEGATIVE
Glucose, UA: NEGATIVE
Ketones, UA: NEGATIVE
Nitrite, UA: NEGATIVE
Protein,UA: NEGATIVE
RBC, UA: NEGATIVE
Specific Gravity, UA: 1.016 (ref 1.005–1.030)
Urobilinogen, Ur: 0.2 mg/dL (ref 0.2–1.0)
pH, UA: 5.5 (ref 5.0–7.5)

## 2020-09-13 LAB — MICROSCOPIC EXAMINATION: Casts: NONE SEEN /lpf

## 2020-09-24 DIAGNOSIS — N130 Hydronephrosis with ureteropelvic junction obstruction: Secondary | ICD-10-CM | POA: Diagnosis not present

## 2020-09-24 DIAGNOSIS — N8111 Cystocele, midline: Secondary | ICD-10-CM | POA: Diagnosis not present

## 2020-09-27 DIAGNOSIS — N8111 Cystocele, midline: Secondary | ICD-10-CM | POA: Diagnosis not present

## 2020-10-16 DIAGNOSIS — N8111 Cystocele, midline: Secondary | ICD-10-CM | POA: Diagnosis not present

## 2020-10-16 DIAGNOSIS — M6289 Other specified disorders of muscle: Secondary | ICD-10-CM | POA: Diagnosis not present

## 2020-10-16 DIAGNOSIS — M6281 Muscle weakness (generalized): Secondary | ICD-10-CM | POA: Diagnosis not present

## 2020-10-24 DIAGNOSIS — M6289 Other specified disorders of muscle: Secondary | ICD-10-CM | POA: Diagnosis not present

## 2020-10-24 DIAGNOSIS — M62838 Other muscle spasm: Secondary | ICD-10-CM | POA: Diagnosis not present

## 2020-10-24 DIAGNOSIS — M6281 Muscle weakness (generalized): Secondary | ICD-10-CM | POA: Diagnosis not present

## 2020-10-24 DIAGNOSIS — N8110 Cystocele, unspecified: Secondary | ICD-10-CM | POA: Diagnosis not present

## 2020-10-24 NOTE — Progress Notes (Signed)
SUBJECTIVE:   CHIEF COMPLAINT / HPI: physical   Annual Examination Female over 80 yo  I reviewed the following patient responses on our Physical Exam Form Tobacco use and candidacy for lung cancer screening, former stopped in 1973  Alcohol Use, rarely drinks now as she does not want to drink alone  Weight, would like to lose  Exercise, interested in yoga  Risk for STI, low, patient denies sexual activity  Fall risk Advanced Directive, patient reports she has completed notarized form, daughter is her Altha [employer] inspector) Jeanella Flattery, does not wish to be on prolonged life support  Prolapse: Urology follow up scheduled for 10/3 or 10/4, has been doing pelvic floor exercises with PT and now has pessary that she rpoerts has helped.   PHQ9 score reviewed  Blood pressure reviewed  I considered the following items based upon USPSTF recommendations: Colon cancer screening Osteoporosis screening, osteoporosis on alendronate  Cholesterol screening, completed in August  Immunizations - Recommended Influenza, Covid,declines Shingles, & Tetanus vaccine, agreeable  Depressed mood and fatigue Patient states that she is often alone and lives alone.  She states that she was previously taking classes at the senior center to interact with people more but has stopped since everything happened with her neurological symptoms.  She reports that she has been gaining weight and would like to lose a few pounds.  Patient denies thoughts of wanting to end her life.  She also reports having "no energy".  PERTINENT  PMH / PSH:  HTN  HLD  PAD  pAF   OBJECTIVE:   BP (!) 154/80   Pulse 80   Ht 5\' 2"  (1.575 m)   Wt 151 lb 2 oz (68.5 kg)   SpO2 98%   BMI 27.64 kg/m   Physical Exam Constitutional:      General: She is not in acute distress.    Appearance: Normal appearance. She is not ill-appearing, toxic-appearing or diaphoretic.  HENT:     Right Ear: External ear normal.     Left Ear: External ear  normal.     Nose: Nose normal.  Eyes:     General: No scleral icterus.       Right eye: No discharge.        Left eye: No discharge.     Conjunctiva/sclera: Conjunctivae normal.  Cardiovascular:     Rate and Rhythm: Normal rate and regular rhythm.     Pulses: Normal pulses.     Heart sounds: Normal heart sounds.  Pulmonary:     Effort: Pulmonary effort is normal. No respiratory distress.     Breath sounds: Normal breath sounds. No wheezing, rhonchi or rales.  Abdominal:     General: Bowel sounds are normal. There is no distension.     Palpations: Abdomen is soft.     Tenderness: There is no abdominal tenderness.  Skin:    Capillary Refill: Capillary refill takes less than 2 seconds.  Neurological:     Mental Status: She is alert.    ASSESSMENT/PLAN:   Depressed mood Patient reports that her mood has been good.  States she is often alone and lives on her own.  PHQ-9 score of 18 today.  Patient would like to consider starting therapy. List given a potential therapist for patient We will follow-up in 1 month  Healthcare maintenance Patient declines shingles vaccine Patient agreeable to flu vaccine, given today  Fatigue Patient reports having decreased energy at baseline, believes that some of this is attributed to her  depressed mood. Check CBC for anemia Check TSH for any thyroid abnormalities We will follow-up with results once available Patient to follow-up in 1 month     Eulis Foster, MD [place]

## 2020-10-24 NOTE — Patient Instructions (Addendum)
It was a pleasure to see you today!  Thank you for choosing [facility] for your primary care.   Melissa Gibbs was seen for a physical.   Our plans for today were: Today we will check blood work to see if your thyroid or any type of anemia to be contributing to your feelings of fatigue.  I will follow-up with you once the results are available. I have also provided a list of therapists below for you to contact to help with starting therapy to discuss your mood. Please plan to follow-up with me next month to check on your mood as well as your blood pressure. Please make sure you continue to take your blood pressure medications on a daily basis to help with your levels.  To keep you healthy, please keep in mind the following health maintenance items that you are due for:   Colonoscopy  Tetanus Vaccine  COVID vaccine  Influenza vaccine   You should return to our clinic in 1 month.  Best Wishes,   Dr. Alba Cory   Psychiatry Resource List (Adults and Children) Most of these providers will take Medicaid. please consult your insurance for a complete and updated list of available providers. When calling to make an appointment have your insurance information available to confirm you are covered.   [facility]:Psychiatry and Counseling [address] [place], [place] [address] [phone]  [facility]  [place], [place]:   [facility]: [address]     [phone]   Linna Hoff: [place]. [place],        [phone] [place]: [place],    [place]: [phone] [address],                   [phone] Children: [place] [address]         [phone]  [place] (virtual only) [phone]    [facility]  (Psychiatry  only; Adults /children 12 and over, will take Medicaid)  [place], [place], [place] [address]       [phone]   [facility] (Psychiatry & counseling ; adults & children ; will take Medicaid [address]  [address]  [place] [address]  Go on-line to complete referral ( [email or web address] [phone]    (Spanish speaking therapists)  [facility]  Psychiatry & counseling; Adults and children;  Call Registration prior to scheduling an appointment [phone] [place]    [place], [place] [address]    [phone]  [facility] (Psychiatry & counseling; adults & children; Medicare no Medicaid)  [place], [place]  [address]      [phone]    [facility] (up to age 5)  Psychiatry & counseling ,will take Medicaid, must do counseling to receive psychiatry services  [address] [place] [address]        ([facility] (Psychiatry & counseling; adults & children; will take Medicaid) Will need a referral from provider [address],  [place], [place]  [phone]   [facility] --- Walk-In Mon-Friday 8am-3pm ( will take Medicaid, Psychiatry, Adults & children,  [address], [place], [place]   [phone]   [facility]--, Walk-in M-F 8am-12pm and 1pm -3pm   (Counseling, Psychiatry, will take Medicaid, adults & children)  [address] [place], [place]  [phone]

## 2020-10-25 ENCOUNTER — Ambulatory Visit (INDEPENDENT_AMBULATORY_CARE_PROVIDER_SITE_OTHER): Payer: Medicare Other | Admitting: Family Medicine

## 2020-10-25 ENCOUNTER — Encounter: Payer: Self-pay | Admitting: Family Medicine

## 2020-10-25 ENCOUNTER — Other Ambulatory Visit: Payer: Self-pay

## 2020-10-25 VITALS — BP 154/80 | HR 80 | Ht 62.0 in | Wt 151.1 lb

## 2020-10-25 DIAGNOSIS — Z23 Encounter for immunization: Secondary | ICD-10-CM

## 2020-10-25 DIAGNOSIS — R4589 Other symptoms and signs involving emotional state: Secondary | ICD-10-CM | POA: Insufficient documentation

## 2020-10-25 DIAGNOSIS — R5383 Other fatigue: Secondary | ICD-10-CM

## 2020-10-25 DIAGNOSIS — Z0000 Encounter for general adult medical examination without abnormal findings: Secondary | ICD-10-CM | POA: Diagnosis not present

## 2020-10-25 MED ORDER — TETANUS-DIPHTH-ACELL PERTUSSIS 5-2.5-18.5 LF-MCG/0.5 IM SUSY: 0.5000 mL | PREFILLED_SYRINGE | Freq: Once | INTRAMUSCULAR | 0 refills | Status: AC

## 2020-10-25 NOTE — Assessment & Plan Note (Signed)
Patient reports that her mood has been good.  States she is often alone and lives on her own.  PHQ-9 score of 18 today.  Patient would like to consider starting therapy. List given a potential therapist for patient We will follow-up in 1 month

## 2020-10-25 NOTE — Assessment & Plan Note (Signed)
Patient reports having decreased energy at baseline, believes that some of this is attributed to her depressed mood. Check CBC for anemia Check TSH for any thyroid abnormalities We will follow-up with results once available Patient to follow-up in 1 month

## 2020-10-25 NOTE — Assessment & Plan Note (Signed)
Patient declines shingles vaccine Patient agreeable to flu vaccine, given today

## 2020-10-26 LAB — CBC
Hematocrit: 38.2 % (ref 34.0–46.6)
Hemoglobin: 12.1 g/dL (ref 11.1–15.9)
MCH: 29.1 pg (ref 26.6–33.0)
MCHC: 31.7 g/dL (ref 31.5–35.7)
MCV: 92 fL (ref 79–97)
Platelets: 288 10*3/uL (ref 150–450)
RBC: 4.16 x10E6/uL (ref 3.77–5.28)
RDW: 12.5 % (ref 11.7–15.4)
WBC: 7.3 10*3/uL (ref 3.4–10.8)

## 2020-10-26 LAB — TSH: TSH: 1.11 u[IU]/mL (ref 0.450–4.500)

## 2020-11-06 DIAGNOSIS — N8110 Cystocele, unspecified: Secondary | ICD-10-CM | POA: Diagnosis not present

## 2020-11-06 DIAGNOSIS — M62838 Other muscle spasm: Secondary | ICD-10-CM | POA: Diagnosis not present

## 2020-11-06 DIAGNOSIS — M6289 Other specified disorders of muscle: Secondary | ICD-10-CM | POA: Diagnosis not present

## 2020-11-06 DIAGNOSIS — N8111 Cystocele, midline: Secondary | ICD-10-CM | POA: Diagnosis not present

## 2020-11-06 DIAGNOSIS — M6281 Muscle weakness (generalized): Secondary | ICD-10-CM | POA: Diagnosis not present

## 2020-11-22 ENCOUNTER — Other Ambulatory Visit: Payer: Self-pay

## 2020-11-22 ENCOUNTER — Encounter: Payer: Self-pay | Admitting: Family Medicine

## 2020-11-22 ENCOUNTER — Ambulatory Visit (INDEPENDENT_AMBULATORY_CARE_PROVIDER_SITE_OTHER): Payer: Medicare Other

## 2020-11-22 ENCOUNTER — Ambulatory Visit (INDEPENDENT_AMBULATORY_CARE_PROVIDER_SITE_OTHER): Payer: Medicare Other | Admitting: Family Medicine

## 2020-11-22 DIAGNOSIS — Z23 Encounter for immunization: Secondary | ICD-10-CM | POA: Diagnosis not present

## 2020-11-22 DIAGNOSIS — Z0000 Encounter for general adult medical examination without abnormal findings: Secondary | ICD-10-CM | POA: Diagnosis not present

## 2020-11-22 DIAGNOSIS — R4589 Other symptoms and signs involving emotional state: Secondary | ICD-10-CM | POA: Diagnosis not present

## 2020-11-22 NOTE — Progress Notes (Signed)
    SUBJECTIVE:   CHIEF COMPLAINT / HPI: f/u for depressed mood with fatigue   During last visit, patient was given list of therapist resources. She reports that since then she has not set up an appointment with a therapist at this time. In regards to her mood, she reports is much improved. Review of labs showed no signs of anemia and TSH was within normal limits. Her PHQ9 score today is 3.  She states that since improvement of her issues with her bladder prolapse, she has felt like stress and mood and energy have improved.  She reports that throughout the summer she is unable to get much of anything done however has been more active than to work in her yard recently.  HM  Patient recommended for tetanus, patient states that she was previously given prescription for tetanus but has not had his vaccine administered. Patient reports that she previously had shingles and does not wish to have shingles vaccine. Patient offered referral for colonoscopy, patient declines.   PERTINENT  PMH / PSH:  Depressed Mood  Fatigue  AF HTN Hx of STEMI  HLD  OBJECTIVE:   BP 140/72   Pulse 69   Ht 5\' 2"  (1.575 m)   Wt 151 lb 3.2 oz (68.6 kg)   SpO2 99%   BMI 27.65 kg/m   Physical Exam Constitutional:      General: She is not in acute distress.    Appearance: Normal appearance. She is normal weight. She is not ill-appearing, toxic-appearing or diaphoretic.  HENT:     Nose: Nose normal.  Eyes:     General: No scleral icterus.    Conjunctiva/sclera: Conjunctivae normal.  Cardiovascular:     Rate and Rhythm: Normal rate and regular rhythm.     Pulses: Normal pulses.     Heart sounds: Normal heart sounds.    No friction rub.  Pulmonary:     Effort: Pulmonary effort is normal.     Breath sounds: No wheezing or rales.  Abdominal:     General: Bowel sounds are normal.     Palpations: Abdomen is soft.     Tenderness: There is no abdominal tenderness.  Musculoskeletal:     Right lower leg: No  edema.     Left lower leg: No edema.  Skin:    Capillary Refill: Capillary refill takes less than 2 seconds.  Neurological:     Mental Status: She is alert and oriented to person, place, and time.  Psychiatric:        Mood and Affect: Mood normal.        Thought Content: Thought content normal.    ASSESSMENT/PLAN:   Depressed mood Improved mood and PHQ9 score with addressing of bladder problems. Patient plans to set up appt with therapist for talk therapy.   Healthcare maintenance Patient declines colonoscopy referral, shingles vaccine prescription. Already has prescription for tetanus vaccination     Eulis Foster, MD [place]

## 2020-11-22 NOTE — Patient Instructions (Signed)
It was a pleasure to see you today!  Thank you for choosing [facility] for your primary care.   Melissa Gibbs was seen for mood and blood pressure follow-up.   Our plans for today were: For your blood pressure, recommend following up with me in 1 month in the meantime please continue your blood pressure medications on a daily basis as well as measuring your blood pressure once daily. Please let us know if your blood pressure is consistently greater than 150/90 or if you develop any headaches or blurry vision.   You should return to our clinic in 4 weeks for blood pressure.   Best Wishes,   Dr. Alba Cory

## 2020-11-27 NOTE — Assessment & Plan Note (Signed)
Patient declines colonoscopy referral, shingles vaccine prescription. Already has prescription for tetanus vaccination

## 2020-11-27 NOTE — Assessment & Plan Note (Signed)
Improved mood and PHQ9 score with addressing of bladder problems. Patient plans to set up appt with therapist for talk therapy.

## 2020-12-05 DIAGNOSIS — M6289 Other specified disorders of muscle: Secondary | ICD-10-CM | POA: Diagnosis not present

## 2020-12-05 DIAGNOSIS — M62838 Other muscle spasm: Secondary | ICD-10-CM | POA: Diagnosis not present

## 2020-12-05 DIAGNOSIS — N8110 Cystocele, unspecified: Secondary | ICD-10-CM | POA: Diagnosis not present

## 2020-12-05 DIAGNOSIS — M6281 Muscle weakness (generalized): Secondary | ICD-10-CM | POA: Diagnosis not present

## 2020-12-13 ENCOUNTER — Other Ambulatory Visit: Payer: Self-pay | Admitting: Cardiovascular Disease

## 2020-12-19 NOTE — Progress Notes (Signed)
    SUBJECTIVE:   CHIEF COMPLAINT / HPI: blood pressure follow up  Previous visit 1 month ago, patient was noted to have elevated BP.  Today blood pressure continues to be elevated at 164/82 on initial check followed by 152/92 on repeat check.  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  Patient states that her blood pressures at home are normal for the most part but often will have some elevated blood pressure measurements.  Blood pressure log today shows range of 130s to max of 037 systolic blood pressures from November 1 up until today.  Patient states that she does not miss any doses of her blood pressure medications.  She normally states the medications in the morning.   PERTINENT  PMH / PSH: Hypertension  OBJECTIVE:   BP (!) 164/82   Pulse 74   Ht 5\' 2"  (1.575 m)   Wt 153 lb 6.4 oz (69.6 kg)   SpO2 98%   BMI 28.06 kg/m   General: Elderly female appearing stated age in no acute distress Cardio: Normal S1 and S2, no S3 or S4. Rhythm is regular. No murmurs or rubs.  Bilateral radial pulses palpable Pulm: Clear to auscultation bilaterally, no crackles, wheezing, or diminished breath sounds. Normal respiratory effort, stable on room air Abdomen: Bowel sounds normal. Abdomen soft and non-tender.  Extremities: Trace peripheral edema. Warm & well perfused.  Neuro: pt alert and oriented x4   ASSESSMENT/PLAN:   Hypertension Patient with elevated blood pressures on home measurement record as well as here in office for 2 visits.  Will increase irbesartan from 75 mg daily to 150 mg daily.  Patient to follow-up in 1 week.  Patient request that prescription be sent to mail order pharmacy.  Patient states that they do deliver on Saturdays.  Encourage patient to schedule follow-up after having at least 4 days of medication.  Patient is agreeable with this plan.  Counseled patient to monitor for any symptoms of  lightheadedness, dizziness or blood pressures that are less than 100/60.  We will plan to check BMP at follow-up visit.     Eulis Foster, MD [place]

## 2020-12-20 ENCOUNTER — Other Ambulatory Visit: Payer: Self-pay

## 2020-12-20 ENCOUNTER — Ambulatory Visit (INDEPENDENT_AMBULATORY_CARE_PROVIDER_SITE_OTHER): Payer: Medicare Other | Admitting: Family Medicine

## 2020-12-20 ENCOUNTER — Encounter: Payer: Self-pay | Admitting: Family Medicine

## 2020-12-20 DIAGNOSIS — I10 Essential (primary) hypertension: Secondary | ICD-10-CM | POA: Diagnosis not present

## 2020-12-20 MED ORDER — IRBESARTAN 150 MG PO TABS: 150.0000 mg | ORAL_TABLET | Freq: Every day | ORAL | 1 refills | Status: DC

## 2020-12-20 NOTE — Patient Instructions (Addendum)
I have increased the dose of your blood pressure medication to 150 mg daily over the irbesartan.  Please plan to follow-up with [place] in 1 week for another blood pressure check and to make sure you are tolerating this new dose.  We will check blood work at that time.  In the meantime please be mindful if you develop any headaches, blurry vision, chest pain or weakness.  If you develop the symptoms in the setting of elevated blood pressure, recommend being evaluated in the emergency department as soon as possible.  Please follow-up with me if you have any lightheadedness or dizziness after starting this new medication or if you notice that your blood pressure is less than 100/60.

## 2020-12-20 NOTE — Assessment & Plan Note (Signed)
Patient with elevated blood pressures on home measurement record as well as here in office for 2 visits.  Will increase irbesartan from 75 mg daily to 150 mg daily.  Patient to follow-up in 1 week.  Patient request that prescription be sent to mail order pharmacy.  Patient states that they do deliver on Saturdays.  Encourage patient to schedule follow-up after having at least 4 days of medication.  Patient is agreeable with this plan.  Counseled patient to monitor for any symptoms of lightheadedness, dizziness or blood pressures that are less than 100/60.  We will plan to check BMP at follow-up visit.

## 2020-12-24 ENCOUNTER — Other Ambulatory Visit: Payer: Self-pay

## 2020-12-31 ENCOUNTER — Telehealth: Payer: Self-pay

## 2020-12-31 NOTE — Telephone Encounter (Signed)
Agree with UA at 11/30 appt. Will evaluate urinary symptoms at that time.   Eulis Foster, MD [place], PGY-3 [phone]

## 2020-12-31 NOTE — Telephone Encounter (Signed)
Patient calls nurse line reporting back pain for ~5 days. Patient reports she feels like she is getting a bladder infection. Patient denies frequency, dysuria, fever or chills. Patient reports she contacted [facility] and they request PCP do a UA.   Patient already has a scheduled apt on 11/30 with PCP.   Patient advised to call if symptoms worsen between now and then.

## 2021-01-01 NOTE — Progress Notes (Signed)
    SUBJECTIVE:   CHIEF COMPLAINT / HPI: HTN f/u & back pain  Hypertension Patient noted to be hypertensive at last visit, she was started on irbesartan 150 mg from 75 mg.  Patient reports that she has been able to adhere to new dose of irbesartan he has not had any episodes of hypotension.  She has brought a log of her blood pressures which range from 373S to 287 systolic prior to her medication in range from 112 to 140s following administration of her medication.  She denies any headache, lightheadedness, blurry vision, dizziness or chest pain.   Lower back pain Patient reports that she noticed lumbar back pain that developed initially on the left side of her lumbar back and then started on the right side.  Patient reports that prior to onset of this back pain, she had been rearranging her In her kitchen and using a stepstool to clean all the pots and dishes from her overhead cabinets.  She states that she was concerned that this back pain may be due to a renal infection or bladder infection so she wanted to make sure that this was not the case.  She denies burning with urination, fevers, chills, hematuria, foul-smelling urine or increased urinary frequency.  Patient states that this lumbar back pain appears to be improving without intervention so she thinks it is likely more musculoskeletal.    PERTINENT  PMH / PSH:  Hypertension STEMI PAD  OBJECTIVE:   BP 137/81   Pulse 70   Ht 5\' 2"  (1.575 m)   Wt 152 lb 3.2 oz (69 kg)   SpO2 100%   BMI 27.84 kg/m   General: female appearing stated age in no acute distress Cardio: Normal S1 and S2, no S3 or S4. Rhythm is regular. No murmurs or rubs.  Bilateral radial pulses palpable Pulm: Clear to auscultation bilaterally, no crackles, wheezing, or diminished breath sounds. Normal respiratory effort, stable on RA Abdomen: Bowel sounds normal. Abdomen soft and non-tender. No CVA tenderness  Extremities: No peripheral edema. Warm/ well perfused.   Back: normal and full ROM in bilateral distributions, no erythema, ecchymosis, nor swelling noted on back   ASSESSMENT/PLAN:   Hypertension 137/81 on recheck of BP  Will continue 150 mg irbesartan along with 6.25mg  coreg  Patient to continue monitoring BP once daily   Back pain Patient with likely musculoskeletal back pain given improved symptoms with rest and no signs of infection nor true urinary symptoms. Recommended patient do topicals and Tylenol therapy for any persistent pain, patient agreeable with this plan Given patient's report of urine symptoms during phone call for follow-up scheduling, UA was checked today.     Eulis Foster, MD [place]

## 2021-01-02 ENCOUNTER — Encounter: Payer: Self-pay | Admitting: Family Medicine

## 2021-01-02 ENCOUNTER — Other Ambulatory Visit: Payer: Self-pay

## 2021-01-02 ENCOUNTER — Ambulatory Visit (INDEPENDENT_AMBULATORY_CARE_PROVIDER_SITE_OTHER): Payer: Medicare Other | Admitting: Family Medicine

## 2021-01-02 VITALS — BP 137/81 | HR 70 | Ht 62.0 in | Wt 152.2 lb

## 2021-01-02 DIAGNOSIS — M549 Dorsalgia, unspecified: Secondary | ICD-10-CM | POA: Insufficient documentation

## 2021-01-02 DIAGNOSIS — I10 Essential (primary) hypertension: Secondary | ICD-10-CM | POA: Diagnosis not present

## 2021-01-02 DIAGNOSIS — R300 Dysuria: Secondary | ICD-10-CM | POA: Diagnosis not present

## 2021-01-02 DIAGNOSIS — M545 Low back pain, unspecified: Secondary | ICD-10-CM | POA: Diagnosis not present

## 2021-01-02 LAB — POCT URINALYSIS DIP (MANUAL ENTRY)
Bilirubin, UA: NEGATIVE
Glucose, UA: NEGATIVE mg/dL
Nitrite, UA: NEGATIVE
Protein Ur, POC: NEGATIVE mg/dL
Spec Grav, UA: >=1.03 — AB (ref 1.010–1.025)
Urobilinogen, UA: 0.2 E.U./dL
pH, UA: 5.5 (ref 5.0–8.0)

## 2021-01-02 NOTE — Assessment & Plan Note (Signed)
Patient with likely musculoskeletal back pain given improved symptoms with rest and no signs of infection nor true urinary symptoms. Recommended patient do topicals and Tylenol therapy for any persistent pain, patient agreeable with this plan Given patient's report of urine symptoms during phone call for follow-up scheduling, UA was checked today.

## 2021-01-02 NOTE — Assessment & Plan Note (Signed)
137/81 on recheck of BP  Will continue 150 mg irbesartan along with 6.25mg  coreg  Patient to continue monitoring BP once daily

## 2021-01-02 NOTE — Patient Instructions (Addendum)
Your blood pressure improved after resting.  I recommended she continue to take your irbesartan 150 mg as well as your carvedilol 6.25 mg.  Your urine sample today shows that you may be slightly dehydrated as well as a small amount of bleeding, I recommend following up in 1-2 weeks for repeat urine sample to make sure that this is resolved.  This could be due to adjusting the pessary and causing some mucosal irritation that may have led to some blood being in her urinary, however we will recheck to make sure that this is not the case.  If you have not already done so, you are due for your tetanus vaccine. Please let me know if you need another prescription for this.

## 2021-01-05 LAB — URINE CULTURE

## 2021-02-04 ENCOUNTER — Other Ambulatory Visit: Payer: Self-pay | Admitting: Family Medicine

## 2021-02-12 ENCOUNTER — Telehealth: Payer: Self-pay

## 2021-02-12 NOTE — Telephone Encounter (Signed)
Patient LVM on nurse line regarding cold symptoms for the last few weeks. Patient was requesting returned phone call to explain how to perform home COVID testing.   Attempted to return call to patient. No answer, left voicemail asking patient to return call.   Talbot Grumbling, RN

## 2021-02-22 ENCOUNTER — Other Ambulatory Visit (HOSPITAL_COMMUNITY): Payer: Self-pay

## 2021-02-22 DIAGNOSIS — N8111 Cystocele, midline: Secondary | ICD-10-CM | POA: Diagnosis not present

## 2021-04-01 ENCOUNTER — Other Ambulatory Visit (HOSPITAL_COMMUNITY): Payer: Self-pay

## 2021-04-25 ENCOUNTER — Other Ambulatory Visit: Payer: Self-pay | Admitting: Cardiovascular Disease

## 2021-06-10 ENCOUNTER — Other Ambulatory Visit: Payer: Self-pay | Admitting: Cardiovascular Disease

## 2021-06-10 ENCOUNTER — Other Ambulatory Visit: Payer: Self-pay | Admitting: Family Medicine

## 2021-07-09 ENCOUNTER — Encounter: Payer: Self-pay | Admitting: *Deleted

## 2021-07-09 NOTE — Progress Notes (Signed)
    SUBJECTIVE:   CHIEF COMPLAINT / HPI: HTN f/u   HTN: Took med this morning. BP waxes and wanes at home.    Ankle swelling: Right ankle swelling couple weeks ago, feels uncomfortable, feel tight. She thinks she may have over did the exercising recently.  Trying lift lift and sit-ups x few days per week. After exercise, pain started. Improves during the nighttime, and swells during the day.She denies taking any pain medications.    PERTINENT  PMH / PSH:   HTN  Hx of STEMI  CAD  PAD  Cystocele  OBJECTIVE:   BP (!) 169/80   Pulse 61   Ht '5\' 2"'$  (1.575 m)   Wt 159 lb 3.2 oz (72.2 kg)   SpO2 97%   BMI 29.12 kg/m   Physical Exam Constitutional:      Appearance: She is normal weight.  HENT:     Right Ear: External ear normal.     Left Ear: External ear normal.     Nose: Nose normal.     Mouth/Throat:     Pharynx: Oropharynx is clear.  Cardiovascular:     Rate and Rhythm: Normal rate and regular rhythm.  Pulmonary:     Effort: Pulmonary effort is normal.  Abdominal:     General: Bowel sounds are normal.     Palpations: Abdomen is soft.  Musculoskeletal:     Cervical back: Normal range of motion.     Right ankle: Swelling present. No deformity, ecchymosis or lacerations. No tenderness. Normal range of motion. Anterior drawer test negative. Normal pulse.     Right Achilles Tendon: Normal. No tenderness.     Left ankle: No swelling, deformity, ecchymosis or lacerations. No tenderness. Normal range of motion.     Left Achilles Tendon: Normal. No tenderness.     Right foot: Normal range of motion. No tenderness. Normal pulse.  Skin:    General: Skin is warm.  Neurological:     General: No focal deficit present.     Mental Status: She is alert and oriented to person, place, and time.     ASSESSMENT/PLAN:   Pain and swelling of ankle, right Ankle swelling without tenderness and no red flag findings on exam  Recommended keeping foot elevated to hlp decrease swelling   Recommended Tylenol if she begins to have pain  Will send for right foot/ankle xray to assess for potential arthritis, low suspicion for fracture given denial of trauma to ankle   Hypertension BP elevated with improvement on repeat check  Patient has had issues with hypotension and dizziness in the past so in setting of elevated BP, will recommend patient for ambulatory blood pressure monitoring  Continue coreg & irbesartan  F/u in 2-3 weeks      Eulis Foster, MD [place]

## 2021-07-10 ENCOUNTER — Encounter: Payer: Self-pay | Admitting: Family Medicine

## 2021-07-10 ENCOUNTER — Ambulatory Visit
Admission: RE | Admit: 2021-07-10 | Discharge: 2021-07-10 | Disposition: A | Discharge status: Home / Self Care | Payer: Medicare Other | Source: Ambulatory Visit | Attending: Family Medicine | Admitting: Family Medicine

## 2021-07-10 ENCOUNTER — Ambulatory Visit (INDEPENDENT_AMBULATORY_CARE_PROVIDER_SITE_OTHER): Payer: Medicare Other | Admitting: Family Medicine

## 2021-07-10 VITALS — BP 169/80 | HR 61 | Ht 62.0 in | Wt 159.2 lb

## 2021-07-10 DIAGNOSIS — M25471 Effusion, right ankle: Secondary | ICD-10-CM

## 2021-07-10 DIAGNOSIS — M19071 Primary osteoarthritis, right ankle and foot: Secondary | ICD-10-CM | POA: Diagnosis not present

## 2021-07-10 DIAGNOSIS — M25571 Pain in right ankle and joints of right foot: Secondary | ICD-10-CM

## 2021-07-10 DIAGNOSIS — M7731 Calcaneal spur, right foot: Secondary | ICD-10-CM | POA: Diagnosis not present

## 2021-07-10 DIAGNOSIS — I10 Essential (primary) hypertension: Secondary | ICD-10-CM

## 2021-07-10 DIAGNOSIS — M7989 Other specified soft tissue disorders: Secondary | ICD-10-CM | POA: Diagnosis not present

## 2021-07-10 NOTE — Patient Instructions (Addendum)
Please go to [facility] imaging located at [address] for x-ray of your right ankle for your swelling.  I recommend that you be scheduled with Dr. Valentina Lucks to have ambulatory blood pressure monitoring so that we can see if we need to adjust your medications to help control your blood pressure better without making it too low.  Please plan to follow-up with me in 2-3 weeks after you have been able to work with Dr. Valentina Lucks.  Please use Tylenol as needed for any ankle pain.  I recommend that she apply ice to your ankle to help with the swelling and elevate your ankle at the same level of your heart is much as possible throughout the day.  RICE Therapy for Routine Care of Injuries Many injuries can be cared for with rest, ice, compression, and elevation (RICE therapy). This includes: Resting the injured body part. Putting ice on the injury. Putting pressure (compression) on the injury. Raising the injured part (elevation). Using RICE therapy can help to lessen pain and swelling. Supplies needed: Ice. Plastic bag. Towel. Elastic bandage. Pillow or pillows to raise your injured body part. How to care for your injury with RICE therapy Rest Try to rest the injured part of your body. You can go back to your normal activities when your doctor says it is okay to do them and when you can do them without pain. If you rest the injury too much, it may not heal as well. Some injuries heal better with early movement instead of resting for too long. Ask your doctor if you should do exercises to help your injury get better. Ice  If told, put ice on the injured area. To do this: Put ice in a plastic bag. Place a towel between your skin and the bag. Leave the ice on for 20 minutes, 2-3 times a day. Take off the ice if your skin turns bright red. This is very important. If you cannot feel pain, heat, or cold, you have a greater risk of damage to the area. Do not put ice on your bare skin. Use ice for as  many days as your doctor tells you to use it. Compression Put pressure on the injured area. This can be done with an elastic bandage. If this type of bandage has been put on your injury: Follow instructions on the package the bandage came in about how to use it. Do not wrap the bandage too tightly. Wrap the bandage more loosely if part of your body beyond the bandage is blue, swollen, cold, painful, or loses feeling. Take off the bandage and put it on again every 3-4 hours or as told by your doctor. See your doctor if the bandage seems to make your problems worse.  Elevation Raise the injured area above the level of your heart while you are sitting or lying down. Follow these instructions at home: If your symptoms get worse or last a long time, make a follow-up appointment with your doctor. You may need to have imaging tests, such as X-rays or an MRI. If you have imaging tests, ask how to get your results when they are ready. Return to your normal activities when your doctor says that it is safe. Keep all follow-up visits. Contact a doctor if: You keep having pain and swelling. Your symptoms get worse. Get help right away if: You have sudden, very bad pain at your injury or lower than your injury. You have redness or more swelling around your injury. You  have tingling or numbness at your injury or lower than your injury, and it does not go away when you take off the bandage. Summary Many injuries can be cared for using rest, ice, compression, and elevation (RICE therapy). You can go back to your normal activities when your doctor says it is okay and when you can do them without pain. Put ice on the injured area as told by your doctor. Get help if your symptoms get worse or if you keep having pain and swelling. This information is not intended to replace advice given to you by your health care provider. Make sure you discuss any questions you have with your health care provider. Document  Revised: 11/10/2019 Document Reviewed: 11/10/2019 Elsevier Patient Education  [place].

## 2021-07-14 DIAGNOSIS — M25471 Effusion, right ankle: Secondary | ICD-10-CM | POA: Insufficient documentation

## 2021-07-14 NOTE — Assessment & Plan Note (Addendum)
BP elevated with improvement on repeat check  Patient has had issues with hypotension and dizziness in the past so in setting of elevated BP, will recommend patient for ambulatory blood pressure monitoring  Continue coreg & irbesartan  F/u in 2-3 weeks

## 2021-07-14 NOTE — Assessment & Plan Note (Signed)
Ankle swelling without tenderness and no red flag findings on exam  Recommended keeping foot elevated to hlp decrease swelling  Recommended Tylenol if she begins to have pain  Will send for right foot/ankle xray to assess for potential arthritis, low suspicion for fracture given denial of trauma to ankle

## 2021-07-22 ENCOUNTER — Ambulatory Visit: Payer: Medicare Other | Admitting: Pharmacist

## 2021-07-23 ENCOUNTER — Ambulatory Visit: Payer: Medicare Other | Admitting: Pharmacist

## 2021-07-25 DIAGNOSIS — Z85828 Personal history of other malignant neoplasm of skin: Secondary | ICD-10-CM | POA: Diagnosis not present

## 2021-07-25 DIAGNOSIS — L821 Other seborrheic keratosis: Secondary | ICD-10-CM | POA: Diagnosis not present

## 2021-07-25 DIAGNOSIS — L905 Scar conditions and fibrosis of skin: Secondary | ICD-10-CM | POA: Diagnosis not present

## 2021-07-25 DIAGNOSIS — L720 Epidermal cyst: Secondary | ICD-10-CM | POA: Diagnosis not present

## 2021-07-25 DIAGNOSIS — L814 Other melanin hyperpigmentation: Secondary | ICD-10-CM | POA: Diagnosis not present

## 2021-07-25 DIAGNOSIS — D1801 Hemangioma of skin and subcutaneous tissue: Secondary | ICD-10-CM | POA: Diagnosis not present

## 2021-08-01 ENCOUNTER — Ambulatory Visit (INDEPENDENT_AMBULATORY_CARE_PROVIDER_SITE_OTHER): Payer: Medicare Other | Admitting: Pharmacist

## 2021-08-01 ENCOUNTER — Encounter: Payer: Self-pay | Admitting: Pharmacist

## 2021-08-01 DIAGNOSIS — I10 Essential (primary) hypertension: Secondary | ICD-10-CM

## 2021-08-01 NOTE — Progress Notes (Signed)
S:     Chief Complaint  Patient presents with   Medication Management    Ambulatory BP Monitor   Melissa Gibbs is a 81 y.o. female who presents for hypertension valuation, education, and management. PMH is significant for HTN, CAD (STEMI), HLD, ICM, PAD, afib. Patient was referred and last seen by Primary Care Provider, Dr. Alba Cory, on 07/10/21. At last visit, BP was initially 195/79 and improved to 169/80. She has had hypotension and dizziness in the past so she was referred for ambulatory BP monitoring prior to medication adjustment.   BP this morning 180/106, 5 min later 162/98. Had not yet taken her medications. She often forgets to take her BP later in the day after taking her medications. Brings log of home BP readings: range from 121/68 to 181/101. Very variable readings in that range. HR ranges 59 to 79 with most readings in the 60s.   Medication compliance is reported to be good.  Discussed procedure for wearing the monitor and gave patient written instructions. Monitor was placed on non-dominant arm with instructions to return in the morning.   Current BP Medications include: irbesartan 150 mg daily, carvedilol 6.25 mg BID  Antihypertensives tried in the past include: lisinopril, valsartan, losartan (now on irbesartan)  Dietary habits include: Had cup of coffee and piece of toast this morning  Day #2 - Patient returns to clinic and reports no issues with amb BP monitor. Patient reports they were able to wear the Ambulatory Blood Pressure Cuff for the entire 24 evaluation period.    O:  Review of Systems  All other systems reviewed and are negative.  Physical Exam Constitutional:      Appearance: Normal appearance.  Pulmonary:     Effort: Pulmonary effort is normal.  Neurological:     Mental Status: She is alert.  Psychiatric:        Mood and Affect: Mood normal.        Behavior: Behavior normal.        Thought Content: Thought content normal.    Last 3  Office BP readings: BP Readings from Last 3 Encounters:  07/10/21 (!) 169/80  01/02/21 137/81  12/20/20 (!) 164/82   Clinical Atherosclerotic Cardiovascular Disease (ASCVD): Yes  The ASCVD Risk score (Arnett DK, et al., 2019) failed to calculate for the following reasons:   The 2019 ASCVD risk score is only valid for ages 16 to 36   The patient has a prior MI or stroke diagnosis  Basic Metabolic Panel    Component Value Date/Time   NA 141 09/03/2020 0758   K 4.5 09/03/2020 0758   CL 104 09/03/2020 0758   CO2 24 09/03/2020 0758   GLUCOSE 95 09/03/2020 0758   GLUCOSE 114 (H) 07/08/2020 2244   BUN 15 09/03/2020 0758   CREATININE 0.94 09/03/2020 0758   CREATININE 0.82 11/07/2015 0937   CALCIUM 9.1 09/03/2020 0758   GFRNONAA >60 07/08/2020 2244   GFRNONAA 70 11/07/2015 0937   GFRAA 70 06/21/2019 0753   GFRAA 81 11/07/2015 0937   Renal function: CrCl cannot be calculated (Patient's most recent lab result is older than the maximum 21 days allowed.).  ABPM Study Data: Arm Placement left arm  Overall Mean 24hr BP:   149/76 mmHg  HR: 61  Daytime Mean BP:  147/77 mmHg  HR: 64  Nighttime Mean BP:  155/73 mmHg  HR: 56  Dipping Pattern: No.  Sys:   -5%   Dia: 5%   [normal  dipping ~10-20%]  For Office Goal Goal BP of <130/80:  Non-hypertensive ABPM thresholds: daytime BP <125/75 mmHg, sleeptime BP <120/70 mmHg    A/P:  History of hypertension since 2008 found to have isolated systolic hypertension. 24-hour ambulatory blood pressure demonstrates wide pulse pressure with an average awake blood pressure of 147/77 mmHg and pulse of 64. Nocturnal dipping pattern is abnormal  +/-5% .   -Changes to medications None today.  -Consider dose increase of irbesartan from '150mg'$  to '300mg'$  in the future.  - It may be appropriate to lower carvedilol dose from 6.'25mg'$  to 3.'125mg'$  at the same time to allow additional Cardiac Output adaptation by increasing heart rate slightly.  Patient is seeing PCP,  Dr. Alba Cory in 3 days (Monday) and I asked the patient to focus on any episodes of dizziness with her current medications over the weekend.  We discussed how summer temperatures may increase the risk for dizziness when dehydrated.  We discussed maintaining hydration with changes in the seasons.  She verbalized understanding of need to avoid dehydration.   Results reviewed and written information provided.    Written patient instructions provided. Patient verbalized understanding of treatment plan.  Total time in face to face counseling 29 minutes.    Follow-up:  Pharmacist PRN. PCP clinic visit Monday 7/3.  Patient seen with Berdie Ogren, PharmD Candidate, and Rebbeca Paul, PharmD, PGY2 Pharmacy Resident.

## 2021-08-01 NOTE — Patient Instructions (Addendum)
Blood Pressure Activity Diary Time Lying down/ Sleeping Walking/ Exercise Stressed/ Angry Headache/ Pain Dizzy  9 AM       10 AM       11 AM       12 PM       1 PM       2 PM       Time Lying down/ Sleeping Walking/ Exercise Stressed/ Angry Headache/ Pain Dizzy  3 PM       4 PM        5 PM       6 PM       7 PM       8 PM       Time Lying down/ Sleeping Walking/ Exercise Stressed/ Angry Headache/ Pain Dizzy  9 PM       10 PM       11 PM       12 AM       1 AM       2 AM       3 AM       Time Lying down/ Sleeping Walking/ Exercise Stressed/ Angry Headache/ Pain Dizzy  4 AM       5 AM       6 AM       7 AM       8 AM       9 AM       10 AM        Time you woke up: _________                  Time you went to sleep:__________  Come back tomorrow at 9:00am to have the monitor removed Call the [facility] if you have any questions before then [phone])  Wearing the Blood Pressure Monitor The cuff will inflate every 20 minutes during the day and every 30 minutes while you sleep. Your blood pressure readings will NOT display after cuff inflation Fill out the blood pressure-activity diary during the day, especially during activities that may affect your reading -- such as exercise, stress, walking, taking your blood pressure medications  Important things to know: Avoid taking the monitor off for the next 24 hours, unless it causes you discomfort or pain. Do NOT get the monitor wet and do NOT dry to clean the monitor with any cleaning products. Do NOT put the monitor on anyone else's arm. When the cuff inflates, avoid excess movement. Let the cuffed arm hang loosely, slightly away from the body. Avoid flexing the muscles or moving the hand/fingers. When you go to sleep, make sure that the hose is not kinked. Remember to fill out the blood pressure activity diary. If you experience severe pain or unusual pain (not associated with getting your blood pressure  checked), remove the monitor.  Troubleshooting:  Code  Troubleshooting   1  Check cuff position, tighten cuff   2, 3  Remain still during reading   4, 87  Check air hose connections and make sure cuff is tight   85, 89  Check hose connections and make tubing is not crimped   86  Push START/STOP to restart reading   88, 91  Retry by pushing START/STOP   90  Replace batteries. If problem persists, remove monitor and bring back to   clinic at follow up   97, 98, 99  Service required - Remove monitor and bring back to clinic at  follow up    Day #2 No change in BP Medications today.  Follow-up with Dr. Alba Cory on Monday.

## 2021-08-02 ENCOUNTER — Encounter: Payer: Self-pay | Admitting: Pharmacist

## 2021-08-02 NOTE — Progress Notes (Signed)
Reviewed: I agree with Dr. Koval's documentation and management. 

## 2021-08-02 NOTE — Assessment & Plan Note (Signed)
History of hypertension since 2008 found to have isolated systolic hypertension. 24-hour ambulatory blood pressure demonstrates wide pulse pressure with an average awake blood pressure of 147/77 mmHg and pulse of 64. Nocturnal dipping pattern is abnormal  +/-5%.   -Changes to medications None today.  -Consider dose increase of irbesartan from '150mg'$  to '300mg'$  in the future.  - It may be appropriate to lower carvedilol dose from 6.'25mg'$  to 3.'125mg'$  at the same time to allow additional Cardiac Output adaptation by increasing heart rate slightly.  Patient is seeing PCP, Dr. Alba Cory in 3 days (Monday) and I asked the patient to focus on any episodes of dizziness with her current medications over the weekend.  We discussed how summer temperatures may increase the risk for dizziness when dehydrated.  We discussed maintaining hydration with changes in the seasons.  She verbalized understanding of need to avoid dehydration.   Results reviewed and written information provided.

## 2021-08-04 NOTE — Progress Notes (Signed)
    SUBJECTIVE:   CHIEF COMPLAINT / HPI: HTN f/u   Patient presents for follow up for HTN  She underwent ambulatory blood pressure monitoring and was found to have isolated systolic HTN  She was recommended to continue current regimen including irbeszartan '150mg'$  and coreg 6.'25mg'$  BID with consideration of increasing ARB and decreasing BB  She was asked to look for signs of dizziness over the weekend   Today she reports that she normally tries to take her blood pressure medications in the AM  She does not check her blood pressure at home  She reports sometimes having HA  She denies leg swelling but did have some in the past that has resolved  She denies vision changes     PERTINENT  PMH / PSH:  HTN  STEMI  CAD PAD  OBJECTIVE:   BP (!) 176/84   Pulse 61   Ht '5\' 2"'$  (1.575 m)   Wt 160 lb 6.4 oz (72.8 kg)   SpO2 96%   BMI 29.34 kg/m   Physical Exam Constitutional:      Appearance: She is normal weight.  HENT:     Right Ear: External ear normal.     Left Ear: External ear normal.     Nose: Nose normal.  Eyes:     Conjunctiva/sclera: Conjunctivae normal.  Cardiovascular:     Rate and Rhythm: Normal rate and regular rhythm.     Pulses: Normal pulses.     Heart sounds: Normal heart sounds.  Pulmonary:     Effort: Pulmonary effort is normal.     Breath sounds: Normal breath sounds. No wheezing, rhonchi or rales.  Musculoskeletal:        General: No swelling, tenderness or deformity.     Cervical back: Normal range of motion.     Right lower leg: No edema.     Left lower leg: No edema.  Skin:    General: Skin is warm.  Neurological:     General: No focal deficit present.     Mental Status: She is alert and oriented to person, place, and time.    ASSESSMENT/PLAN:   Hypertension Patient evaluated with ambulatory blood pressure monitoring found to have isolated systolic hypertension Blood pressure is elevated today at 176/84 Patient reports adherence to irbesartan  150 mg daily and Coreg 6.25 mg twice daily We will increase irbesartan to 300 mg daily, patient reports having an adequate supply of irbesartan F/u in 1 week for BP check  Will collect BMP today      Eulis Foster, MD [place]

## 2021-08-05 ENCOUNTER — Ambulatory Visit: Payer: Medicare Other | Admitting: Family Medicine

## 2021-08-05 ENCOUNTER — Encounter: Payer: Self-pay | Admitting: Family Medicine

## 2021-08-05 VITALS — BP 176/84 | HR 61 | Ht 62.0 in | Wt 160.4 lb

## 2021-08-05 DIAGNOSIS — I10 Essential (primary) hypertension: Secondary | ICD-10-CM | POA: Diagnosis not present

## 2021-08-05 MED ORDER — IRBESARTAN 300 MG PO TABS: 150.0000 mg | ORAL_TABLET | Freq: Every day | ORAL | 2 refills | Status: DC

## 2021-08-05 MED ORDER — IRBESARTAN 300 MG PO TABS: 300.0000 mg | ORAL_TABLET | Freq: Every day | ORAL | 2 refills | Status: DC

## 2021-08-05 NOTE — Patient Instructions (Signed)
Your blood pressure remains elevated today  I would like for you to start taking 300 mg of irbesartan daily (please start today) and plan to follow-up with our office next week for repeat blood pressure check  Please notify us if you experience increased dizziness after changing her blood pressure medications  Our goal is to have your blood pressure less than 150/90  Today we will also check your kidney function and electrolytes.  I will notify you of any abnormal results

## 2021-08-05 NOTE — Assessment & Plan Note (Addendum)
Patient evaluated with ambulatory blood pressure monitoring found to have isolated systolic hypertension Blood pressure is elevated today at 176/84 Patient reports adherence to irbesartan 150 mg daily and Coreg 6.25 mg twice daily We will increase irbesartan to 300 mg daily, patient reports having an adequate supply of irbesartan F/u in 1 week for BP check  Will collect BMP today

## 2021-08-06 LAB — BASIC METABOLIC PANEL
BUN/Creatinine Ratio: 17 (ref 12–28)
BUN: 15 mg/dL (ref 8–27)
CO2: 25 mmol/L (ref 20–29)
Calcium: 9.2 mg/dL (ref 8.7–10.3)
Chloride: 102 mmol/L (ref 96–106)
Creatinine, Ser: 0.86 mg/dL (ref 0.57–1.00)
Glucose: 96 mg/dL (ref 70–99)
Potassium: 4.1 mmol/L (ref 3.5–5.2)
Sodium: 140 mmol/L (ref 134–144)
eGFR: 68 mL/min/{1.73_m2} (ref >59)

## 2021-08-12 ENCOUNTER — Telehealth: Payer: Self-pay

## 2021-08-12 NOTE — Telephone Encounter (Signed)
Patient calls nurse line regarding medication management of irbesartan. Patient reports that she just received new prescription refill of irbesartan, however, it was still for the 150 mg. Patient reports that she has been taking the 150 mg twice daily, as she prefers this over taking 300 mg at the same time.   Patient states that she was told to come back for BP follow up this week. Patient states that she would prefer to manage from home and avoid another visit if possible. Patient will record blood pressures twice daily and keep record and send through mychart later this week. If further management is needed, patient will then schedule an appointment.   FYI to PCP.   Talbot Grumbling, RN

## 2021-08-23 ENCOUNTER — Encounter: Payer: Self-pay | Admitting: Student

## 2021-08-23 NOTE — Telephone Encounter (Signed)
Patient calls again in regards to BP.  Patient reports her readings have been "all over the place, some good some bad." Patient is continuing to take irbesartan '150mg'$  BID. Patient does not want to come in for an apt due to "many apts and cost."   Patient did not have her readings in front of her and stated she will send a mychart message to PCP with recent BP readings.   Patient denies any SOB, headaches or vision changes.   Red flags discussed with patient.

## 2021-09-03 ENCOUNTER — Encounter: Payer: Self-pay | Admitting: Obstetrics and Gynecology

## 2021-09-03 ENCOUNTER — Ambulatory Visit: Payer: Medicare Other | Admitting: Obstetrics and Gynecology

## 2021-09-03 VITALS — BP 174/98 | HR 71 | Ht 59.75 in | Wt 162.0 lb

## 2021-09-03 DIAGNOSIS — N8110 Cystocele, unspecified: Secondary | ICD-10-CM | POA: Diagnosis not present

## 2021-09-03 DIAGNOSIS — R3915 Urgency of urination: Secondary | ICD-10-CM

## 2021-09-03 DIAGNOSIS — N393 Stress incontinence (female) (male): Secondary | ICD-10-CM | POA: Diagnosis not present

## 2021-09-03 DIAGNOSIS — R159 Full incontinence of feces: Secondary | ICD-10-CM

## 2021-09-03 DIAGNOSIS — R350 Frequency of micturition: Secondary | ICD-10-CM | POA: Diagnosis not present

## 2021-09-03 LAB — POCT URINALYSIS DIPSTICK
Bilirubin, UA: NEGATIVE
Glucose, UA: NEGATIVE
Ketones, UA: NEGATIVE
Nitrite, UA: NEGATIVE
Protein, UA: POSITIVE — AB
Spec Grav, UA: >=1.03 — AB (ref 1.010–1.025)
Urobilinogen, UA: 0.2 E.U./dL
pH, UA: 5.5 (ref 5.0–8.0)

## 2021-09-03 NOTE — Progress Notes (Signed)
[facility] New Patient Evaluation and Consultation  Referring Provider: Donnal Moat* PCP: Precious Gilding, DO Date of Service: 09/03/2021  SUBJECTIVE Chief Complaint: New Patient (Initial Visit) Melissa Gibbs is a 81 y.o. female here for a prolapse evaluation.)  History of Present Illness: Melissa Gibbs is a 81 y.o. White or Caucasian female presenting for evaluation of prolapse.     Urinary Symptoms: Leaks urine with cough/ sneeze, with movement to the bathroom, and with urgency Leaks occasionally Pad use: none She is not bothered by her UI symptoms.  Day time voids every few hours.  Nocturia: 2-3 times per night to void. Voiding dysfunction: she empties her bladder well.  does not use a catheter to empty bladder.  When urinating, she feels a weak stream Drinks: 1 cup coffee in AM, water. Does not drink soda, has an occasional tea.   UTIs:  0  UTI's in the last year.   Denies history of blood in urine and kidney or bladder stones  Pelvic Organ Prolapse Symptoms:                  She Admits to a feeling of a bulge the vaginal area. In June last year, she had worsening pain. Was seen at [facility] and a the pessary placed.  Currently has a pessary in place- not sure when the last time was that it was cleaned.  Uses estrogen cream when she remembers but not regularly.   Bowel Symptom: Bowel movements: 1 time(s) per day Stool consistency: loose Straining: no.  Splinting: no.  Incomplete evacuation: no.  She Admits to accidental bowel leakage / fecal incontinence  Occurs: only if her stool is really loose Bowel regimen: none Last colonoscopy: 2009  Sexual Function Sexually active: no.   Pelvic Pain Denies pelvic pain   Past Medical History:  Past Medical History:  Diagnosis Date   ACS (acute coronary syndrome) ([place]) 11/20/2017   CAD (coronary artery disease) 11/20/2017   a. Dx STEMI on 11/20/2017 s/p staged PCI with DES to pLCx and DES  to mid LAD   Cataract    Hyperlipidemia    Hypertension    Ischemic cardiomyopathy    a. Echo 11/22/2017 w/ LVEF of 35-40%, hypokinesis of basal and mid inferolateral, inferior, and apical inferior and lateral walls, and G1DD   Myocardial infarction [facility])    PAD (peripheral artery disease) ([place])    a. Right radial artery noted to be occluded during heart catheterization in 11/2017.   Paroxysmal atrial fibrillation in setting of acute MI    a. Brief episode of atrial fibrillation in setting of acute STEMI - no chronic anticoagulation needed unless recurrent episodes   Shingles 2000   R flank, mild, self reported, did not present to care    Skin cancer      Past Surgical History:   Past Surgical History:  Procedure Laterality Date   CATARACT EXTRACTION, BILATERAL     CORONARY STENT INTERVENTION N/A 11/20/2017   Procedure: CORONARY STENT INTERVENTION;  Surgeon: Sherren Mocha, MD;  Location: [facility];  Service: Cardiovascular;  Laterality: N/A;   CORONARY STENT INTERVENTION N/A 11/23/2017   Procedure: CORONARY STENT INTERVENTION;  Surgeon: Burnell Blanks, MD;  Location: [facility];  Service: Cardiovascular;  Laterality: N/A;   CORONARY/GRAFT ACUTE MI REVASCULARIZATION N/A 11/20/2017   Procedure: Coronary/Graft Acute MI Revascularization;  Surgeon: Sherren Mocha, MD;  Location: [facility];  Service: Cardiovascular;  Laterality: N/A;  LEFT HEART CATH AND CORONARY ANGIOGRAPHY N/A 11/20/2017   Procedure: LEFT HEART CATH AND CORONARY ANGIOGRAPHY;  Surgeon: Sherren Mocha, MD;  Location: [facility];  Service: Cardiovascular;  Laterality: N/A;   SKIN CANCER EXCISION  2014   Sees [facility] regularly   VAGINAL HYSTERECTOMY     fibroids      Past OB/GYN History: OB History  Gravida Para Term Preterm AB Living  4            SAB IAB Ectopic Multiple Live Births          4    # Outcome Date GA Lbr Len/2nd Weight Sex Delivery Anes  PTL Lv  4 Gravida           3 Gravida           2 Gravida           1 Gravida             Vaginal deliveries: 4,  Forceps/ Vacuum deliveries: 0 3 children have been adopted S/p hysterectomy   Medications: She has a current medication list which includes the following prescription(s): alendronate, atorvastatin, carvedilol, cholecalciferol, clopidogrel, estradiol, irbesartan, and nitroglycerin.   Allergies: Patient is allergic to amoxicillin.   Social History:  Social History   Tobacco Use   Smoking status: Former    Types: Cigarettes   Smokeless tobacco: Never   Tobacco comments:    quit smoking at age 67  Vaping Use   Vaping Use: Never used  Substance Use Topics   Alcohol use: Yes    Comment: very rarely drinks a wine cooler   Drug use: No    Relationship status: divorced She lives alone She is not employed. Regular exercise: Yes:   History of abuse: No  Family History:   Family History  Problem Relation Age of Onset   Heart disease Mother 62       MI , rheumatic fever as a child, and heart murmur   Cancer Father        knee cancer   Colon cancer Father    Hypertension Father    Cancer Sister        melanoma - died at 55   Melanoma Sister    Heart disease Brother    Melanoma Other        died at age 61   Cancer Sister    Lung cancer Sister    Stroke Neg Hx      Review of Systems: Review of Systems  Constitutional:  Positive for malaise/fatigue. Negative for fever and weight loss.  Respiratory:  Negative for cough, shortness of breath and wheezing.   Cardiovascular:  Negative for chest pain, palpitations and leg swelling.  Gastrointestinal:  Negative for abdominal pain and blood in stool.  Genitourinary:  Negative for dysuria.  Musculoskeletal:  Negative for myalgias.  Skin:  Negative for rash.  Neurological:  Positive for dizziness. Negative for headaches.  Endo/Heme/Allergies:  Bruises/bleeds easily.  Psychiatric/Behavioral:  Negative for  depression. The patient is nervous/anxious.      OBJECTIVE Physical Exam: Vitals:   09/03/21 0958  BP: (!) 174/98  Pulse: 71  Weight: 162 lb (73.5 kg)  Height: 4' 11.75" (1.518 m)    Physical Exam Constitutional:      General: She is not in acute distress. Pulmonary:     Effort: Pulmonary effort is normal.  Abdominal:     General: There is no distension.  Palpations: Abdomen is soft.     Tenderness: There is no abdominal tenderness. There is no rebound.  Musculoskeletal:        General: No swelling. Normal range of motion.  Skin:    General: Skin is warm and dry.     Findings: No rash.  Neurological:     Mental Status: She is alert and oriented to person, place, and time.  Psychiatric:        Mood and Affect: Mood normal.        Behavior: Behavior normal.      GU / Detailed Urogynecologic Evaluation:  Pelvic Exam: Normal external female genitalia; Bartholin's and Skene's glands normal in appearance; urethral meatus normal in appearance, no urethral masses or discharge.   CST: negative Gellhorn pessary removed.  s/p hysterectomy: Speculum exam reveals normal vaginal mucosa with  atrophy and normal vaginal cuff.  Adnexa no mass, fullness, tenderness.     Pelvic floor strength I/V  Pelvic floor musculature: Right levator non-tender, Right obturator non-tender, Left levator non-tender, Left obturator non-tender  POP-Q:   POP-Q  0                                            Aa   0                                           Ba  -7                                              C   4                                            Gh  4                                            Pb  8.5                                            tvl   -1                                            Ap  -1                                            Bp  D   Gellhorn pessary replaced.   Rectal Exam:  Normal sphincter tone, small  distal rectocele, enterocoele not present, no rectal masses, no sign of dyssynergia when asking the patient to bear down.  Post-Void Residual (PVR) by Bladder Scan: In order to evaluate bladder emptying, we discussed obtaining a postvoid residual and she agreed to this procedure.  Procedure: The ultrasound unit was placed on the patient's abdomen in the suprapubic region after the patient had voided. A PVR of 6 ml was obtained by bladder scan.  Laboratory Results: POC urine: positive protein, trace leukocytes   ASSESSMENT AND PLAN Ms. Gossen is a 81 y.o. with:  1. Prolapse of anterior vaginal wall   2. Urinary frequency   3. Urinary urgency   4. SUI (stress urinary incontinence, female)   5. Incontinence of feces, unspecified fecal incontinence type    Stage II anterior, Stage I posterior, Stage I apical prolapse - Patient has pessary in place, so discussed that prolapse may be more advanced if she were to leave the pessary out.  - She has been happy with the pessary and would like to continue. Will have her return for cleanings q3 months  2. mixed incontinence - She is not interested in surgical procedures or medication.  - Reviewed option of pelvic physical therapy and referral placed  3. Fecal incontinence - Treatment options include anti-diarrhea medication (loperamide/ Imodium OTC or prescription lomotil), fiber supplements, physical therapy, and possible sacral neuromodulation or surgery.   - She will work on adding fiber supplementation for stool bulking  Return 3 months  Jaquita Folds, MD

## 2021-09-03 NOTE — Patient Instructions (Signed)
Accidental Bowel Leakage: Our goal is to achieve formed bowel movements daily or every-other-day without leakage.  You may need to try different combinations of the following options to find what works best for you.  Some management options include: Dietary changes (more leafy greens, vegetables and fruits; less processed foods) Fiber supplementation (Metamucil or something with psyllium as active ingredient) Over-the-counter imodium (tablets or liquid) to help solidify the stool and prevent leakage of stool.

## 2021-09-10 ENCOUNTER — Other Ambulatory Visit (HOSPITAL_COMMUNITY): Payer: Self-pay

## 2021-09-10 DIAGNOSIS — L720 Epidermal cyst: Secondary | ICD-10-CM | POA: Diagnosis not present

## 2021-09-10 DIAGNOSIS — D485 Neoplasm of uncertain behavior of skin: Secondary | ICD-10-CM | POA: Diagnosis not present

## 2021-09-10 MED ORDER — DOXYCYCLINE HYCLATE 100 MG PO TABS
100.0000 mg | ORAL_TABLET | Freq: Two times a day (BID) | ORAL | 0 refills | Status: DC
  Filled 2021-09-10: qty 14, 7d supply, fill #0

## 2021-09-11 ENCOUNTER — Other Ambulatory Visit (HOSPITAL_COMMUNITY): Payer: Self-pay

## 2021-09-12 NOTE — Progress Notes (Signed)
Cardiology Office Note:    Date:  09/13/2021   ID:  Melissa Gibbs, DOB 1940/10/04, MRN [id number]  PCP:  Precious Gilding, [place] [facility] Cardiologist:  Sherren Mocha, MD Cardiology APP:  [facility]     Referring MD: Donnal Moat*   Chief Complaint:  F/u for CAD    Patient Profile: Coronary artery disease Inferoposterior STEMI 10/19: DES to the LCx; staged DES to the LAD Complicated by atrial fibrillation Ischemic cardiomyopathy EF 35-40 Echocardiogram 03/2018: EF 60-65 Hypertension Hyperlipidemia Peripheral Arterial Disease    Prior CV Studies: Echocardiogram 04/01/2018 EF 60-65, mild LVH, grade 1 diastolic dysfunction, mild aortic valve sclerosis, trivial AI   Percutaneous coronary intervention 11/23/2017 PCI: 2.75 x 18 mm Sierra DES to mid LAD   Echocardiogram 11/22/2017 EF 35-40, inferolateral/inferior/apical inferior/lateral HK, grade 1 diastolic dysfunction, mild AI, trivial MR mild LAE, mild TR, mild PI, PASP 33   Cardiac catheterization 11/20/2017 LAD proximal 80, mid-distal 80 LCx ostial 100 RCA proximal 60 EF 45, mid inferior and inferoapical akinesis PCI: 4 x 15 mm Sierra DES to the ostial LCx  History of Present Illness:   Melissa Gibbs is a 81 y.o. female with the above problem list.  She was last seen in Aug 2022. She returns for Cardiology f/u.  She is here alone. She is doing well without chest pain, shortness of breath, syncope, orthopnea, leg edema.         Past Medical History:  Diagnosis Date   ACS (acute coronary syndrome) ([place]) 11/20/2017   CAD (coronary artery disease) 11/20/2017   a. Dx STEMI on 11/20/2017 s/p staged PCI with DES to pLCx and DES to mid LAD   Cataract    Hyperlipidemia    Hypertension    Ischemic cardiomyopathy    a. Echo 11/22/2017 w/ LVEF of 35-40%, hypokinesis of basal and mid inferolateral, inferior, and apical inferior and lateral walls, and G1DD   Myocardial infarction [facility])     PAD (peripheral artery disease) ([place])    a. Right radial artery noted to be occluded during heart catheterization in 11/2017.   Paroxysmal atrial fibrillation in setting of acute MI    a. Brief episode of atrial fibrillation in setting of acute STEMI - no chronic anticoagulation needed unless recurrent episodes   Shingles 2000   R flank, mild, self reported, did not present to care    Skin cancer    Current Medications: Current Meds  Medication Sig   alendronate (FOSAMAX) 10 MG tablet TAKE 1 TABLET BY MOUTH  DAILY BEFORE BREAKFAST WITH A FULL GLASS OF WATER ON AN EMPTY STOMACH   atorvastatin (LIPITOR) 80 MG tablet TAKE 1 TABLET BY MOUTH  DAILY AT 6 PM   carvedilol (COREG) 6.25 MG tablet TAKE 1 TABLET BY MOUTH  TWICE DAILY WITH MEALS   Cholecalciferol 10 MCG (400 UNIT) CAPS Take 2 capsules (800 Units total) by mouth daily.   clopidogrel (PLAVIX) 75 MG tablet TAKE 1 TABLET BY MOUTH  DAILY   doxycycline (VIBRA-TABS) 100 MG tablet Take 1 tablet (100 mg total) by mouth 2 (two) times daily.   estradiol (ESTRACE VAGINAL) 0.1 MG/GM vaginal cream Apply 1/2 inch nightly for 2 weeks followed by twice a week thereafter   irbesartan (AVAPRO) 300 MG tablet Take 1 tablet (300 mg total) by mouth daily.   nitroGLYCERIN (NITROSTAT) 0.4 MG SL tablet Place 1 tablet (0.4 mg total) under the tongue every 5 (five) minutes for 3 doses as  needed for chest pain.    Allergies:   Amoxicillin   Social History   Tobacco Use   Smoking status: Former    Types: Cigarettes   Smokeless tobacco: Never   Tobacco comments:    quit smoking at age 56  Vaping Use   Vaping Use: Never used  Substance Use Topics   Alcohol use: Yes    Comment: very rarely drinks a wine cooler   Drug use: No    Family Hx: The patient's family history includes Cancer in her father, sister, and sister; Colon cancer in her father; Heart disease in her brother; Heart disease (age of onset: 48) in her mother; Hypertension in her father; Lung  cancer in her sister; Melanoma in her sister and another family member. There is no history of Stroke.  Review of Systems  Gastrointestinal:  Negative for hematochezia.  Genitourinary:  Negative for hematuria.     EKGs/Labs/Other Test Reviewed:    EKG:  EKG is   ordered today.  The ekg ordered today demonstrates NSR, HR 61, PRWP, QTc 414 ms, no changes  Recent Labs: 10/25/2020: Hemoglobin 12.1; Platelets 288; TSH 1.110 08/05/2021: BUN 15; Creatinine, Ser 0.86; Potassium 4.1; Sodium 140   Recent Lipid Panel No results for input(s): "CHOL", "TRIG", "HDL", "VLDL", "LDLCALC", "LDLDIRECT" in the last 8760 hours.   Risk Assessment/Calculations/Metrics:              Physical Exam:    VS:  BP 124/78   Pulse 61   Ht 5' (1.524 m)   Wt 160 lb 6.4 oz (72.8 kg)   SpO2 96%   BMI 31.33 kg/m     Wt Readings from Last 3 Encounters:  09/13/21 160 lb 6.4 oz (72.8 kg)  09/03/21 162 lb (73.5 kg)  08/05/21 160 lb 6.4 oz (72.8 kg)    Constitutional:      Appearance: Healthy appearance. Not in distress.  Neck:     Vascular: No carotid bruit. JVD normal.  Pulmonary:     Effort: Pulmonary effort is normal.     Breath sounds: No wheezing. No rales.  Cardiovascular:     Normal rate. Regular rhythm. Normal S1. Normal S2.      Murmurs: There is no murmur.  Edema:    Peripheral edema absent.  Abdominal:     Palpations: Abdomen is soft.  Skin:    General: Skin is warm and dry.  Neurological:     Mental Status: Alert and oriented to person, place and time.         ASSESSMENT & PLAN:   Coronary artery disease involving native coronary artery of native heart without angina pectoris S/p inferoposterior STEMI in 2019 tx with DES to LCx and staged DES to LAD. EF was down to 35-40 but recovered to normal by echocardiogram in 03/2018. She is doing well without angina. Continue Plavix 75 mg once daily, Lipitor 80 mg once daily, coreg 6.25 mg twice daily. F/u 1 year.   Essential hypertension BP  controlled. Continue coreg 6.25 mg twice daily, Irbesartan 300 mg once daily. Recent SCr, K+ normal.   Hyperlipidemia LDL goal <70 Continue Lipitor 80 mg once daily. She is not fasting today. Check direct LDL and LFTs.            Dispo:  Return in about 1 year (around 09/14/2022) for Routine Follow Up, w/ Dr. Burt Knack, or Richardson Dopp, PA-C.   Medication Adjustments/Labs and Tests Ordered: Current medicines are reviewed at length with the  patient today.  Concerns regarding medicines are outlined above.  Tests Ordered: Orders Placed This Encounter  Procedures   LDL cholesterol, direct   Hepatic function panel   EKG 12-Lead   Medication Changes: No orders of the defined types were placed in this encounter.  Signed, Richardson Dopp, PA-C  09/13/2021 10:00 AM    [facility] [place], [place], [place]  [address] Phone: [phone]; Fax: [phone]

## 2021-09-13 ENCOUNTER — Ambulatory Visit: Payer: Medicare Other | Admitting: Physician Assistant

## 2021-09-13 ENCOUNTER — Encounter: Payer: Self-pay | Admitting: Physician Assistant

## 2021-09-13 VITALS — BP 124/78 | HR 61 | Ht 60.0 in | Wt 160.4 lb

## 2021-09-13 DIAGNOSIS — E782 Mixed hyperlipidemia: Secondary | ICD-10-CM | POA: Diagnosis not present

## 2021-09-13 DIAGNOSIS — I10 Essential (primary) hypertension: Secondary | ICD-10-CM

## 2021-09-13 DIAGNOSIS — E785 Hyperlipidemia, unspecified: Secondary | ICD-10-CM | POA: Diagnosis not present

## 2021-09-13 DIAGNOSIS — I2510 Atherosclerotic heart disease of native coronary artery without angina pectoris: Secondary | ICD-10-CM

## 2021-09-13 LAB — HEPATIC FUNCTION PANEL
ALT: 6 IU/L (ref 0–32)
AST: 15 IU/L (ref 0–40)
Albumin: 4.4 g/dL (ref 3.7–4.7)
Alkaline Phosphatase: 74 IU/L (ref 44–121)
Bilirubin Total: 0.4 mg/dL (ref 0.0–1.2)
Bilirubin, Direct: 0.13 mg/dL (ref 0.00–0.40)
Total Protein: 7.2 g/dL (ref 6.0–8.5)

## 2021-09-13 LAB — LDL CHOLESTEROL, DIRECT: LDL Direct: 68 mg/dL (ref 0–99)

## 2021-09-13 NOTE — Assessment & Plan Note (Signed)
BP controlled. Continue coreg 6.25 mg twice daily, Irbesartan 300 mg once daily. Recent SCr, K+ normal.

## 2021-09-13 NOTE — Patient Instructions (Addendum)
Medication Instructions:  Your physician recommends that you continue on your current medications as directed. Please refer to the Current Medication list given to you today.  *If you need a refill on your cardiac medications before your next appointment, please call your pharmacy*   Lab Work: TODAY: DIRECT LDL, LFTS If you have labs (blood work) drawn today and your tests are completely normal, you will receive your results only by: [place] (if you have MyChart) OR A paper copy in the mail If you have any lab test that is abnormal or we need to change your treatment, we will call you to review the results.   Testing/Procedures: NONE   Follow-Up: At [facility], you and your health needs are our priority.  As part of our continuing mission to provide you with exceptional heart care, we have created designated Provider Care Teams.  These Care Teams include your primary Cardiologist (physician) and Advanced Practice Providers (APPs -  Physician Assistants and Nurse Practitioners) who all work together to provide you with the care you need, when you need it.  We recommend signing up for the patient portal called "MyChart".  Sign up information is provided on this After Visit Summary.  MyChart is used to connect with patients for Virtual Visits (Telemedicine).  Patients are able to view lab/test results, encounter notes, upcoming appointments, etc.  Non-urgent messages can be sent to your provider as well.   To learn more about what you can do with MyChart, go to [email or web address].    Your next appointment:   1 year(s)  The format for your next appointment:   In Person  Provider:   Richardson Dopp, [facility]  Important Information About Sugar

## 2021-09-13 NOTE — Assessment & Plan Note (Signed)
S/p inferoposterior STEMI in 2019 tx with DES to LCx and staged DES to LAD. EF was down to 35-40 but recovered to normal by echocardiogram in 03/2018. She is doing well without angina. Continue Plavix 75 mg once daily, Lipitor 80 mg once daily, coreg 6.25 mg twice daily. F/u 1 year.

## 2021-09-13 NOTE — Assessment & Plan Note (Signed)
Continue Lipitor 80 mg once daily. She is not fasting today. Check direct LDL and LFTs.

## 2021-09-18 ENCOUNTER — Telehealth: Payer: Self-pay

## 2021-09-18 NOTE — Telephone Encounter (Signed)
Patient calls nurse line in regards to irbesartan.   Patient reports she was increased to '300mg'$  daily in July, however her pharmacy sent her '300mg'$  tablets taking half.  Patient reports she has been taking '300mg'$  daily, however she is taking '150mg'$  am and '150mg'$  pm.   Patient reports her BPs are still "all over the place."   BP at cards was WNL on 8/11.  Patient is wondering if taking the full '300mg'$  would make a difference.   No symptoms present now. ED precaution given.   Will forward to PCP.

## 2021-09-19 NOTE — Progress Notes (Signed)
Pt has been made aware of normal result and verbalized understanding.  jw

## 2021-09-19 NOTE — Telephone Encounter (Signed)
Contacted patient.   Patient advised to take full '300mg'$  in one dose as prescribed.   Patient reports she will call for an apt after PT in September.

## 2021-09-24 ENCOUNTER — Other Ambulatory Visit: Payer: Self-pay | Admitting: Physician Assistant

## 2021-09-30 NOTE — Therapy (Unsigned)
OUTPATIENT PHYSICAL THERAPY FEMALE PELVIC EVALUATION   Patient Name: Melissa Gibbs MRN: [id number] DOB:[birth year], 81 y.o., female Today's Date: 10/01/2021   PT End of Session - 10/01/21 1013     Visit Number 1    Date for PT Re-Evaluation 12/24/21    Authorization Type [employer] medicare    Authorization - Visit Number 1    Authorization - Number of Visits 10    PT Start Time 0930    PT Stop Time 1017    PT Time Calculation (min) 45 min    Activity Tolerance Patient tolerated treatment well    Behavior During Therapy [facility] for tasks assessed/performed             Past Medical History:  Diagnosis Date   ACS (acute coronary syndrome) ([place]) 11/20/2017   CAD (coronary artery disease) 11/20/2017   a. Dx STEMI on 11/20/2017 s/p staged PCI with DES to pLCx and DES to mid LAD   Cataract    Hyperlipidemia    Hypertension    Ischemic cardiomyopathy    a. Echo 11/22/2017 w/ LVEF of 35-40%, hypokinesis of basal and mid inferolateral, inferior, and apical inferior and lateral walls, and G1DD   Myocardial infarction [facility])    PAD (peripheral artery disease) ([place])    a. Right radial artery noted to be occluded during heart catheterization in 11/2017.   Paroxysmal atrial fibrillation in setting of acute MI    a. Brief episode of atrial fibrillation in setting of acute STEMI - no chronic anticoagulation needed unless recurrent episodes   Shingles 2000   R flank, mild, self reported, did not present to care    Skin cancer    Past Surgical History:  Procedure Laterality Date   CATARACT EXTRACTION, BILATERAL     CORONARY STENT INTERVENTION N/A 11/20/2017   Procedure: CORONARY STENT INTERVENTION;  Surgeon: Sherren Mocha, MD;  Location: [place] CV LAB;  Service: Cardiovascular;  Laterality: N/A;   CORONARY STENT INTERVENTION N/A 11/23/2017   Procedure: CORONARY STENT INTERVENTION;  Surgeon: Burnell Blanks, MD;  Location: [place] CV LAB;  Service: Cardiovascular;  Laterality:  N/A;   CORONARY/GRAFT ACUTE MI REVASCULARIZATION N/A 11/20/2017   Procedure: Coronary/Graft Acute MI Revascularization;  Surgeon: Sherren Mocha, MD;  Location: [place] CV LAB;  Service: Cardiovascular;  Laterality: N/A;   LEFT HEART CATH AND CORONARY ANGIOGRAPHY N/A 11/20/2017   Procedure: LEFT HEART CATH AND CORONARY ANGIOGRAPHY;  Surgeon: Sherren Mocha, MD;  Location: [place] CV LAB;  Service: Cardiovascular;  Laterality: N/A;   SKIN CANCER EXCISION  2014   Sees [facility] Dermatology regularly   VAGINAL HYSTERECTOMY     fibroids    Patient Active Problem List   Diagnosis Date Noted   Pain and swelling of ankle, right 07/14/2021   Back pain 01/02/2021   Depressed mood 10/25/2020   Fatigue 10/25/2020   Urinary tract obstruction 07/18/2020   Tension headache 06/13/2020   Dizziness 09/14/2019   Elevated random blood glucose level 08/27/2019   Poison ivy dermatitis 05/13/2019   Eustachian tube disorder, bilateral 03/31/2018   Ischemic cardiomyopathy 11/24/2017   Paroxysmal atrial fibrillation ([place]) 11/24/2017   PAD (peripheral artery disease) ([place]) 11/24/2017   Coronary artery disease involving native coronary artery of native heart without angina pectoris    STEMI involving left circumflex coronary artery ([place])    Seasonal allergies 04/28/2017   Healthcare maintenance 11/07/2015   SK (seborrheic keratosis) 05/20/2013   Cystocele, grade 2 05/20/2013   Overweight (BMI 25.0-29.9)  05/20/2013   H/O nonmelanoma skin cancer 05/20/2013   UTI (urinary tract infection) 10/15/2011   Hyperlipidemia LDL goal <70 04/02/2006   Essential hypertension 04/02/2006    PCP: Precious Gilding, DO  REFERRING PROVIDER: Jaquita Folds, MD  REFERRING DIAG:  R39.15 (ICD-10-CM) - Urinary urgency  N39.3 (ICD-10-CM) - SUI (stress urinary incontinence, female)  R15.9 (ICD-10-CM) - Incontinence of feces, unspecified fecal incontinence type    THERAPY DIAG:  Muscle weakness  (generalized)  Other lack of coordination  Rationale for Evaluation and Treatment Rehabilitation  ONSET DATE: 07/2020  SUBJECTIVE:                                                                                                                                                                                           SUBJECTIVE STATEMENT: The loose stool has gotten better. The pessary is helping the prolapse.  Fluid intake: Yes: water     PAIN:  Are you having pain? No  PRECAUTIONS: Other: skin cancer  WEIGHT BEARING RESTRICTIONS No  FALLS:  Has patient fallen in last 6 months? No  LIVING ENVIRONMENT: Lives with: lives alone  OCCUPATION: retired  PLOF: Independent  PATIENT GOALS reduce leakage  PERTINENT HISTORY:  Skin cancer; Myocardia infarction; Hypertension; vaginal hysterectomy 2014  BOWEL MOVEMENT Pain with bowel movement: No Type of bowel movement:Type (Bristol Stool Scale) Type, Frequency daily and sometimes 2-3 timesper day, Strain No, and Splinting no Fully empty rectum: Yes:   Leakage: Yes: only when the stool is loose Pads: No Fiber supplement: No  URINATION Pain with urination: No Fully empty bladder: Yes: most of the time Stream: Weak Urgency: Yes: has to rush to the bathroom Frequency: daytime every few hours; night time 2-3 times Leakage: Urge to void, Walking to the bathroom, Coughing, and Sneezing Pads: No  INTERCOURSE;  is not sexually active at this time  PREGNANCY Vaginal deliveries 4   PROLAPSE Cystocele stage ll Rectocele stage l; stage 1 apical prolapse Gellhorn pessary removed    OBJECTIVE:   DIAGNOSTIC FINDINGS:  Pelvic floor strength I/V; A PVR of 6 ml was obtained by bladder scan.   COGNITION:  Overall cognitive status: Within functional limits for tasks assessed     SENSATION:  Light touch: Appears intact  Proprioception: Appears intact                   PALPATION:   General  good pelvic alignment                 External Perineal Exam redness on the labia  Internal Pelvic Floor tightness on the perineal body. Initially the strength of the pelvic floor was 1/5 but after manual work to the introitus the strength increased to 2/5. She would use her hip adductors and gluteal to contract the pelvic floor. She was able to breath during the contraction and contract the lower abdominals.   Patient confirms identification and approves PT to assess internal pelvic floor and treatment Yes  PELVIC MMT:   MMT eval  Vaginal 1/5  Internal Anal Sphincter   External Anal Sphincter   Puborectalis   Diastasis Recti   (Blank rows = not tested)        TONE: normal  PROLAPSE: Patient had her pessary in.   TODAY'S TREATMENT  EVAL Date: 10/01/2021 HEP established-see below    PATIENT EDUCATION:  Education details: Access Code: [email or web address] Person educated: Patient Education method: Explanation, Demonstration, Tactile cues, Verbal cues, and Handouts Education comprehension: verbalized understanding, returned demonstration, verbal cues required, tactile cues required, and needs further education   HOME EXERCISE PROGRAM: 10/01/2021 Access Code: [email or web address] URL: [email or web address] Date: 10/01/2021 Prepared by: Earlie Counts  Exercises - Supine Pelvic Floor Contraction  - 4 x daily - 7 x weekly - 1 sets - 5 reps - 5 sec hold  ASSESSMENT:  CLINICAL IMPRESSION: Patient is a 81 y.o. female who was seen today for physical therapy evaluation and treatment for urinary leakage and fecal leakage. Patient reports he leakage started in 07/2020. She will leak with urge to void, walking to the bathroom, coughing, and sneezing. She will leak stool with urgency. Pelvic floor strength is 1/5 with compensation using the hip adductors and gluteal. Patient was able to contract to 2/5 after the therapist performed manual work on the introitus. She was able to contract the pelvic floor  without holding her breath and contract her lower abdominals correctly. Patient has her pessary in during the exam. Patient will benefit from skilled therapy to improve pelvic floor strength and coordination to reduce leakage.    OBJECTIVE IMPAIRMENTS decreased coordination, decreased endurance, and decreased strength.   ACTIVITY LIMITATIONS continence and toileting  PARTICIPATION LIMITATIONS: community activity  PERSONAL FACTORS Skin cancer; Myocardia infarction; Hypertension; vaginal hysterectomy 2014 are also affecting patient's functional outcome.   REHAB POTENTIAL: Good  CLINICAL DECISION MAKING: Stable/uncomplicated  EVALUATION COMPLEXITY: Low   GOALS: Goals reviewed with patient? Yes  SHORT TERM GOALS: Target date: 10/29/2021  Patient able to contract her pelvic floor without contracting the gluteals and hip adductors.  Baseline: Goal status: INITIAL   LONG TERM GOALS: Target date: 12/24/2021   Patient independent with HEP for pelvic floor strengthening to reduce leakage.  Baseline:  Goal status: INITIAL  2.  Patient is able to walk to the bathroom after urgency for urination or bowel movement without leakage.  Baseline:  Goal status: INITIAL  3.  Patient reports her urgency has decreased >/= 70% due to the ability to perform behavorial techniques.  Baseline:  Goal status: INITIAL  4.  Patient reports her urinary leakage decreased >/= 50% due to increased in strength.  Baseline:  Goal status: INITIAL  5.  Patient reports her fecal leakage decreased >/= 50% due to improved pelvic floor coordination.  Baseline:  Goal status: INITIAL   PLAN: PT FREQUENCY: 1x/week  PT DURATION: 12 weeks  PLANNED INTERVENTIONS: Therapeutic exercises, Therapeutic activity, Neuromuscular re-education, Patient/Family education, Self Care, Biofeedback, and Manual therapy  PLAN FOR NEXT SESSION: work on pelvic floor contraction with quick flicks and in sitting; standing on one  foot and tandem stance; abdominal strength   Earlie Counts, PT 10/01/21 10:15 AM

## 2021-10-01 ENCOUNTER — Other Ambulatory Visit: Payer: Self-pay

## 2021-10-01 ENCOUNTER — Encounter: Payer: Medicare Other | Attending: Obstetrics and Gynecology | Admitting: Physical Therapy

## 2021-10-01 ENCOUNTER — Encounter: Payer: Self-pay | Admitting: Physical Therapy

## 2021-10-01 DIAGNOSIS — M6281 Muscle weakness (generalized): Secondary | ICD-10-CM | POA: Insufficient documentation

## 2021-10-01 DIAGNOSIS — R278 Other lack of coordination: Secondary | ICD-10-CM | POA: Insufficient documentation

## 2021-10-02 ENCOUNTER — Other Ambulatory Visit (HOSPITAL_COMMUNITY): Payer: Self-pay

## 2021-10-02 MED ORDER — ESTRADIOL 0.1 MG/GM VA CREA
TOPICAL_CREAM | VAGINAL | 3 refills | Status: DC
  Filled 2021-10-02: qty 42.5, 90d supply, fill #0

## 2021-10-10 ENCOUNTER — Encounter: Payer: Medicare Other | Attending: Obstetrics and Gynecology | Admitting: Physical Therapy

## 2021-10-10 ENCOUNTER — Encounter: Payer: Self-pay | Admitting: Physical Therapy

## 2021-10-10 DIAGNOSIS — N393 Stress incontinence (female) (male): Secondary | ICD-10-CM | POA: Insufficient documentation

## 2021-10-10 DIAGNOSIS — R159 Full incontinence of feces: Secondary | ICD-10-CM | POA: Diagnosis not present

## 2021-10-10 DIAGNOSIS — R278 Other lack of coordination: Secondary | ICD-10-CM | POA: Insufficient documentation

## 2021-10-10 DIAGNOSIS — M6281 Muscle weakness (generalized): Secondary | ICD-10-CM | POA: Insufficient documentation

## 2021-10-10 DIAGNOSIS — R3915 Urgency of urination: Secondary | ICD-10-CM | POA: Insufficient documentation

## 2021-10-10 NOTE — Patient Instructions (Addendum)
Urge Incontinence  Ideal urination frequency is every 2-4 wakeful hours, which equates to 5-8 times within a 24-hour period.   Urge incontinence is leakage that occurs when the bladder muscle contracts, creating a sudden need to go before getting to the bathroom.   Going too often when your bladder isn't actually full can disrupt the body's automatic signals to store and hold urine longer, which will increase urgency/frequency.  In this case, the bladder "is running the show" and strategies can be learned to retrain this pattern.   One should be able to control the first urge to urinate, at around 165m.  The bladder can hold up to a "grande latte," or 4075m To help you gain control, practice the Urge Drill below when urgency strikes.  This drill will help retrain your bladder signals and allow you to store and hold urine longer.  The overall goal is to stretch out your time between voids to reach a more manageable voiding schedule.    Practice your "quick flicks" often throughout the day (each waking hour) even when you don't need feel the urge to go.  This will help strengthen your pelvic floor muscles, making them more effective in controlling leakage.  Urge Drill  When you feel an urge to go, follow these steps to regain control: Stop what you are doing and be still Take one deep breath, directing your air into your abdomen Think an affirming thought, such as "I've got this." Do 5 quick flicks of your pelvic floor Raise your heels in sitting or standing 5 times Walk with control to the bathroom to void, or delay voiding If you get the urge to void again while walking to the bathroom repeat above.    Vaginal Estrogen How to get vaginal estrogen for $20 per tube Sign up at [email or web address]  Have your doctor send the prescription to [facility] in their electronic medical record  Have it mailed to your house  Prescription should read Estradiol 0.01% cream 1 gram daily in the  vagina for 2 weeks then 1 gram twice weekly forever  Will take 2-3 months to work and only works with continued use.   ChEarlie CountsPT [facility] [address] [address]: [phone]

## 2021-10-10 NOTE — Therapy (Signed)
OUTPATIENT PHYSICAL THERAPY TREATMENT NOTE   Patient Name: Melissa Gibbs MRN: [id number] DOB:03-17-1940, 81 y.o., female Today's Date: 10/10/2021  PCP: Precious Gilding, DO REFERRING PROVIDER: Jaquita Folds, MD  END OF SESSION:   PT End of Session - 10/10/21 1041     Visit Number 2    Date for PT Re-Evaluation 12/24/21    Authorization Type [place] - Visit Number 2    Authorization - Number of Visits 10    PT Start Time 4098    PT Stop Time 1120    PT Time Calculation (min) 38 min    Activity Tolerance Patient tolerated treatment well    Behavior During Therapy [facility] for tasks assessed/performed             Past Medical History:  Diagnosis Date   ACS (acute coronary syndrome) ([place]) 11/20/2017   CAD (coronary artery disease) 11/20/2017   a. Dx STEMI on 11/20/2017 s/p staged PCI with DES to pLCx and DES to mid LAD   Cataract    Hyperlipidemia    Hypertension    Ischemic cardiomyopathy    a. Echo 11/22/2017 w/ LVEF of 35-40%, hypokinesis of basal and mid inferolateral, inferior, and apical inferior and lateral walls, and G1DD   Myocardial infarction [facility])    PAD (peripheral artery disease) ([place])    a. Right radial artery noted to be occluded during heart catheterization in 11/2017.   Paroxysmal atrial fibrillation in setting of acute MI    a. Brief episode of atrial fibrillation in setting of acute STEMI - no chronic anticoagulation needed unless recurrent episodes   Shingles 2000   R flank, mild, self reported, did not present to care    Skin cancer    Past Surgical History:  Procedure Laterality Date   CATARACT EXTRACTION, BILATERAL     CORONARY STENT INTERVENTION N/A 11/20/2017   Procedure: CORONARY STENT INTERVENTION;  Surgeon: Sherren Mocha, MD;  Location: [place] CV LAB;  Service: Cardiovascular;  Laterality: N/A;   CORONARY STENT INTERVENTION N/A 11/23/2017   Procedure: CORONARY STENT INTERVENTION;  Surgeon: Burnell Blanks, MD;  Location: [place] CV LAB;  Service: Cardiovascular;  Laterality: N/A;   CORONARY/GRAFT ACUTE MI REVASCULARIZATION N/A 11/20/2017   Procedure: Coronary/Graft Acute MI Revascularization;  Surgeon: Sherren Mocha, MD;  Location: [place] CV LAB;  Service: Cardiovascular;  Laterality: N/A;   LEFT HEART CATH AND CORONARY ANGIOGRAPHY N/A 11/20/2017   Procedure: LEFT HEART CATH AND CORONARY ANGIOGRAPHY;  Surgeon: Sherren Mocha, MD;  Location: [place] CV LAB;  Service: Cardiovascular;  Laterality: N/A;   SKIN CANCER EXCISION  2014   Sees [facility] Dermatology regularly   VAGINAL HYSTERECTOMY     fibroids    Patient Active Problem List   Diagnosis Date Noted   Pain and swelling of ankle, right 07/14/2021   Back pain 01/02/2021   Depressed mood 10/25/2020   Fatigue 10/25/2020   Urinary tract obstruction 07/18/2020   Tension headache 06/13/2020   Dizziness 09/14/2019   Elevated random blood glucose level 08/27/2019   Poison ivy dermatitis 05/13/2019   Eustachian tube disorder, bilateral 03/31/2018   Ischemic cardiomyopathy 11/24/2017   Paroxysmal atrial fibrillation ([place]) 11/24/2017   PAD (peripheral artery disease) ([place]) 11/24/2017   Coronary artery disease involving native coronary artery of native heart without angina pectoris    STEMI involving left circumflex coronary artery ([place])    Seasonal allergies 04/28/2017   Healthcare maintenance 11/07/2015   SK (  seborrheic keratosis) 05/20/2013   Cystocele, grade 2 05/20/2013   Overweight (BMI 25.0-29.9) 05/20/2013   H/O nonmelanoma skin cancer 05/20/2013   UTI (urinary tract infection) 10/15/2011   Hyperlipidemia LDL goal <70 04/02/2006   Essential hypertension 04/02/2006   REFERRING DIAG:  R39.15 (ICD-10-CM) - Urinary urgency  N39.3 (ICD-10-CM) - SUI (stress urinary incontinence, female)  R15.9 (ICD-10-CM) - Incontinence of feces, unspecified fecal incontinence type      THERAPY DIAG:  Muscle  weakness (generalized)   Other lack of coordination   Rationale for Evaluation and Treatment Rehabilitation   ONSET DATE: 07/2020   SUBJECTIVE:                                                                                                                                                                                            SUBJECTIVE STATEMENT: I feel better. Since the pessary was taken out and cleaned I have felt better. I am doing my kegels. I wake up 2-3 times per night and will do my Kegels and now able to walk to the bathroom slowly.  I have not been wearing a pad since last visit. Patient has still had some stool leakage but she reports it is better. Stool leakage only happens when she puts off going to the bathroom.    PAIN:  Are you having pain? No   PRECAUTIONS: Other: skin cancer   WEIGHT BEARING RESTRICTIONS No   FALLS:  Has patient fallen in last 6 months? No   LIVING ENVIRONMENT: Lives with: lives alone   OCCUPATION: retired   PLOF: Independent   PATIENT GOALS reduce leakage   PERTINENT HISTORY:  Skin cancer; Myocardia infarction; Hypertension; vaginal hysterectomy 2014   BOWEL MOVEMENT Pain with bowel movement: No Type of bowel movement:Type (Bristol Stool Scale) Type, Frequency daily and sometimes 2-3 timesper day, Strain No, and Splinting no Fully empty rectum: Yes:   Leakage: Yes: only when the stool is loose Pads: No Fiber supplement: No   URINATION Pain with urination: No Fully empty bladder: Yes: most of the time Stream: Weak Urgency: Yes: has to rush to the bathroom Frequency: daytime every few hours; night time 2-3 times Leakage: Urge to void, Walking to the bathroom, Coughing, and Sneezing Pads: No   INTERCOURSE;  is not sexually active at this time   PREGNANCY Vaginal deliveries 4     PROLAPSE Cystocele stage ll Rectocele stage l; stage 1 apical prolapse Gellhorn pessary removed       OBJECTIVE:    DIAGNOSTIC FINDINGS:   Pelvic floor strength I/V; A PVR of 6 ml was obtained by bladder scan.  COGNITION:            Overall cognitive status: Within functional limits for tasks assessed                          SENSATION:            Light touch: Appears intact            Proprioception: Appears intact                      PALPATION:   General  good pelvic alignment                 External Perineal Exam redness on the labia                             Internal Pelvic Floor tightness on the perineal body. Initially the strength of the pelvic floor was 1/5 but after manual work to the introitus the strength increased to 2/5. She would use her hip adductors and gluteal to contract the pelvic floor. She was able to breath during the contraction and contract the lower abdominals.    Patient confirms identification and approves PT to assess internal pelvic floor and treatment Yes   PELVIC MMT:   MMT eval  Vaginal 1/5  (Blank rows = not tested)         TONE: normal   PROLAPSE: Patient had her pessary in.    TODAY'S TREATMENT  10/10/2021 Neuromuscular re-education: Pelvic floor contraction training:sitting pelvic floor contraction holding 10 sec with slight trunk lean 10x  Sitting 5 quick contractions with the pelvic floor Standing marching with pelvic floor and abdominal contraction 10 x 2 with one hand support One legged stance 10 sec with pelvic floor contraction 5 x each leg Tandem stance holding for 30 sec with support 3 times each way Self-care: Educated patient on the urge to void during the day and how to control her pelvic floor so she can walk slowly without leakage.     EVAL Date: 10/01/2021 HEP established-see below      PATIENT EDUCATION:  Education details: Access Code: [email or web address], urge to void Person educated: Patient Education method: Explanation, Demonstration, Tactile cues, Verbal cues, and Handouts Education comprehension: verbalized understanding, returned demonstration,  verbal cues required, tactile cues required, and needs further education     HOME EXERCISE PROGRAM: 10/09/2021 Access Code: [email or web address] URL: [email or web address] Date: 10/10/2021 Prepared by: Earlie Counts  Exercises - Supine Pelvic Floor Contraction  - 4 x daily - 7 x weekly - 1 sets - 5 reps - 5 sec hold - Seated Pelvic Floor Contraction  - 3 x daily - 7 x weekly - 1 sets - 10 reps - 10 hold - Seated Quick Flick Pelvic Floor Contractions  - 3 x daily - 7 x weekly - 3 sets - 10 reps - Standing Marching  - 1 x daily - 7 x weekly - 2 sets - 10 reps - Single Leg Stance with Support  - 1 x daily - 7 x weekly - 1 sets - 5 reps - 5 sec hold - Tandem Stance with Support  - 1 x daily - 7 x weekly - 1 sets - 3 reps - 30 sec hold  ASSESSMENT:   CLINICAL IMPRESSION: Patient is a 81 y.o. female who was seen today for physical therapy  treatment for urinary leakage and fecal  leakage.  she is able to contract her pelvic floor without contracting her gluteals or hip adductors. She is able to contract her pelvic floor in bed and walk to the bathroom without leaking. Patient has not had to wear pads since last visit. She still leaks stool but not as much. Patient will benefit from skilled therapy to improve pelvic floor strength and coordination to reduce leakage.      OBJECTIVE IMPAIRMENTS decreased coordination, decreased endurance, and decreased strength.    ACTIVITY LIMITATIONS continence and toileting   PARTICIPATION LIMITATIONS: community activity   PERSONAL FACTORS Skin cancer; Myocardia infarction; Hypertension; vaginal hysterectomy 2014 are also affecting patient's functional outcome.    REHAB POTENTIAL: Good   CLINICAL DECISION MAKING: Stable/uncomplicated   EVALUATION COMPLEXITY: Low     GOALS: Goals reviewed with patient? Yes   SHORT TERM GOALS: Target date: 10/29/2021   Patient able to contract her pelvic floor without contracting the gluteals and hip adductors.   Baseline: Goal status: Met 10/09/2021    LONG TERM GOALS: Target date: 12/24/2021    Patient independent with HEP for pelvic floor strengthening to reduce leakage.  Baseline:  Goal status: INITIAL   2.  Patient is able to walk to the bathroom after urgency for urination or bowel movement without leakage.  Baseline:  Goal status: INITIAL   3.  Patient reports her urgency has decreased >/= 70% due to the ability to perform behavorial techniques.  Baseline:  Goal status: INITIAL   4.  Patient reports her urinary leakage decreased >/= 50% due to increased in strength.  Baseline:  Goal status: INITIAL   5.  Patient reports her fecal leakage decreased >/= 50% due to improved pelvic floor coordination.  Baseline:  Goal status: INITIAL     PLAN: PT FREQUENCY: 1x/week   PT DURATION: 12 weeks   PLANNED INTERVENTIONS: Therapeutic exercises, Therapeutic activity, Neuromuscular re-education, Patient/Family education, Self Care, Biofeedback, and Manual therapy   PLAN FOR NEXT SESSION: abdominal strength, if doing better than discharge.  Earlie Counts, PT 10/10/21 11:27 AM

## 2021-10-17 ENCOUNTER — Encounter: Payer: Self-pay | Admitting: Physical Therapy

## 2021-10-17 ENCOUNTER — Encounter: Payer: Medicare Other | Admitting: Physical Therapy

## 2021-10-17 DIAGNOSIS — R278 Other lack of coordination: Secondary | ICD-10-CM

## 2021-10-17 DIAGNOSIS — M6281 Muscle weakness (generalized): Secondary | ICD-10-CM | POA: Diagnosis not present

## 2021-10-17 DIAGNOSIS — R3915 Urgency of urination: Secondary | ICD-10-CM | POA: Diagnosis not present

## 2021-10-17 NOTE — Therapy (Signed)
OUTPATIENT PHYSICAL THERAPY TREATMENT NOTE   Patient Name: Melissa Gibbs MRN: [id number] DOB:01/27/1941, 81 y.o., female Today's Date: 10/17/2021  PCP: Precious Gilding, DO REFERRING PROVIDER: Jaquita Folds, MD  END OF SESSION:   PT End of Session - 10/17/21 1302     Visit Number 3    Date for PT Re-Evaluation 12/24/21    Authorization Type [employer] medicare    Authorization - Visit Number 3    Authorization - Number of Visits 10    PT Start Time 1130    PT Stop Time 1210    PT Time Calculation (min) 40 min    Activity Tolerance Patient tolerated treatment well    Behavior During Therapy [facility] for tasks assessed/performed             Past Medical History:  Diagnosis Date   ACS (acute coronary syndrome) ([place]) 11/20/2017   CAD (coronary artery disease) 11/20/2017   a. Dx STEMI on 11/20/2017 s/p staged PCI with DES to pLCx and DES to mid LAD   Cataract    Hyperlipidemia    Hypertension    Ischemic cardiomyopathy    a. Echo 11/22/2017 w/ LVEF of 35-40%, hypokinesis of basal and mid inferolateral, inferior, and apical inferior and lateral walls, and G1DD   Myocardial infarction [facility])    PAD (peripheral artery disease) ([place])    a. Right radial artery noted to be occluded during heart catheterization in 11/2017.   Paroxysmal atrial fibrillation in setting of acute MI    a. Brief episode of atrial fibrillation in setting of acute STEMI - no chronic anticoagulation needed unless recurrent episodes   Shingles 2000   R flank, mild, self reported, did not present to care    Skin cancer    Past Surgical History:  Procedure Laterality Date   CATARACT EXTRACTION, BILATERAL     CORONARY STENT INTERVENTION N/A 11/20/2017   Procedure: CORONARY STENT INTERVENTION;  Surgeon: Sherren Mocha, MD;  Location: [place] CV [facility];  Service: Cardiovascular;  Laterality: N/A;   CORONARY STENT INTERVENTION N/A 11/23/2017   Procedure: CORONARY STENT INTERVENTION;  Surgeon: Burnell Blanks, MD;  Location: [place] CV [facility];  Service: Cardiovascular;  Laterality: N/A;   CORONARY/GRAFT ACUTE MI REVASCULARIZATION N/A 11/20/2017   Procedure: Coronary/Graft Acute MI Revascularization;  Surgeon: Sherren Mocha, MD;  Location: [place] [facility];  Service: Cardiovascular;  Laterality: N/A;   LEFT HEART CATH AND CORONARY ANGIOGRAPHY N/A 11/20/2017   Procedure: LEFT HEART CATH AND CORONARY ANGIOGRAPHY;  Surgeon: Sherren Mocha, MD;  Location: [place] CV [facility];  Service: Cardiovascular;  Laterality: N/A;   SKIN CANCER EXCISION  2014   Sees [facility] regularly   VAGINAL HYSTERECTOMY     fibroids    Patient Active Problem List   Diagnosis Date Noted   Pain and swelling of ankle, right 07/14/2021   Back pain 01/02/2021   Depressed mood 10/25/2020   Fatigue 10/25/2020   Urinary tract obstruction 07/18/2020   Tension headache 06/13/2020   Dizziness 09/14/2019   Elevated random blood glucose level 08/27/2019   Poison ivy dermatitis 05/13/2019   Eustachian tube disorder, bilateral 03/31/2018   Ischemic cardiomyopathy 11/24/2017   Paroxysmal atrial fibrillation ([place]) 11/24/2017   PAD (peripheral artery disease) ([place]) 11/24/2017   Coronary artery disease involving native coronary artery of native heart without angina pectoris    STEMI involving left circumflex coronary artery ([place])    Seasonal allergies 04/28/2017   Healthcare maintenance 11/07/2015   SK (  seborrheic keratosis) 05/20/2013   Cystocele, grade 2 05/20/2013   Overweight (BMI 25.0-29.9) 05/20/2013   H/O nonmelanoma skin cancer 05/20/2013   UTI (urinary tract infection) 10/15/2011   Hyperlipidemia LDL goal <70 04/02/2006   Essential hypertension 04/02/2006   REFERRING DIAG:  R39.15 (ICD-10-CM) - Urinary urgency  N39.3 (ICD-10-CM) - SUI (stress urinary incontinence, female)  R15.9 (ICD-10-CM) - Incontinence of feces, unspecified fecal incontinence type      THERAPY DIAG:  Muscle  weakness (generalized)   Other lack of coordination   Rationale for Evaluation and Treatment Rehabilitation   ONSET DATE: 07/2020   SUBJECTIVE:                                                                                                                                                                                            SUBJECTIVE STATEMENT: I have not had stool leakage since the last visit. Urine leakage is 98% better. The kegels do help.  PAIN:  Are you having pain? No   PRECAUTIONS: Other: skin cancer   WEIGHT BEARING RESTRICTIONS No   FALLS:  Has patient fallen in last 6 months? No   LIVING ENVIRONMENT: Lives with: lives alone   OCCUPATION: retired   PLOF: Independent   PATIENT GOALS reduce leakage   PERTINENT HISTORY:  Skin cancer; Myocardia infarction; Hypertension; vaginal hysterectomy 2014   BOWEL MOVEMENT Pain with bowel movement: No Type of bowel movement:Type (B[place] Stool Scale) Type, Frequency daily and sometimes 2-3 timesper day, Strain No, and Splinting no Fully empty rectum: Yes:   Leakage: Yes: only when the stool is loose Pads: No Fiber supplement: No   URINATION Pain with urination: No Fully empty bladder: Yes: most of the time Stream: Weak Urgency: no Frequency: daytime every few hours; night time 2-3 times Leakage: when walking to the bathroom and not contracting her pelvic floor.  Pads: No   INTERCOURSE;  is not sexually active at this time   PREGNANCY Vaginal deliveries 4     PROLAPSE Cystocele stage ll Rectocele stage l; stage 1 apical prolapse Gellhorn pessary removed       OBJECTIVE:    DIAGNOSTIC FINDINGS:  Pelvic floor strength I/V; A PVR of 6 ml was obtained by bladder scan.     COGNITION:            Overall cognitive status: Within functional limits for tasks assessed                          SENSATION:            Light touch: Appears intact  Proprioception: Appears intact                       PALPATION:   General  good pelvic alignment                 External Perineal Exam redness on the labia                             Internal Pelvic Floor tightness on the perineal body. Initially the strength of the pelvic floor was 1/5 but after manual work to the introitus the strength increased to 2/5. She would use her hip adductors and gluteal to contract the pelvic floor. She was able to breath during the contraction and contract the lower abdominals.    Patient confirms identification and approves PT to assess internal pelvic floor and treatment Yes  Patient does not want PT to assess the internal pelvic floor today. 10/17/2021 PELVIC MMT:   MMT eval  Vaginal 1/5  (Blank rows = not tested)         TONE: normal   PROLAPSE: Patient had her pessary in.    TODAY'S TREATMENT  10/17/2021  Neuromuscular re-education: Pelvic floor contraction training:itting pelvic floor contraction holding 10 sec with slight trunk lean 10x  Sitting 5 quick contractions with the pelvic floor Standing marching with pelvic floor and abdominal contraction 10 x 2 with one hand support One legged stance 10 sec with pelvic floor contraction 5 x each leg Tandem stance holding for 30 sec with support 3 times each way Self-care: Discussed with patient on ways to exercise at home using her DVD, you tube, and PBS channel. Discussed with patient on going the the senior center to exercise when she has her car.     10/10/2021 Neuromuscular re-education: Pelvic floor contraction training:sitting pelvic floor contraction holding 10 sec with slight trunk lean 10x  Sitting 5 quick contractions with the pelvic floor Standing marching with pelvic floor and abdominal contraction 10 x 2 with one hand support One legged stance 10 sec with pelvic floor contraction 5 x each leg Tandem stance holding for 30 sec with support 3 times each way Self-care: Educated patient on the urge to void during the day and how to  control her pelvic floor so she can walk slowly without leakage.     EVAL Date: 10/01/2021 HEP established-see below      PATIENT EDUCATION:  Education details: Access Code: [email or web address], ways to exercise at home Person educated: Patient Education method: Explanation, Demonstration, Tactile cues, Verbal cues, and Handouts Education comprehension: verbalized understanding, returned demonstration, verbal cues required, tactile cues required, and needs further education     HOME EXERCISE PROGRAM: 10/17/2021 Access Code: [email or web address] URL: [email or web address] Date: 10/10/2021 Prepared by: Earlie Counts   Exercises - Supine Pelvic Floor Contraction  - 4 x daily - 7 x weekly - 1 sets - 5 reps - 5 sec hold - Seated Pelvic Floor Contraction  - 3 x daily - 7 x weekly - 1 sets - 10 reps - 10 hold - Seated Quick Flick Pelvic Floor Contractions  - 3 x daily - 7 x weekly - 3 sets - 10 reps - Standing Marching  - 1 x daily - 7 x weekly - 2 sets - 10 reps - Single Leg Stance with Support  - 1 x daily - 7 x weekly - 1 sets - 5 reps - 5 sec hold -  Tandem Stance with Support  - 1 x daily - 7 x weekly - 1 sets - 3 reps - 30 sec hold   ASSESSMENT:   CLINICAL IMPRESSION: Patient is a 81 y.o. female who was seen today for physical therapy  treatment for urinary leakage and fecal leakage.  Patient reports her urinary and fecal leakage has improved by 98%. She is able to control the urge to void with contracting her pelvic floor and can walk to the bathroom without leaking. She understands ways to exercise at home when she does not have a car. Patient will benefit from skilled therapy to improve pelvic floor strength and coordination to reduce leakage.      OBJECTIVE IMPAIRMENTS decreased coordination, decreased endurance, and decreased strength.    ACTIVITY LIMITATIONS continence and toileting   PARTICIPATION LIMITATIONS: community activity   PERSONAL FACTORS Skin cancer; Myocardia infarction;  Hypertension; vaginal hysterectomy 2014 are also affecting patient's functional outcome.    REHAB POTENTIAL: Good   CLINICAL DECISION MAKING: Stable/uncomplicated   EVALUATION COMPLEXITY: Low     GOALS: Goals reviewed with patient? Yes   SHORT TERM GOALS: Target date: 10/29/2021   Patient able to contract her pelvic floor without contracting the gluteals and hip adductors.  Baseline: Goal status: Met 10/09/2021    LONG TERM GOALS: Target date: 12/24/2021    Patient independent with HEP for pelvic floor strengthening to reduce leakage.  Baseline:  Goal status: Met 10/17/2021   2.  Patient is able to walk to the bathroom after urgency for urination or bowel movement without leakage.  Baseline:  Goal status: Met 10/17/2021   3.  Patient reports her urgency has decreased >/= 70% due to the ability to perform behavorial techniques.  Baseline:  Goal status: Met 10/17/2021   4.  Patient reports her urinary leakage decreased >/= 50% due to increased in strength.  Baseline:  Goal status: Met 10/17/2021   5.  Patient reports her fecal leakage decreased >/= 50% due to improved pelvic floor coordination.  Baseline:  Goal status: Met 10/17/2021     PLAN: PT FREQUENCY: 1x/week   PT DURATION: 12 weeks   PLANNED INTERVENTIONS: Therapeutic exercises, Therapeutic activity, Neuromuscular re-education, Patient/Family education, Self Care, Biofeedback, and Manual therapy   PLAN FOR NEXT SESSION: Discharge to [place], PT 10/17/21 1:51 PM  PHYSICAL THERAPY DISCHARGE SUMMARY  Visits from Start of Care: 3  Current functional level related to goals / functional outcomes: See above.    Remaining deficits: See above.    Education / Equipment: HEP   Patient agrees to discharge. Patient goals were met. Patient is being discharged due to meeting the stated rehab goals. Thank you for the referral. Earlie Counts, PT 10/17/21 1:56 PM

## 2021-10-23 ENCOUNTER — Other Ambulatory Visit: Payer: Self-pay | Admitting: Cardiovascular Disease

## 2021-10-23 MED ORDER — CARVEDILOL 6.25 MG PO TABS: 6.2500 mg | ORAL_TABLET | Freq: Two times a day (BID) | ORAL | 3 refills | Status: DC

## 2021-10-24 ENCOUNTER — Encounter: Payer: Medicare Other | Admitting: Physical Therapy

## 2021-10-28 ENCOUNTER — Other Ambulatory Visit: Payer: Self-pay

## 2021-10-28 MED ORDER — IRBESARTAN 300 MG PO TABS: 300.0000 mg | ORAL_TABLET | Freq: Every day | ORAL | 0 refills | Status: DC

## 2021-11-10 ENCOUNTER — Other Ambulatory Visit: Payer: Self-pay | Admitting: Family Medicine

## 2021-11-25 ENCOUNTER — Encounter: Payer: Self-pay | Admitting: *Deleted

## 2021-11-28 ENCOUNTER — Other Ambulatory Visit: Payer: Self-pay | Admitting: Cardiovascular Disease

## 2021-12-04 ENCOUNTER — Ambulatory Visit: Payer: Medicare Other | Admitting: Obstetrics and Gynecology

## 2021-12-09 ENCOUNTER — Ambulatory Visit: Payer: Medicare Other | Admitting: Obstetrics and Gynecology

## 2021-12-09 NOTE — Progress Notes (Signed)
[facility]   Subjective:     Chief Complaint:  Chief Complaint  Patient presents with   Vaginal Prolapse   History of Present Illness: Melissa Gibbs is a 81 y.o. female with stage II pelvic organ prolapse who presents for a pessary check. She is using a size #2 1/5 long stem gellhorn pessary. The pessary has been working well and she has no complaints. She is using vaginal estrogen but reports this is not done as frequently as she should. She denies vaginal bleeding.  She reports she did some physical therapy and is more cognizant of how she does her Kegel exercises. States when she wakes up at night (3-4 times nightly) she tries to do Kegel exercises to prevent leaking when trying to make it to the bathroom.   Past Medical History: Patient  has a past medical history of ACS (acute coronary syndrome) ([place]) (11/20/2017), CAD (coronary artery disease) (11/20/2017), Cataract, Hyperlipidemia, Hypertension, Ischemic cardiomyopathy, Myocardial infarction [facility]), PAD (peripheral artery disease) ([place]), Paroxysmal atrial fibrillation in setting of acute MI, Shingles (2000), and Skin cancer.   Past Surgical History: She  has a past surgical history that includes Vaginal hysterectomy; Skin cancer excision (2014); Coronary/Graft Acute MI Revascularization (N/A, 11/20/2017); LEFT HEART CATH AND CORONARY ANGIOGRAPHY (N/A, 11/20/2017); CORONARY STENT INTERVENTION (N/A, 11/20/2017); CORONARY STENT INTERVENTION (N/A, 11/23/2017); and Cataract extraction, bilateral.   Medications: She has a current medication list which includes the following prescription(s): alendronate, atorvastatin, carvedilol, cholecalciferol, clopidogrel, doxycycline, estradiol, irbesartan, and nitroglycerin.   Allergies: Patient is allergic to amoxicillin.   Social History: Patient  reports that she has quit smoking. Her smoking use included cigarettes. She has never used smokeless tobacco. She reports current alcohol  use. She reports that she does not use drugs.      Objective:    Physical Exam: BP (!) 198/91 (BP Location: Left Arm, Patient Position: Sitting, Cuff Size: Large)   Pulse 68   Ht '5\' 1"'$  (1.549 m)   Wt 160 lb (72.6 kg)   BMI 30.23 kg/m  Gen: No apparent distress, A&O x 3. Detailed Urogynecologic Evaluation:  Pelvic Exam: Normal external female genitalia; Bartholin's and Skene's glands normal in appearance; urethral meatus with caruncle, no urethral masses or discharge. The pessary was noted to be in place. It was removed and cleaned. Speculum exam revealed no lesions in the vagina. The pessary was replaced. It was comfortable to the patient and fit well.     Assessment/Plan:    Assessment: Ms. Berish is a 81 y.o. with stage II pelvic organ prolapse here for a pessary check. She is doing well.  Plan: She will keep the pessary in place until next visit. We discussed that she would like to try to take the pessary out and manage it herself, but we discussed this would be very difficult, as the Gelhorn suctions in and would be hard for her to safely and comfortable remove. She will continue to use estrogen. She will follow-up in 3 months for a pessary check or sooner as needed.   Discussed the importance of using the vaginal estrogen twice weekly. She states she struggles with regimented care but will do her best to use the cream twice a week. She requested a refill, which has been sent to the pharmacy.   Discussed possible use of medication for her urinary frequency and she was not interested at this time. She reports she is good about taking her blood pressure medication (except for today), but is not  currently interested in adding another medication.   All questions were answered.

## 2021-12-13 ENCOUNTER — Ambulatory Visit (INDEPENDENT_AMBULATORY_CARE_PROVIDER_SITE_OTHER): Payer: Medicare Other | Admitting: Obstetrics and Gynecology

## 2021-12-13 ENCOUNTER — Other Ambulatory Visit (HOSPITAL_COMMUNITY): Payer: Self-pay

## 2021-12-13 ENCOUNTER — Encounter: Payer: Self-pay | Admitting: Obstetrics and Gynecology

## 2021-12-13 VITALS — BP 198/91 | HR 68 | Ht 61.0 in | Wt 160.0 lb

## 2021-12-13 DIAGNOSIS — R3915 Urgency of urination: Secondary | ICD-10-CM

## 2021-12-13 DIAGNOSIS — N8110 Cystocele, unspecified: Secondary | ICD-10-CM

## 2021-12-13 DIAGNOSIS — N393 Stress incontinence (female) (male): Secondary | ICD-10-CM

## 2021-12-13 MED ORDER — ESTRADIOL 0.1 MG/GM VA CREA
TOPICAL_CREAM | VAGINAL | 3 refills | Status: DC
Start: 2021-12-13 — End: 2023-10-12
  Filled 2021-12-13: qty 42.5, fill #0
  Filled 2022-05-08: qty 42.5, 30d supply, fill #0
  Filled 2022-11-27: qty 42.5, 30d supply, fill #1

## 2022-01-07 ENCOUNTER — Other Ambulatory Visit: Payer: Self-pay | Admitting: Student

## 2022-03-11 NOTE — Progress Notes (Unsigned)
[facility]   Subjective:     Chief Complaint: No chief complaint on file.  History of Present Illness: Melissa Gibbs is a 82 y.o. female with stage II pelvic organ prolapse who presents for a pessary check. She is using a size 2 1/5in long stem gellhorn pessary. The pessary has been working well and she has no complaints. She is using vaginal estrogen. She denies vaginal bleeding.  Past Medical History: Patient  has a past medical history of ACS (acute coronary syndrome) ([place]) (11/20/2017), CAD (coronary artery disease) (11/20/2017), Cataract, Hyperlipidemia, Hypertension, Ischemic cardiomyopathy, Myocardial infarction [facility] PAD (peripheral artery disease) ([place]), Paroxysmal atrial fibrillation in setting of acute MI, Shingles (2000), and Skin cancer.   Past Surgical History: She  has a past surgical history that includes Vaginal hysterectomy; Skin cancer excision (2014); Coronary/Graft Acute MI Revascularization (N/A, 11/20/2017); LEFT HEART CATH AND CORONARY ANGIOGRAPHY (N/A, 11/20/2017); CORONARY STENT INTERVENTION (N/A, 11/20/2017); CORONARY STENT INTERVENTION (N/A, 11/23/2017); and Cataract extraction, bilateral.   Medications: She has a current medication list which includes the following prescription(s): alendronate, atorvastatin, carvedilol, cholecalciferol, clopidogrel, doxycycline, estradiol, irbesartan, and nitroglycerin.   Allergies: Patient is allergic to amoxicillin.   Social History: Patient  reports that she has quit smoking. Her smoking use included cigarettes. She has never used smokeless tobacco. She reports current alcohol use. She reports that she does not use drugs.      Objective:    Physical Exam: There were no vitals taken for this visit. Gen: No apparent distress, A&O x 3. Detailed Urogynecologic Evaluation:  Pelvic Exam: Normal external female genitalia; Bartholin's and Skene's glands normal in appearance; urethral meatus {urethra:24773}, no  urethral masses or discharge. The pessary was noted to be {in place:24774}. It was removed and cleaned. Speculum exam revealed {vaginal lesions:24775} in the vagina. The pessary was replaced. It was comfortable to the patient and fit well.       No data to display          Laboratory Results: Urine dipstick shows: {ua dip:315374::"negative for all components"}.    Assessment/Plan:    Assessment: Melissa Gibbs is a 82 y.o. with stage II pelvic organ prolapse here for a pessary check. She is doing well.  Plan: She will keep the pessary in place until next visit. She will continue to use {lubricant:24777}. She will follow-up in *** {days/wks/mos/yrs:310907} for a pessary check or sooner as needed.  All questions were answered.   Time Spent:

## 2022-03-13 ENCOUNTER — Ambulatory Visit: Payer: Medicare Other | Admitting: Obstetrics and Gynecology

## 2022-03-13 ENCOUNTER — Encounter: Payer: Self-pay | Admitting: Obstetrics and Gynecology

## 2022-03-13 VITALS — BP 152/87 | HR 76

## 2022-03-13 DIAGNOSIS — I10 Essential (primary) hypertension: Secondary | ICD-10-CM | POA: Diagnosis not present

## 2022-03-13 DIAGNOSIS — R350 Frequency of micturition: Secondary | ICD-10-CM

## 2022-03-13 DIAGNOSIS — N8110 Cystocele, unspecified: Secondary | ICD-10-CM

## 2022-03-13 DIAGNOSIS — Z4689 Encounter for fitting and adjustment of other specified devices: Secondary | ICD-10-CM

## 2022-03-13 NOTE — Patient Instructions (Signed)
Please let your PCP know about your knee and your Blood pressure.    Use your vaginal estrogen cream twice a week.

## 2022-03-17 NOTE — Progress Notes (Unsigned)
    SUBJECTIVE:   CHIEF COMPLAINT / HPI:   HTN Patient currently takes Coreg 6.25 mg BID, irbesartan 300 mg daily  PERTINENT  PMH / PSH: CAD, history of STEMI, PAD, PAF, HTN  OBJECTIVE:   There were no vitals taken for this visit. ***  General: NAD, pleasant, able to participate in exam Cardiac: RRR, no murmurs. Respiratory: CTAB, normal effort, No wheezes, rales or rhonchi Abdomen: Bowel sounds present, nontender, nondistended, no hepatosplenomegaly. Extremities: no edema or cyanosis. Skin: warm and dry, no rashes noted Neuro: alert, no obvious focal deficits Psych: Normal affect and mood  ASSESSMENT/PLAN:   No problem-specific Assessment & Plan notes found for this encounter.     Dr. Precious Gilding, [place]    {    This will disappear when note is signed, click to select method of visit    :1}

## 2022-03-18 ENCOUNTER — Ambulatory Visit: Payer: Medicare Other | Admitting: Student

## 2022-03-18 ENCOUNTER — Other Ambulatory Visit (HOSPITAL_COMMUNITY): Payer: Self-pay

## 2022-03-18 ENCOUNTER — Encounter: Payer: Self-pay | Admitting: Student

## 2022-03-18 VITALS — BP 172/80 | HR 63 | Ht 60.0 in | Wt 159.4 lb

## 2022-03-18 DIAGNOSIS — I10 Essential (primary) hypertension: Secondary | ICD-10-CM

## 2022-03-18 MED ORDER — AMLODIPINE BESYLATE 5 MG PO TABS
5.0000 mg | ORAL_TABLET | Freq: Every day | ORAL | 0 refills | Status: DC
  Filled 2022-03-18: qty 30, 30d supply, fill #0

## 2022-03-18 NOTE — Assessment & Plan Note (Signed)
BP uncontrolled on current regimen, will add amlodipine 5 mg -BMP today -Return in 2 weeks for follow-up

## 2022-03-18 NOTE — Patient Instructions (Signed)
It was great to see you! Thank you for allowing me to participate in your care!  I recommend that you always bring your medications to each appointment as this makes it easy to ensure you are on the correct medications and helps [place] not miss when refills are needed.  Our plans for today:  - Start taking Amlodpine 5 mg daily in addition to irbesartan and Coreg  - Return in 2 weeks for a follow up visit   We are checking some labs today, I will call you if they are abnormal will send you a MyChart message or a letter if they are normal.  If you do not hear about your labs in the next 2 weeks please let us know.  Take care and seek immediate care sooner if you develop any concerns.   Dr. Precious Gilding, DO [facility]

## 2022-03-19 LAB — BASIC METABOLIC PANEL
BUN/Creatinine Ratio: 21 (ref 12–28)
BUN: 17 mg/dL (ref 8–27)
CO2: 23 mmol/L (ref 20–29)
Calcium: 9.2 mg/dL (ref 8.7–10.3)
Chloride: 101 mmol/L (ref 96–106)
Creatinine, Ser: 0.82 mg/dL (ref 0.57–1.00)
Glucose: 95 mg/dL (ref 70–99)
Potassium: 4.2 mmol/L (ref 3.5–5.2)
Sodium: 141 mmol/L (ref 134–144)
eGFR: 71 mL/min/{1.73_m2} (ref >59)

## 2022-03-24 ENCOUNTER — Encounter: Payer: Self-pay | Admitting: *Deleted

## 2022-03-25 ENCOUNTER — Other Ambulatory Visit: Payer: Self-pay

## 2022-03-25 ENCOUNTER — Telehealth: Payer: Self-pay

## 2022-03-25 ENCOUNTER — Other Ambulatory Visit (HOSPITAL_COMMUNITY): Payer: Self-pay

## 2022-03-25 NOTE — Telephone Encounter (Signed)
Patient calls nurse line in regards to Amlodipine.   She reports she started taking this medication on 2/13. She reports she takes this at bedtime. However, she reports when she wakes up in the night to go to the bathroom she feels "very dizzy." She reports when she wakes up in the morning she feels back to normal.  She reports symptoms started when she started Amlodipine.   She reports her blood pressures have been 103/66, 143/88, 138/85 the last few days after all medications.   She reports she spoke with her pharmacist and he suggested to take half Amlodipine at bedtime.   Patient has a FU scheduled with PCP on 3/1.  Will forward to PCP for advisement.   ED precautions discussed with patient.

## 2022-03-27 ENCOUNTER — Telehealth: Payer: Self-pay | Admitting: Student

## 2022-03-27 NOTE — Telephone Encounter (Signed)
Called pt to discuss symptoms of dizziness. Pt states she stopped taking the amlodipine recently prescribed d/t feeling dizzy. BP 173/103 this morning and 122/74 after taking her coreg and irbesartan.  Denies any headache, blurry vision. Dizziness is worse with standing and seems to improve throughout the day. Is trying to drink more water to stay hydrated. We discussed importance of getting up from lying to sitting to standing very slowly and having to something to hold on to. Will check orthostatics at next visit. Pt advised to record blood pressures daily after taking her medications and will bring these to the next apt. ED precautions given.

## 2022-04-03 NOTE — Progress Notes (Signed)
    SUBJECTIVE:   CHIEF COMPLAINT / HPI:   HTN Patient is currently taking Coreg 6.25 mg twice daily, irbesartan 300 mg daily.  Amlodipine 5 mg was added at last visit however patient states it made her feel very dizzy at nighttime and she quit taking it. She has been checking BPs at home after taking her medications, they have been ranging [id number] diastolic. No CP or SOB.   Dizziness Pt started feeling dizzy after starting amlodipine. Stopped taking it about a week and half ago. The symptoms have improved but she is still feeling a little dizzy in the mornings. This improves throughout the day and then she feels fine.   PERTINENT  PMH / PSH: HTN  OBJECTIVE:   Vitals:   04/04/22 0858 04/04/22 0943  BP: (!) 180/80 (!) 165/70  Pulse: 80 65  SpO2: 95% 97%    General: NAD, pleasant, able to participate in exam Cardiac: RRR, no murmurs. Respiratory: CTAB, normal effort, No wheezes, rales or rhonchi Skin: warm and dry Neuro: Cranial nerves II through XII intact, sensation intact, finger-nose-finger test normal.  Slow gait, unable to tandem walk. Psych: Normal affect and mood  ASSESSMENT/PLAN:   Essential hypertension BP not controlled but home readings haven't been terrible.  Will not add any additional medications at this time as patient is experiencing dizziness.  She should continue on her Coreg and irbesartan and return in 2 weeks for follow-up visit.  She is advised to continue checking her BPs at home in the meantime and bring them to her next appointment.  Dizziness Orthostatic vitals negative today for orthostatic hypotension.  Neuro exam is reassuring.  Also reassuring that dizziness is improving.  I offered to refer patient to vestibular PT as she appears to have balance issues along with this dizziness and I think she could benefit from it.  She declined vestibular PT at this time, would prefer to monitor her symptoms and see if they will resolve on their  own.  She will return in 2 weeks for follow-up.     Dr. Precious Gilding, [place]

## 2022-04-04 ENCOUNTER — Encounter: Payer: Self-pay | Admitting: Student

## 2022-04-04 ENCOUNTER — Ambulatory Visit (INDEPENDENT_AMBULATORY_CARE_PROVIDER_SITE_OTHER): Payer: Medicare Other | Admitting: Student

## 2022-04-04 VITALS — BP 165/70 | HR 65 | Ht 60.0 in | Wt 159.4 lb

## 2022-04-04 DIAGNOSIS — I10 Essential (primary) hypertension: Secondary | ICD-10-CM | POA: Diagnosis not present

## 2022-04-04 DIAGNOSIS — R42 Dizziness and giddiness: Secondary | ICD-10-CM | POA: Diagnosis not present

## 2022-04-04 NOTE — Assessment & Plan Note (Addendum)
Orthostatic vitals negative today for orthostatic hypotension.  Neuro exam is reassuring.  Also reassuring that dizziness is improving.  I offered to refer patient to vestibular PT as she appears to have balance issues along with this dizziness and I think she could benefit from it.  She declined vestibular PT at this time, would prefer to monitor her symptoms and see if they will resolve on their own.  She will return in 2 weeks for follow-up.

## 2022-04-04 NOTE — Assessment & Plan Note (Signed)
BP not controlled but home readings haven't been terrible.  Will not add any additional medications at this time as patient is experiencing dizziness.  She should continue on her Coreg and irbesartan and return in 2 weeks for follow-up visit.  She is advised to continue checking her BPs at home in the meantime and bring them to her next appointment.

## 2022-04-04 NOTE — Patient Instructions (Signed)
It was great to see you! Thank you for allowing me to participate in your care!  I recommend that you always bring your medications to each appointment as this makes it easy to ensure you are on the correct medications and helps [place] not miss when refills are needed.  Our plans for today:  - Continue on Coreg and irbesartan.  -Return in 2 weeks for follow up - If your dizzy spells worsen, you feel short of breath or have chest pain, return sooner or go to the emergency department   Take care and seek immediate care sooner if you develop any concerns.   Dr. Precious Gilding, DO [facility]

## 2022-04-29 ENCOUNTER — Ambulatory Visit (INDEPENDENT_AMBULATORY_CARE_PROVIDER_SITE_OTHER): Payer: Medicare Other | Admitting: Student

## 2022-04-29 ENCOUNTER — Encounter: Payer: Self-pay | Admitting: Student

## 2022-04-29 VITALS — BP 148/81 | HR 58 | Ht 60.0 in | Wt 158.8 lb

## 2022-04-29 DIAGNOSIS — I10 Essential (primary) hypertension: Secondary | ICD-10-CM | POA: Diagnosis not present

## 2022-04-29 NOTE — Progress Notes (Signed)
    SUBJECTIVE:   CHIEF COMPLAINT / HPI:   HTN At last visit 3/1 BP was elevated but medications were not changed as pt wanted to check BPs at home and she was experiencing some dizzines which has since completing. No CP, SOB, changes in vision. She has been checking BP at home, rangin from 1-teens to 99991111 systolic and 99991111 diastolic. She is currently taking Coreg and irbesartan.  She is very hesitant to add a third blood pressure lowering medication as she had a bad experience on the amlodipine and in general does not like taking medications.  She states she would really prefer to increase physical activity to lower blood pressure and also reduce her anxiety and see if this will help lower her blood pressure.  PERTINENT  PMH / PSH: HTN  OBJECTIVE:   BP (!) 148/81   Pulse (!) 58   Ht 5' (1.524 m)   Wt 158 lb 12.8 oz (72 kg)   SpO2 100%   BMI 31.01 kg/m    General: NAD, pleasant, able to participate in exam Cardiac: RRR, no murmurs. Respiratory: CTAB, normal effort, No wheezes, rales or rhonchi Neuro: alert, no obvious focal deficits Psych: Normal affect and mood  ASSESSMENT/PLAN:   Essential hypertension Shared decision-making was used and patient elects to stay on her current regimen of Coreg 6.25 mg twice daily and irbesartan 300 mg daily.  She would like to increase her physical activity as it is getting warmer outside which she hopes will decrease her anxiety and her blood pressure.  She plans to do this and check her blood pressure once daily and send me a MyChart message with the readings.  If she is consistently above 140s over 90s we will consider adding hydrochlorothiazide 12.5 mg to her regimen.     Dr. Precious Gilding, [place]

## 2022-04-29 NOTE — Patient Instructions (Signed)
It was great to see you! Thank you for allowing me to participate in your care!  I recommend that you always bring your medications to each appointment as this makes it easy to ensure you are on the correct medications and helps [place] not miss when refills are needed.  Our plans for today:  -We use shared decision making today and you have decided not to add another blood pressure lowering medication. -Continue to take the Coreg and irbesartan as prescribed -Please check your blood pressures once a day and record them.  You can send me a MyChart message in 2 weeks with your blood pressure readings.  If they are consistently at or above 140s/90s, my recommendation would be to add a third blood pressure lowering medication called hydrochlorothiazide. -I will be on the look out for your MyChart message.  If you need anything sooner, do not hesitate to call for an appointment  Take care and seek immediate care sooner if you develop any concerns.   Dr. Precious Gilding, DO [facility]

## 2022-04-29 NOTE — Assessment & Plan Note (Signed)
Shared decision-making was used and patient elects to stay on her current regimen of Coreg 6.25 mg twice daily and irbesartan 300 mg daily.  She would like to increase her physical activity as it is getting warmer outside which she hopes will decrease her anxiety and her blood pressure.  She plans to do this and check her blood pressure once daily and send me a MyChart message with the readings.  If she is consistently above 140s over 90s we will consider adding hydrochlorothiazide 12.5 mg to her regimen.

## 2022-05-08 ENCOUNTER — Other Ambulatory Visit (HOSPITAL_COMMUNITY): Payer: Self-pay

## 2022-06-04 NOTE — Patient Instructions (Signed)

## 2022-06-04 NOTE — Progress Notes (Signed)
  I connected with  Melissa Gibbs on 06/05/2022 by a audio enabled telemedicine application and verified that I am speaking with the correct person using two identifiers.  Patient Location: Home  Provider Location: Home Office  I discussed the limitations of evaluation and management by telemedicine. The patient expressed understanding and agreed to proceed.   Subjective:   Melissa Gibbs is a 81 y.o. female who presents for Medicare Annual (Subsequent) preventive examination.  Review of Systems    Per HPI unless specifically indicated below.  Cardiac Risk Factors include: advanced age (>55men, >65 women);female gender, Essential Hypertension, CAD, and Hyperlipidemia.          Objective:       04/29/2022    8:57 AM 04/29/2022    8:37 AM 04/04/2022    9:43 AM  Vitals with BMI  Height  5' 0"   Weight  158 lbs 13 oz   BMI  31.01   Systolic 148 168 165  Diastolic 81 78 70  Pulse 58 58 65    There were no vitals filed for this visit. There is no height or weight on file to calculate BMI.     06/05/2022    2:31 PM 04/29/2022    8:38 AM 04/04/2022   12:02 PM 03/18/2022    9:27 AM 10/01/2021    9:30 AM 08/05/2021    8:59 AM 07/10/2021   10:53 AM  Advanced Directives  Does Patient Have a Medical Advance Directive? No No No No Yes No No  Type of Advance Directive     Healthcare Power of Attorney;Living will    Does patient want to make changes to medical advance directive?     No - Patient declined    Copy of Healthcare Power of Attorney in Chart?     No - copy requested    Would patient like information on creating a medical advance directive? No - Patient declined   No - Patient declined  No - Patient declined No - Patient declined    Current Medications (verified) Outpatient Encounter Medications as of 06/05/2022  Medication Sig   alendronate (FOSAMAX) 10 MG tablet TAKE 1 TABLET BY MOUTH DAILY  BEFORE BREAKFAST WITH A FULL  GLASS OF WATER ON AN EMPTY  STOMACH   atorvastatin  (LIPITOR) 80 MG tablet TAKE 1 TABLET BY MOUTH DAILY AT  6 PM   carvedilol (COREG) 6.25 MG tablet Take 1 tablet (6.25 mg total) by mouth 2 (two) times daily with a meal.   Cholecalciferol 10 MCG (400 UNIT) CAPS Take 2 capsules (800 Units total) by mouth daily.   clopidogrel (PLAVIX) 75 MG tablet TAKE 1 TABLET BY MOUTH DAILY   estradiol (ESTRACE) 0.1 MG/GM vaginal cream Apply 1/2 inch nightly for 2 weeks followed by twice a week thereafter   irbesartan (AVAPRO) 300 MG tablet TAKE 1 TABLET BY MOUTH DAILY   nitroGLYCERIN (NITROSTAT) 0.4 MG SL tablet Place 1 tablet (0.4 mg total) under the tongue every 5 (five) minutes for 3 doses as needed for chest pain.   No facility-administered encounter medications on file as of 06/05/2022.    Allergies (verified) Amoxicillin   History: Past Medical History:  Diagnosis Date   ACS (acute coronary syndrome) (HCC) 11/20/2017   CAD (coronary artery disease) 11/20/2017   a. Dx STEMI on 11/20/2017 s/p staged PCI with DES to pLCx and DES to mid LAD   Cataract    Hyperlipidemia    Hypertension      Ischemic cardiomyopathy    a. Echo 11/22/2017 w/ LVEF of 35-40%, hypokinesis of basal and mid inferolateral, inferior, and apical inferior and lateral walls, and G1DD   Myocardial infarction (HCC)    PAD (peripheral artery disease) (HCC)    a. Right radial artery noted to be occluded during heart catheterization in 11/2017.   Paroxysmal atrial fibrillation in setting of acute MI    a. Brief episode of atrial fibrillation in setting of acute STEMI - no chronic anticoagulation needed unless recurrent episodes   Shingles 2000   R flank, mild, self reported, did not present to care    Skin cancer    Past Surgical History:  Procedure Laterality Date   CATARACT EXTRACTION, BILATERAL     CORONARY STENT INTERVENTION N/A 11/20/2017   Procedure: CORONARY STENT INTERVENTION;  Surgeon: Cooper, Michael, MD;  Location: [facility];  Service: Cardiovascular;   Laterality: N/A;   CORONARY STENT INTERVENTION N/A 11/23/2017   Procedure: CORONARY STENT INTERVENTION;  Surgeon: McAlhany, Christopher D, MD;  Location: [facility];  Service: Cardiovascular;  Laterality: N/A;   CORONARY/GRAFT ACUTE MI REVASCULARIZATION N/A 11/20/2017   Procedure: Coronary/Graft Acute MI Revascularization;  Surgeon: Cooper, Michael, MD;  Location: [facility];  Service: Cardiovascular;  Laterality: N/A;   LEFT HEART CATH AND CORONARY ANGIOGRAPHY N/A 11/20/2017   Procedure: LEFT HEART CATH AND CORONARY ANGIOGRAPHY;  Surgeon: Cooper, Michael, MD;  Location: [facility];  Service: Cardiovascular;  Laterality: N/A;   SKIN CANCER EXCISION  2014   Sees [facility] regularly   VAGINAL HYSTERECTOMY     fibroids    Family History  Problem Relation Age of Onset   Heart disease Mother 70       MI , rheumatic fever as a child, and heart murmur   Cancer Father        knee cancer   Colon cancer Father    Hypertension Father    Cancer Sister        melanoma - died at 32   Melanoma Sister    Heart disease Brother    Melanoma Other        died at age 30   Cancer Sister    Lung cancer Sister    Stroke Neg Hx    Social History   Socioeconomic History   Marital status: Divorced    Spouse name: Not on file   Number of children: 1   Years of education: 14   Highest education level: Associate degree: academic program  Occupational History   Occupation: retired    Employer: [employer]    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 Patients daughter helps her around her home and comes over often, Libby.    Patient enjoys hard work, playing with her animals, crafts, and spending time with her daughter.    Social Determinants of Health   Financial   Resource Strain: Low Risk  (06/05/2022)   Overall Financial Resource Strain (CARDIA)    Difficulty of Paying Living Expenses: Not hard at all  Food Insecurity: No Food Insecurity (06/05/2022)   Hunger Vital Sign    Worried About Running Out of Food in the Last Year: Never true    Ran Out of Food in the Last Year: Never true  Transportation Needs: No Transportation Needs (06/05/2022)   PRAPARE - Transportation    Lack of Transportation (Medical): No    Lack of Transportation (Non-Medical): No  Physical Activity: Inactive (06/05/2022)   Exercise Vital Sign    Days of Exercise per Week: 0 days    Minutes of Exercise per Session: 0 min  Stress: Stress Concern Present (06/05/2022)   [employer] - Occupational Stress Questionnaire    Feeling of Stress : To some extent  Social Connections: Moderately Integrated (06/05/2022)   Social Connection and Isolation Panel [NHANES]    Frequency of Communication with Friends and Family: More than three times a week    Frequency of Social Gatherings with Friends and Family: Twice a week    Attends Religious Services: 1 to 4 times per year    Active Member of Clubs or Organizations: Yes    Attends Club or Organization Meetings: Never    Marital Status: Divorced    Tobacco Counseling Counseling given: No Tobacco comments: quit smoking at age 29   Clinical Intake:  Pre-visit preparation completed: No  Pain : No/denies pain     Nutritional Status: BMI of 19-24  Normal Nutritional Risks: None Diabetes: No  How often do you need to have someone help you when you read instructions, pamphlets, or other written materials from your doctor or pharmacy?: 1 -  Never  Diabetic?No  Interpreter Needed?: No  Information entered by :: Melissa Gibbs, Melissa Gibbs   Activities of Daily Living    06/05/2022    2:14 PM  In your present state of health, do you have any difficulty performing the following activities:  Hearing? 0  Vision? 1  Comment Blurry vision left eye  Difficulty concentrating or making decisions? 1  Walking or climbing stairs? 1  Dressing or bathing? 0  Doing errands, shopping? 0    Patient Care Team: Jones, Sarah, DO as PCP - General (Family Medicine) Cooper, Michael, MD as PCP - Cardiology (Cardiology) Stinehelfer, Susan, MD as Referring Physician (Dermatology) Weaver, Scott T, PA-C as Physician Assistant (Cardiology)  Indicate any recent Medical Services you may have received from other than [facility] providers in the past year (date may be approximate).     Assessment:   This is a routine wellness examination for Melissa Gibbs.   Hearing/Vision screen Denies any hearing issues. Denies any change to her vision. Wear glasses. Overdue for Annual Eye Exam.   Dietary issues and exercise activities discussed: Current Exercise Habits: The patient does not participate in regular exercise at present, Exercise limited by: None identified   Goals Addressed   None   Depression Screen    04/29/2022    8:39 AM 04/04/2022    9:01 AM 03/18/2022    9:27 AM 08/05/2021    9:00 AM 07/10/2021   10:52 AM 01/02/2021    3:52 PM 12/20/2020    9:16 AM  PHQ 2/9 Scores  PHQ - 2 Score  2 0 0 1 0 1  PHQ- 9 Score  3 1 0 2 1 2  Exception Documentation Patient refusal            Fall Risk    06/05/2022    2:12 PM 04/04/2022   12:02 PM 03/18/2022    9:28 AM 08/05/2021    9:01 AM 07/10/2021   10:53 AM  Fall Risk   Falls in the past year? 1 0 1 0 0  Number falls in past yr: 0  0  0  Injury with Fall? 1  1  0  Risk for fall due to : Other (Comment)      Follow up Falls evaluation completed        FALL RISK PREVENTION PERTAINING TO THE HOME:  Any stairs in or  around the home? No  If so, are there any without handrails? No  Home free of loose throw rugs in walkways, pet beds, electrical cords, etc? No  Adequate lighting in your home to reduce risk of falls? Yes   ASSISTIVE DEVICES UTILIZED TO PREVENT FALLS:  Life alert? No  Use of a cane, walker or w/c? No  Grab bars in the bathroom? No  Shower chair or bench in shower? No  Elevated toilet seat or a handicapped toilet? No   TIMED UP AND GO:  Was the test performed? Unable to perform, virtual appointment   Cognitive Function:    12/29/2017    9:30 AM  MMSE - Mini Mental State Exam  Orientation to time 5  Orientation to Place 5  Registration 3  Attention/ Calculation 5  Recall 3  Language- name 2 objects 2  Language- repeat 1  Language- follow 3 step command 3  Language- read & follow direction 1  Write a sentence 1  Copy design 1  Total score 30        06/05/2022    2:21 PM 08/17/2019    9:31 AM 12/29/2017    9:31 AM  6CIT Screen  What Year? 0 points 0 points 0 points  What month? 0 points 0 points 0 points  What time? 0 points 0 points 0 points  Count back from 20 0 points 0 points 0 points  Months in reverse 0 points 0 points 0 points  Repeat phrase 2 points 0 points 0 points  Total Score 2 points 0 points 0 points    Immunizations Immunization History  Administered Date(s) Administered   Fluad Quad(high Dose 65+) 10/25/2020   Influenza,inj,Quad PF,6+ Mos 10/30/2017   Influenza-Unspecified 11/03/2012, 11/04/2014, 11/03/2016   PFIZER(Purple Top)SARS-COV-2 Vaccination 04/03/2019, 05/03/2019   Pfizer Covid-19 Vaccine Bivalent Booster 12yrs & up 11/22/2020   Pneumococcal Conjugate-13 12/23/2016   Pneumococcal Polysaccharide-23 09/05/2010   Td 11/08/2003    TDAP status: Due, Education has been provided regarding the importance of this vaccine. Advised may receive this vaccine at local pharmacy or Health Dept. Aware to provide a copy of the vaccination record if  obtained from local pharmacy or Health Dept. Verbalized acceptance and understanding.  Flu Vaccine status: Up to date  Pneumococcal vaccine status: Up to date  Covid-19 vaccine status: Information provided on how to obtain vaccines.   Qualifies for Shingles Vaccine? Yes   Zostavax completed No   Shingrix Completed?: No.    Education has been provided regarding the importance of this vaccine. Patient has been advised to call insurance company to determine out of pocket expense if they have not yet received this vaccine. Advised may also receive vaccine at local pharmacy or Health Dept. Verbalized acceptance and understanding.  Screening Tests Health Maintenance  Topic Date Due   DTaP/Tdap/Td (2 - Tdap) 11/07/2013   COVID-19   Vaccine (4 - 2023-24 season) 10/04/2021   Zoster Vaccines- Shingrix (1 of 2) 09/05/2022 (Originally 01/23/1960)   COLONOSCOPY (Pts 45-49yrs Insurance coverage will need to be confirmed)  06/05/2023 (Originally 08/09/2012)   INFLUENZA VACCINE  09/04/2022   Medicare Annual Wellness (AWV)  06/05/2023   Pneumonia Vaccine 65+ Years old  Completed   DEXA SCAN  Completed   HPV VACCINES  Aged Out    Health Maintenance  Health Maintenance Due  Topic Date Due   DTaP/Tdap/Td (2 - Tdap) 11/07/2013   COVID-19 Vaccine (4 - 2023-24 season) 10/04/2021    Colorectal cancer screening: No longer required.   Mammogram status: No longer required due to age.  DEXA Scan:12/15/2019  Lung Cancer Screening: (Low Dose CT Chest recommended if Age 55-80 years, 30 pack-year currently smoking OR have quit w/in 15years.) does not qualify.   Lung Cancer Screening Referral: not applicable   Additional Screening:  Hepatitis C Screening: does not qualify;   Vision Screening: Recommended annual ophthalmology exams for early detection of glaucoma and other disorders of the eye. Is the patient up to date with their annual eye exam?  Yes  Who is the provider or what is the name of the  office in which the patient attends annual eye exams?   If pt is not established with a provider, would they like to be referred to a provider to establish care? No .   Dental Screening: Recommended annual dental exams for proper oral hygiene  Community Resource Referral / Chronic Care Management: CRR required this visit?  No   CCM required this visit?  No      Plan:     I have personally reviewed and noted the following in the patient's chart:   Medical and social history Use of alcohol, tobacco or illicit drugs  Current medications and supplements including opioid prescriptions. Patient is not currently taking opioid prescriptions. Functional ability and status Nutritional status Physical activity Advanced directives List of other physicians Hospitalizations, surgeries, and ER visits in previous 12 months Vitals Screenings to include cognitive, depression, and falls Referrals and appointments  In addition, I have reviewed and discussed with patient certain preventive protocols, quality metrics, and best practice recommendations. A written personalized care plan for preventive services as well as general preventive health recommendations were provided to patient.    Ms. Stmartin , Thank you for taking time to come for your Medicare Wellness Visit. I appreciate your ongoing commitment to your health goals. Please review the following plan we discussed and let me know if I can assist you in the future.   These are the goals we discussed:  Goals      Exercise 3x per week (30 min per time)     Encouraged walking 3x per week in the mornings with her daughter.      Take classes     Patient would like to take art classes at [employer] again     Track BP at home again     Patient has high blood pressure at the doctor, but reports it is low when she checks it at home. She will change the batteries in her home machine and begin tracking it again.        This is a list of the  screening recommended for you and due dates:  Health Maintenance  Topic Date Due   DTaP/Tdap/Td vaccine (2 - Tdap) 11/07/2013   COVID-19 Vaccine (4 - 2023-24 season) 10/04/2021   Zoster (Shingles) Vaccine (1   of 2) 09/05/2022*   Colon Cancer Screening  06/05/2023*   Flu Shot  09/04/2022   Medicare Annual Wellness Visit  06/05/2023   Pneumonia Vaccine  Completed   DEXA scan (bone density measurement)  Completed   HPV Vaccine  Aged Out  *Topic was postponed. The date shown is not the original due date.      Melissa Gibbs, Melissa Gibbs   06/05/2022  Nurse Notes: Approximately 30 minute Non-Face -To-Face Medicare Wellness Visit        

## 2022-06-05 ENCOUNTER — Ambulatory Visit (INDEPENDENT_AMBULATORY_CARE_PROVIDER_SITE_OTHER): Payer: Medicare Other

## 2022-06-05 DIAGNOSIS — Z0000 Encounter for general adult medical examination without abnormal findings: Secondary | ICD-10-CM

## 2022-06-05 NOTE — Progress Notes (Signed)
I connected with  Melissa Gibbs on 06/05/2022 by a audio enabled telemedicine application and verified that I am speaking with the correct person using two identifiers.  Patient Location: Home  Provider Location: Home Office  I discussed the limitations of evaluation and management by telemedicine. The patient expressed understanding and agreed to proceed.   Subjective:   Melissa Gibbs is a 82 y.o. female who presents for Medicare Annual (Subsequent) preventive examination.  Review of Systems    Per HPI unless specifically indicated below.  Cardiac Risk Factors include: advanced age (>109men, >64 women);female gender, Essential Hypertension, CAD, and Hyperlipidemia.          Objective:       04/29/2022    8:57 AM 04/29/2022    8:37 AM 04/04/2022    9:43 AM  Vitals with BMI  Height  5\' 0"    Weight  158 lbs 13 oz   BMI  31.01   Systolic 148 168 096  Diastolic 81 78 70  Pulse 58 58 65    There were no vitals filed for this visit. There is no height or weight on file to calculate BMI.     06/05/2022    2:31 PM 04/29/2022    8:38 AM 04/04/2022   12:02 PM 03/18/2022    9:27 AM 10/01/2021    9:30 AM 08/05/2021    8:59 AM 07/10/2021   10:53 AM  Advanced Directives  Does Patient Have a Medical Advance Directive? No No No No Yes No No  Type of Agricultural consultant;Living will    Does patient want to make changes to medical advance directive?     No - Patient declined    Copy of Healthcare Power of Attorney in Chart?     No - copy requested    Would patient like information on creating a medical advance directive? No - Patient declined   No - Patient declined  No - Patient declined No - Patient declined    Current Medications (verified) Outpatient Encounter Medications as of 06/05/2022  Medication Sig   alendronate (FOSAMAX) 10 MG tablet TAKE 1 TABLET BY MOUTH DAILY  BEFORE BREAKFAST WITH A FULL  GLASS OF WATER ON AN EMPTY  STOMACH   atorvastatin  (LIPITOR) 80 MG tablet TAKE 1 TABLET BY MOUTH DAILY AT  6 PM   carvedilol (COREG) 6.25 MG tablet Take 1 tablet (6.25 mg total) by mouth 2 (two) times daily with a meal.   Cholecalciferol 10 MCG (400 UNIT) CAPS Take 2 capsules (800 Units total) by mouth daily.   clopidogrel (PLAVIX) 75 MG tablet TAKE 1 TABLET BY MOUTH DAILY   estradiol (ESTRACE) 0.1 MG/GM vaginal cream Apply 1/2 inch nightly for 2 weeks followed by twice a week thereafter   irbesartan (AVAPRO) 300 MG tablet TAKE 1 TABLET BY MOUTH DAILY   nitroGLYCERIN (NITROSTAT) 0.4 MG SL tablet Place 1 tablet (0.4 mg total) under the tongue every 5 (five) minutes for 3 doses as needed for chest pain.   No facility-administered encounter medications on file as of 06/05/2022.    Allergies (verified) Amoxicillin   History: Past Medical History:  Diagnosis Date   ACS (acute coronary syndrome) (HCC) 11/20/2017   CAD (coronary artery disease) 11/20/2017   a. Dx STEMI on 11/20/2017 s/p staged PCI with DES to pLCx and DES to mid LAD   Cataract    Hyperlipidemia    Hypertension  Ischemic cardiomyopathy    a. Echo 11/22/2017 w/ LVEF of 35-40%, hypokinesis of basal and mid inferolateral, inferior, and apical inferior and lateral walls, and G1DD   Myocardial infarction (HCC)    PAD (peripheral artery disease) (HCC)    a. Right radial artery noted to be occluded during heart catheterization in 11/2017.   Paroxysmal atrial fibrillation in setting of acute MI    a. Brief episode of atrial fibrillation in setting of acute STEMI - no chronic anticoagulation needed unless recurrent episodes   Shingles 2000   R flank, mild, self reported, did not present to care    Skin cancer    Past Surgical History:  Procedure Laterality Date   CATARACT EXTRACTION, BILATERAL     CORONARY STENT INTERVENTION N/A 11/20/2017   Procedure: CORONARY STENT INTERVENTION;  Surgeon: Tonny Bollman, MD;  Location: [facility] INVASIVE CV LAB;  Service: Cardiovascular;   Laterality: N/A;   CORONARY STENT INTERVENTION N/A 11/23/2017   Procedure: CORONARY STENT INTERVENTION;  Surgeon: Kathleene Hazel, MD;  Location: [facility] INVASIVE CV LAB;  Service: Cardiovascular;  Laterality: N/A;   CORONARY/GRAFT ACUTE MI REVASCULARIZATION N/A 11/20/2017   Procedure: Coronary/Graft Acute MI Revascularization;  Surgeon: Tonny Bollman, MD;  Location: [facility] INVASIVE CV LAB;  Service: Cardiovascular;  Laterality: N/A;   LEFT HEART CATH AND CORONARY ANGIOGRAPHY N/A 11/20/2017   Procedure: LEFT HEART CATH AND CORONARY ANGIOGRAPHY;  Surgeon: Tonny Bollman, MD;  Location: [facility] INVASIVE CV LAB;  Service: Cardiovascular;  Laterality: N/A;   SKIN CANCER EXCISION  2014   Sees [facility] regularly   VAGINAL HYSTERECTOMY     fibroids    Family History  Problem Relation Age of Onset   Heart disease Mother 63       MI , rheumatic fever as a child, and heart murmur   Cancer Father        knee cancer   Colon cancer Father    Hypertension Father    Cancer Sister        melanoma - died at 80   Melanoma Sister    Heart disease Brother    Melanoma Other        died at age 22   Cancer Sister    Lung cancer Sister    Stroke Neg Hx    Social History   Socioeconomic History   Marital status: Divorced    Spouse name: Not on file   Number of children: 1   Years of education: 14   Highest education level: Associate degree: academic program  Occupational History   Occupation: retired    Associate Professor: [employer]    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 Patients daughter helps her around her home and comes over often, Melissa Gibbs.    Patient enjoys hard work, playing with her animals, crafts, and spending time with her daughter.    Social Determinants of Health   Financial  Resource Strain: Low Risk  (06/05/2022)   Overall Financial Resource Strain (CARDIA)    Difficulty of Paying Living Expenses: Not hard at all  Food Insecurity: No Food Insecurity (06/05/2022)   Hunger Vital Sign    Worried About Running Out of Food in the Last Year: Never true    Ran Out of Food in the Last Year: Never true  Transportation Needs: No Transportation Needs (06/05/2022)   PRAPARE - Administrator, Civil Service (Medical): No    Lack of Transportation (Non-Medical): No  Physical Activity: Inactive (06/05/2022)   Exercise Vital Sign    Days of Exercise per Week: 0 days    Minutes of Exercise per Session: 0 min  Stress: Stress Concern Present (06/05/2022)   [employer] of Occupational Health - Occupational Stress Questionnaire    Feeling of Stress : To some extent  Social Connections: Moderately Integrated (06/05/2022)   Social Connection and Isolation Panel [NHANES]    Frequency of Communication with Friends and Family: More than three times a week    Frequency of Social Gatherings with Friends and Family: Twice a week    Attends Religious Services: 1 to 4 times per year    Active Member of [employer] or Organizations: Yes    Attends Banker Meetings: Never    Marital Status: Divorced    Tobacco Counseling Counseling given: No Tobacco comments: quit smoking at age 66   Clinical Intake:  Pre-visit preparation completed: No  Pain : No/denies pain     Nutritional Status: BMI of 19-24  Normal Nutritional Risks: None Diabetes: No  How often do you need to have someone help you when you read instructions, pamphlets, or other written materials from your doctor or pharmacy?: 1 -  Never  Diabetic?No  Interpreter Needed?: No  Information entered by :: Laurel Dimmer, CMA   Activities of Daily Living    06/05/2022    2:14 PM  In your present state of health, do you have any difficulty performing the following activities:  Hearing? 0  Vision? 1  Comment Blurry vision left eye  Difficulty concentrating or making decisions? 1  Walking or climbing stairs? 1  Dressing or bathing? 0  Doing errands, shopping? 0    Patient Care Team: Erick Alley, DO as PCP - General (Family Medicine) Tonny Bollman, MD as PCP - Cardiology (Cardiology) Bufford Buttner, MD as Referring Physician (Dermatology) Kennon Rounds as Physician Assistant (Cardiology)  Indicate any recent Medical Services you may have received from other than [facility] providers in the past year (date may be approximate).     Assessment:   This is a routine wellness examination for [facility].   Hearing/Vision screen Denies any hearing issues. Denies any change to her vision. Wear glasses. Overdue for Annual Eye Exam.   Dietary issues and exercise activities discussed: Current Exercise Habits: The patient does not participate in regular exercise at present, Exercise limited by: None identified   Goals Addressed   None   Depression Screen    04/29/2022    8:39 AM 04/04/2022    9:01 AM 03/18/2022    9:27 AM 08/05/2021    9:00 AM 07/10/2021   10:52 AM 01/02/2021    3:52 PM 12/20/2020    9:16 AM  PHQ 2/9 Scores  PHQ - 2 Score  2 0 0 1 0 1  PHQ- 9 Score  3 1 0 2 1 2   Exception Documentation Patient refusal  Fall Risk    06/05/2022    2:12 PM 04/04/2022   12:02 PM 03/18/2022    9:28 AM 08/05/2021    9:01 AM 07/10/2021   10:53 AM  Fall Risk   Falls in the past year? 1 0 1 0 0  Number falls in past yr: 0  0  0  Injury with Fall? 1  1  0  Risk for fall due to : Other (Comment)      Follow up Falls evaluation completed        FALL RISK PREVENTION PERTAINING TO THE HOME:  Any stairs in or  around the home? No  If so, are there any without handrails? No  Home Gibbs of loose throw rugs in walkways, pet beds, electrical cords, etc? No  Adequate lighting in your home to reduce risk of falls? Yes   ASSISTIVE DEVICES UTILIZED TO PREVENT FALLS:  Life alert? No  Use of a cane, walker or w/c? No  Grab bars in the bathroom? No  Shower chair or bench in shower? No  Elevated toilet seat or a handicapped toilet? No   TIMED UP AND GO:  Was the test performed? Unable to perform, virtual appointment   Cognitive Function:    12/29/2017    9:30 AM  MMSE - Mini Mental State Exam  Orientation to time 5  Orientation to Place 5  Registration 3  Attention/ Calculation 5  Recall 3  Language- name 2 objects 2  Language- repeat 1  Language- follow 3 step command 3  Language- read & follow direction 1  Write a sentence 1  Copy design 1  Total score 30        06/05/2022    2:21 PM 08/17/2019    9:31 AM 12/29/2017    9:31 AM  6CIT Screen  What Year? 0 points 0 points 0 points  What month? 0 points 0 points 0 points  What time? 0 points 0 points 0 points  Count back from 20 0 points 0 points 0 points  Months in reverse 0 points 0 points 0 points  Repeat phrase 2 points 0 points 0 points  Total Score 2 points 0 points 0 points    Immunizations Immunization History  Administered Date(s) Administered   Fluad Quad(high Dose 65+) 10/25/2020   Influenza,inj,Quad PF,6+ Mos 10/30/2017   Influenza-Unspecified 11/03/2012, 11/04/2014, 11/03/2016   PFIZER(Purple Top)SARS-COV-2 Vaccination 04/03/2019, 05/03/2019   Pfizer Covid-19 Vaccine Bivalent Booster 25yrs & up 11/22/2020   Pneumococcal Conjugate-13 12/23/2016   Pneumococcal Polysaccharide-23 09/05/2010   Td 11/08/2003    TDAP status: Due, Education has been provided regarding the importance of this vaccine. Advised may receive this vaccine at local pharmacy or Health Dept. Aware to provide a copy of the vaccination record if  obtained from local pharmacy or Health Dept. Verbalized acceptance and understanding.  Flu Vaccine status: Up to date  Pneumococcal vaccine status: Up to date  Covid-19 vaccine status: Information provided on how to obtain vaccines.   Qualifies for Shingles Vaccine? Yes   Zostavax completed No   Shingrix Completed?: No.    Education has been provided regarding the importance of this vaccine. Patient has been advised to call insurance company to determine out of pocket expense if they have not yet received this vaccine. Advised may also receive vaccine at local pharmacy or Health Dept. Verbalized acceptance and understanding.  Screening Tests Health Maintenance  Topic Date Due   DTaP/Tdap/Td (2 - Tdap) 11/07/2013   COVID-19  Vaccine (4 - 2023-24 season) 10/04/2021   Zoster Vaccines- Shingrix (1 of 2) 09/05/2022 (Originally 01/23/1960)   COLONOSCOPY (Pts 45-85yrs Insurance coverage will need to be confirmed)  06/05/2023 (Originally 08/09/2012)   INFLUENZA VACCINE  09/04/2022   Medicare Annual Wellness (AWV)  06/05/2023   Pneumonia Vaccine 60+ Years old  Completed   DEXA SCAN  Completed   HPV VACCINES  Aged Out    Health Maintenance  Health Maintenance Due  Topic Date Due   DTaP/Tdap/Td (2 - Tdap) 11/07/2013   COVID-19 Vaccine (4 - 2023-24 season) 10/04/2021    Colorectal cancer screening: No longer required.   Mammogram status: No longer required due to age.  DEXA Scan:12/15/2019  Lung Cancer Screening: (Low Dose CT Chest recommended if Age 36-80 years, 30 pack-year currently smoking OR have quit w/in 15years.) does not qualify.   Lung Cancer Screening Referral: not applicable   Additional Screening:  Hepatitis C Screening: does not qualify;   Vision Screening: Recommended annual ophthalmology exams for early detection of glaucoma and other disorders of the eye. Is the patient up to date with their annual eye exam?  Yes  Who is the provider or what is the name of the  office in which the patient attends annual eye exams?   If pt is not established with a provider, would they like to be referred to a provider to establish care? No .   Dental Screening: Recommended annual dental exams for proper oral hygiene  Community Resource Referral / Chronic Care Management: CRR required this visit?  No   CCM required this visit?  No      Plan:     I have personally reviewed and noted the following in the patient's chart:   Medical and social history Use of alcohol, tobacco or illicit drugs  Current medications and supplements including opioid prescriptions. Patient is not currently taking opioid prescriptions. Functional ability and status Nutritional status Physical activity Advanced directives List of other physicians Hospitalizations, surgeries, and ER visits in previous 12 months Vitals Screenings to include cognitive, depression, and falls Referrals and appointments  In addition, I have reviewed and discussed with patient certain preventive protocols, quality metrics, and best practice recommendations. A written personalized care plan for preventive services as well as general preventive health recommendations were provided to patient.    Melissa Gibbs , Thank you for taking time to come for your Medicare Wellness Visit. I appreciate your ongoing commitment to your health goals. Please review the following plan we discussed and let me know if I can assist you in the future.   These are the goals we discussed:  Goals      Exercise 3x per week (30 min per time)     Encouraged walking 3x per week in the mornings with her daughter.      Take classes     Patient would like to take art classes at [facility] again     Track BP at home again     Patient has high blood pressure at the doctor, but reports it is low when she checks it at home. She will change the batteries in her home machine and begin tracking it again.        This is a list of the  screening recommended for you and due dates:  Health Maintenance  Topic Date Due   DTaP/Tdap/Td vaccine (2 - Tdap) 11/07/2013   COVID-19 Vaccine (4 - 2023-24 season) 10/04/2021   Zoster (Shingles) Vaccine (1  of 2) 09/05/2022*   Colon Cancer Screening  06/05/2023*   Flu Shot  09/04/2022   Medicare Annual Wellness Visit  06/05/2023   Pneumonia Vaccine  Completed   DEXA scan (bone density measurement)  Completed   HPV Vaccine  Aged Out  *Topic was postponed. The date shown is not the original due date.      Lonna Cobb, CMA   06/05/2022  Nurse Notes: Approximately 30 minute Non-Face -To-Face Medicare Wellness Visit

## 2022-06-06 NOTE — Progress Notes (Signed)
I have reviewed this visit and agree with the documentation.   

## 2022-06-09 NOTE — Progress Notes (Unsigned)
[facility]   Subjective:     Chief Complaint: No chief complaint on file.  History of Present Illness: BOBBI CLAES is a 82 y.o. female with stage II pelvic organ prolapse who presents for a pessary check. She is using a size 2 1/5in long stem gellhorn pessary. The pessary has been working well and she has no complaints. She {ACTION; IS/IS YNW:29562130} using vaginal estrogen. She denies vaginal bleeding.  Past Medical History: Patient  has a past medical history of ACS (acute coronary syndrome) (HCC) (11/20/2017), CAD (coronary artery disease) (11/20/2017), Cataract, Hyperlipidemia, Hypertension, Ischemic cardiomyopathy, Myocardial infarction [facility]), PAD (peripheral artery disease) (HCC), Paroxysmal atrial fibrillation in setting of acute MI, Shingles (2000), and Skin cancer.   Past Surgical History: She  has a past surgical history that includes Vaginal hysterectomy; Skin cancer excision (2014); Coronary/Graft Acute MI Revascularization (N/A, 11/20/2017); LEFT HEART CATH AND CORONARY ANGIOGRAPHY (N/A, 11/20/2017); CORONARY STENT INTERVENTION (N/A, 11/20/2017); CORONARY STENT INTERVENTION (N/A, 11/23/2017); and Cataract extraction, bilateral.   Medications: She has a current medication list which includes the following prescription(s): alendronate, atorvastatin, carvedilol, cholecalciferol, clopidogrel, estradiol, irbesartan, and nitroglycerin.   Allergies: Patient is allergic to amoxicillin.   Social History: Patient  reports that she has quit smoking. Her smoking use included cigarettes. She has never used smokeless tobacco. She reports that she does not currently use alcohol. She reports that she does not use drugs.      Objective:    Physical Exam: There were no vitals taken for this visit. Gen: No apparent distress, A&O x 3. Detailed Urogynecologic Evaluation:  Pelvic Exam: Normal external female genitalia; Bartholin's and Skene's glands normal in appearance;  urethral meatus {urethra:24773}, no urethral masses or discharge. The pessary was noted to be {in place:24774}. It was removed and cleaned. Speculum exam revealed {vaginal lesions:24775} in the vagina. The pessary was replaced. It was comfortable to the patient and fit well.       No data to display          Laboratory Results: Urine dipstick shows: {ua dip:315374::"negative for all components"}.    Assessment/Plan:    Assessment: Ms. Hoole is a 82 y.o. with stage II pelvic organ prolapse here for a pessary check. She is doing well.  Plan: She will {pessary plan:24776}. She will continue to use {lubricant:24777}. She will follow-up in *** {days/wks/mos/yrs:310907} for a pessary check or sooner as needed.  All questions were answered.   Time Spent:

## 2022-06-10 ENCOUNTER — Encounter: Payer: Self-pay | Admitting: Obstetrics and Gynecology

## 2022-06-10 ENCOUNTER — Ambulatory Visit: Payer: Medicare Other | Admitting: Obstetrics and Gynecology

## 2022-06-10 VITALS — BP 161/82 | HR 53

## 2022-06-10 DIAGNOSIS — N8110 Cystocele, unspecified: Secondary | ICD-10-CM

## 2022-06-10 DIAGNOSIS — Z4689 Encounter for fitting and adjustment of other specified devices: Secondary | ICD-10-CM

## 2022-06-10 DIAGNOSIS — I10 Essential (primary) hypertension: Secondary | ICD-10-CM

## 2022-06-10 NOTE — Patient Instructions (Addendum)
Find out when your cardiology apt is and then you can let us know if you want to move your appointment.   If you do want to explore surgical options then we would need to have cardiac clearance and we would need to do a bladder test to determine if you empty your bladder well.   Your blood pressure was high today. Please check it at home and if it is still elevated contact your PCP.

## 2022-06-12 ENCOUNTER — Ambulatory Visit: Payer: Medicare Other | Admitting: Obstetrics and Gynecology

## 2022-06-16 ENCOUNTER — Other Ambulatory Visit: Payer: Self-pay | Admitting: Cardiovascular Disease

## 2022-08-10 ENCOUNTER — Other Ambulatory Visit: Payer: Self-pay | Admitting: Cardiovascular Disease

## 2022-08-27 ENCOUNTER — Other Ambulatory Visit: Payer: Self-pay | Admitting: Cardiovascular Disease

## 2022-08-27 ENCOUNTER — Other Ambulatory Visit: Payer: Self-pay | Admitting: Student

## 2022-09-11 ENCOUNTER — Ambulatory Visit: Payer: Medicare Other | Admitting: Obstetrics and Gynecology

## 2022-09-14 NOTE — Progress Notes (Unsigned)
Cardiology Office Note:    Date:  09/15/2022  ID:  Melissa Gibbs, DOB 1940/08/26, MRN [id number] PCP: Erick Alley, DO  [facility] Providers Cardiologist:  Tonny Bollman, MD Cardiology APP:  Beatrice Lecher, PA-C       Patient Profile:      Coronary artery disease Inferoposterior STEMI 10/19: 4 x 15 mm DES to the LCx; staged 2.75 x 18 mm DES to the LAD LHCx 11/20/17: pLAD 80, mLAD 80, oLCx 100, pRCA 60, EF 45 Complicated by atrial fibrillation Ischemic cardiomyopathy w return of normal LVF EF 35-40 in 11/2017 TTE 04/01/18: EF 60-65, mild LVH, grade 1 diastolic dysfunction, mild aortic valve sclerosis, trivial AI  Hypertension Hyperlipidemia Peripheral Arterial Disease             Discussed the use of AI scribe software for clinical note transcription with the patient, who gave verbal consent to proceed. History of Present Illness   The patient, an 82 year old female with a history of coronary artery disease, presents for her annual follow-up of CAD.   The patient reports feeling generally well, heart-wise, but experiences some shortness of breath with activities. She attributes this to aging and high stress levels. She does not report any chest pain, pressure, or tightness. She is able to climb a flight of stairs without difficulty and does not need to prop up on pillows to breathe better at night. She denies any swelling in her legs or any symptoms reminiscent of her heart attack. She does not smoke and does not experience leg pain when walking.     ROS: See HPI    Studies Reviewed:   EKG Interpretation Date/Time:  Monday September 15 2022 09:08:36 EDT Ventricular Rate:  70 PR Interval:  162 QRS Duration:  80 QT Interval:  398 QTC Calculation: 429 R Axis:   5  Text Interpretation: Normal sinus rhythm Low voltage QRS Cannot rule out Anterior infarct No significant change was found Confirmed by Tereso Newcomer [phone]) on 09/15/2022 9:31:02 AM    Risk  Assessment/Calculations:             Physical Exam:   VS:  BP 128/62   Pulse 70   Ht 5' (1.524 m)   Wt 154 lb 12.8 oz (70.2 kg)   SpO2 96%   BMI 30.23 kg/m    Wt Readings from Last 3 Encounters:  09/15/22 154 lb 12.8 oz (70.2 kg)  04/29/22 158 lb 12.8 oz (72 kg)  04/04/22 159 lb 6.4 oz (72.3 kg)    Constitutional:      Appearance: Healthy appearance. Not in distress.  Neck:     Vascular: JVD normal.  Pulmonary:     Breath sounds: Normal breath sounds. No wheezing. No rales.  Cardiovascular:     Normal rate. Regular rhythm. Normal S1. Normal S2.      Murmurs: There is a grade 2/6 systolic murmur at the URSB.  Edema:    Peripheral edema absent.  Abdominal:     Palpations: Abdomen is soft.      Assessment and Plan:  Coronary artery disease involving native coronary artery of native heart without angina pectoris History of infero-posterior STEMI in 2019 tx with DES to LCX and staged DES to LAD. She is doing well w/o anginal symptoms. -Continue Plavix 75mg  daily and Lipitor 80mg  daily. -Follow up 1 year.  Ischemic cardiomyopathy EF improved to 60-65% by echocardiogram in February 2020. No current symptoms of heart failure. -Continue Irbesartan 300mg  daily and  Carvedilol 6.25mg  twice daily.  Hyperlipidemia LDL goal <70 LDL slightly above goal at 68 in August 2023. -Continue Lipitor 80mg  daily. -Obtain CMET and direct LDL today.  Essential hypertension Well controlled. Reports occasional lightheadedness. -Continue Irbesartan 300mg  daily and Carvedilol 6.25mg  twice daily. -Consider decreasing Irbesartan to 150mg  daily if blood pressure readings are consistently low. -Patient to send blood pressure readings for review.     Dispo:  Return in about 1 year (around 09/15/2023) for Routine Follow Up, w/ Dr. Excell Seltzer, or Tereso Newcomer, PA-C.  Signed, Tereso Newcomer, PA-C

## 2022-09-15 ENCOUNTER — Encounter: Payer: Self-pay | Admitting: Physician Assistant

## 2022-09-15 ENCOUNTER — Ambulatory Visit: Payer: Medicare Other | Attending: Physician Assistant | Admitting: Physician Assistant

## 2022-09-15 VITALS — BP 128/62 | HR 70 | Ht 60.0 in | Wt 154.8 lb

## 2022-09-15 DIAGNOSIS — I10 Essential (primary) hypertension: Secondary | ICD-10-CM

## 2022-09-15 DIAGNOSIS — I2510 Atherosclerotic heart disease of native coronary artery without angina pectoris: Secondary | ICD-10-CM | POA: Diagnosis not present

## 2022-09-15 DIAGNOSIS — I255 Ischemic cardiomyopathy: Secondary | ICD-10-CM | POA: Diagnosis not present

## 2022-09-15 DIAGNOSIS — E785 Hyperlipidemia, unspecified: Secondary | ICD-10-CM | POA: Diagnosis not present

## 2022-09-15 LAB — COMPREHENSIVE METABOLIC PANEL
ALT: 11 IU/L (ref 0–32)
AST: 20 IU/L (ref 0–40)
Albumin: 4.3 g/dL (ref 3.7–4.7)
Alkaline Phosphatase: 78 IU/L (ref 44–121)
BUN/Creatinine Ratio: 12 (ref 12–28)
BUN: 11 mg/dL (ref 8–27)
Bilirubin Total: 0.5 mg/dL (ref 0.0–1.2)
CO2: 27 mmol/L (ref 20–29)
Calcium: 9.5 mg/dL (ref 8.7–10.3)
Chloride: 103 mmol/L (ref 96–106)
Creatinine, Ser: 0.91 mg/dL (ref 0.57–1.00)
Globulin, Total: 2.8 g/dL (ref 1.5–4.5)
Glucose: 121 mg/dL — ABNORMAL HIGH (ref 70–99)
Potassium: 3.5 mmol/L (ref 3.5–5.2)
Sodium: 143 mmol/L (ref 134–144)
Total Protein: 7.1 g/dL (ref 6.0–8.5)
eGFR: 63 mL/min/{1.73_m2} (ref >59)

## 2022-09-15 LAB — LDL CHOLESTEROL, DIRECT: LDL Direct: 60 mg/dL (ref 0–99)

## 2022-09-15 NOTE — Assessment & Plan Note (Signed)
Well controlled. Reports occasional lightheadedness. -Continue Irbesartan 300mg  daily and Carvedilol 6.25mg  twice daily. -Consider decreasing Irbesartan to 150mg  daily if blood pressure readings are consistently low. -Patient to send blood pressure readings for review.

## 2022-09-15 NOTE — Assessment & Plan Note (Signed)
History of infero-posterior STEMI in 2019 tx with DES to LCX and staged DES to LAD. She is doing well w/o anginal symptoms. -Continue Plavix 75mg  daily and Lipitor 80mg  daily. -Follow up 1 year.

## 2022-09-15 NOTE — Assessment & Plan Note (Signed)
EF improved to 60-65% by echocardiogram in February 2020. No current symptoms of heart failure. -Continue Irbesartan 300mg  daily and Carvedilol 6.25mg  twice daily.

## 2022-09-15 NOTE — Patient Instructions (Signed)
Medication Instructions:  Your physician recommends that you continue on your current medications as directed. Please refer to the Current Medication list given to you today.  *If you need a refill on your cardiac medications before your next appointment, please call your pharmacy*  Lab Work: CMET, DIRECT LDL-TODAY If you have labs (blood work) drawn today and your tests are completely normal, you will receive your results only by: MyChart Message (if you have MyChart) OR A paper copy in the mail If you have any lab test that is abnormal or we need to change your treatment, we will call you to review the results.  Follow-Up: At [facility], you and your health needs are our priority.  As part of our continuing mission to provide you with exceptional heart care, we have created designated Provider Care Teams.  These Care Teams include your primary Cardiologist (physician) and Advanced Practice Providers (APPs -  Physician Assistants and Nurse Practitioners) who all work together to provide you with the care you need, when you need it.  Your next appointment:   1 year(s)  Provider:   Tonny Bollman, MD  or Tereso Newcomer, PA-C

## 2022-09-15 NOTE — Assessment & Plan Note (Signed)
LDL slightly above goal at 68 in August 2023. -Continue Lipitor 80mg  daily. -Obtain CMET and direct LDL today.

## 2022-09-18 ENCOUNTER — Ambulatory Visit: Payer: Medicare Other | Admitting: Obstetrics and Gynecology

## 2022-09-19 ENCOUNTER — Other Ambulatory Visit: Payer: Self-pay | Admitting: Student

## 2022-09-25 DIAGNOSIS — D1801 Hemangioma of skin and subcutaneous tissue: Secondary | ICD-10-CM | POA: Diagnosis not present

## 2022-09-25 DIAGNOSIS — S40861A Insect bite (nonvenomous) of right upper arm, initial encounter: Secondary | ICD-10-CM | POA: Diagnosis not present

## 2022-09-25 DIAGNOSIS — L570 Actinic keratosis: Secondary | ICD-10-CM | POA: Diagnosis not present

## 2022-09-25 DIAGNOSIS — L814 Other melanin hyperpigmentation: Secondary | ICD-10-CM | POA: Diagnosis not present

## 2022-09-25 DIAGNOSIS — Z85828 Personal history of other malignant neoplasm of skin: Secondary | ICD-10-CM | POA: Diagnosis not present

## 2022-09-25 DIAGNOSIS — L720 Epidermal cyst: Secondary | ICD-10-CM | POA: Diagnosis not present

## 2022-09-25 DIAGNOSIS — L821 Other seborrheic keratosis: Secondary | ICD-10-CM | POA: Diagnosis not present

## 2022-10-02 ENCOUNTER — Ambulatory Visit: Payer: Medicare Other | Admitting: Obstetrics and Gynecology

## 2022-10-02 ENCOUNTER — Encounter: Payer: Self-pay | Admitting: Obstetrics and Gynecology

## 2022-10-02 VITALS — BP 154/89 | HR 76

## 2022-10-02 DIAGNOSIS — I10 Essential (primary) hypertension: Secondary | ICD-10-CM

## 2022-10-02 DIAGNOSIS — Z4689 Encounter for fitting and adjustment of other specified devices: Secondary | ICD-10-CM

## 2022-10-02 DIAGNOSIS — N8110 Cystocele, unspecified: Secondary | ICD-10-CM

## 2022-10-02 NOTE — Progress Notes (Signed)
[facility]   Subjective:     Chief Complaint:  Chief Complaint  Patient presents with   Pessary Check   History of Present Illness: Melissa Gibbs is a 82 y.o. female with stage II pelvic organ prolapse who presents for a pessary check. She is using a size 2 1/4in long stem gellhorn pessary. The pessary has been working well and she has no complaints. She is using vaginal estrogen. She denies vaginal bleeding.  Past Medical History: Patient  has a past medical history of ACS (acute coronary syndrome) (HCC) (11/20/2017), CAD (coronary artery disease) (11/20/2017), Cataract, Hyperlipidemia, Hypertension, Ischemic cardiomyopathy, Myocardial infarction [facility]), PAD (peripheral artery disease) (HCC), Paroxysmal atrial fibrillation in setting of acute MI, Shingles (2000), and Skin cancer.   Past Surgical History: She  has a past surgical history that includes Vaginal hysterectomy; Skin cancer excision (2014); Coronary/Graft Acute MI Revascularization (N/A, 11/20/2017); LEFT HEART CATH AND CORONARY ANGIOGRAPHY (N/A, 11/20/2017); CORONARY STENT INTERVENTION (N/A, 11/20/2017); CORONARY STENT INTERVENTION (N/A, 11/23/2017); and Cataract extraction, bilateral.   Medications: She has a current medication list which includes the following prescription(s): alendronate, atorvastatin, carvedilol, cholecalciferol, clopidogrel, estradiol, irbesartan, and nitroglycerin.   Allergies: Patient is allergic to amoxicillin.   Social History: Patient  reports that she has quit smoking. Her smoking use included cigarettes. She has never used smokeless tobacco. She reports that she does not currently use alcohol. She reports that she does not use drugs.      Objective:    Physical Exam: BP (!) 154/89 (BP Location: Left Arm, Patient Position: Sitting, Cuff Size: Normal)   Pulse 76  Gen: No apparent distress, A&O x 3. Detailed Urogynecologic Evaluation:  Pelvic Exam: Normal external female  genitalia; Bartholin's and Skene's glands normal in appearance; urethral meatus normal in appearance, no urethral masses or discharge. The pessary was noted to be in place. It was removed and cleaned. Speculum exam revealed no lesions in the vagina. The pessary was replaced. It was comfortable to the patient and fit well.    Assessment/Plan:    Assessment: Melissa Gibbs is a 82 y.o. with stage II pelvic organ prolapse here for a pessary check. She is doing well.  Plan: She will keep the pessary in place until next visit. She will continue to use estrogen. She will follow-up in 3 months for a pessary check or sooner as needed.   She recently lost her dog and her sister in law had an amputation. She reports she is not in the right frame of mind to consider moving forward with surgery. Will continue with pessary for now.   All questions were answered.

## 2022-10-09 IMAGING — CT CT RENAL STONE PROTOCOL
2 of 4 series · 16 of 46 positions shown, 18 images · non-contrast
Comparison: None.

CLINICAL DATA: 79-year-old female with flank pain.

EXAM:
CT ABDOMEN AND PELVIS WITHOUT CONTRAST
TECHNIQUE: Multidetector CT imaging of the abdomen and pelvis was performed
following the standard protocol without IV contrast.

[Series 3: ap without · axial · non-contrast · 0.85mm/px · z∈[+869,+1244]mm · 13 of 85 slices shown, 15 images]
[im 5/85  soft-tissue]
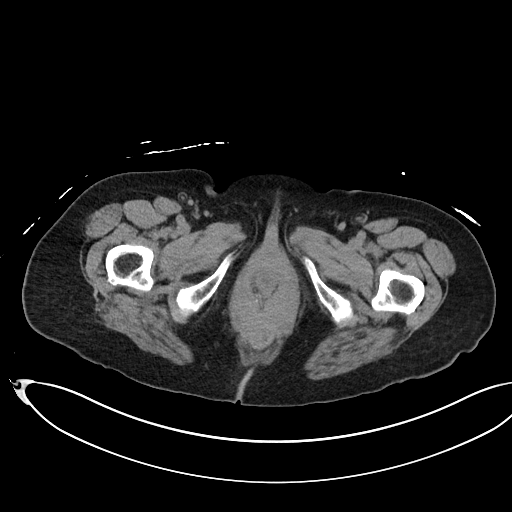
[im 5/85  bone]
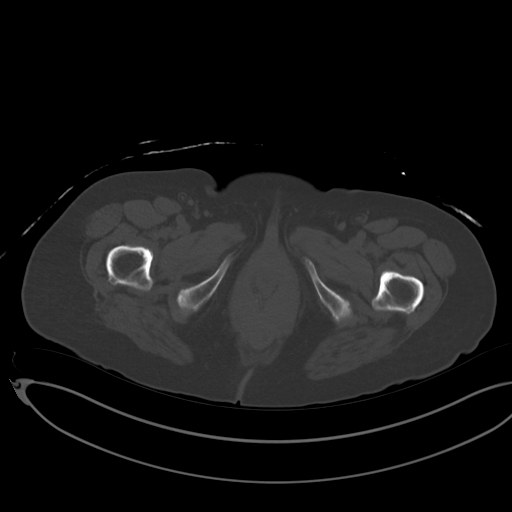
[im 10/85  soft-tissue]
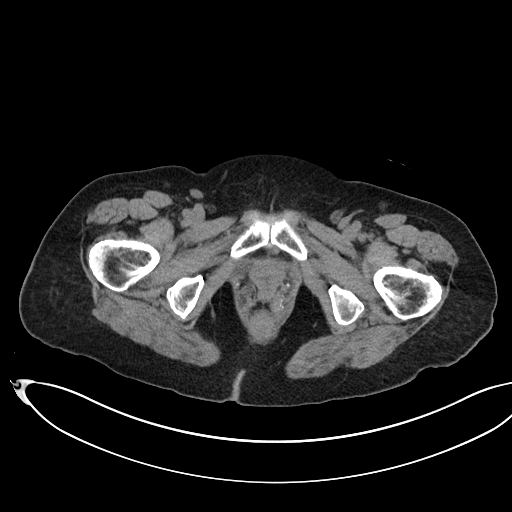
[im 19/85  soft-tissue]
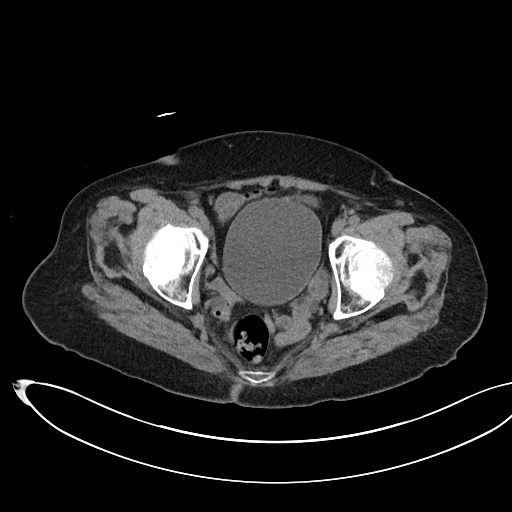
[im 24/85  soft-tissue]
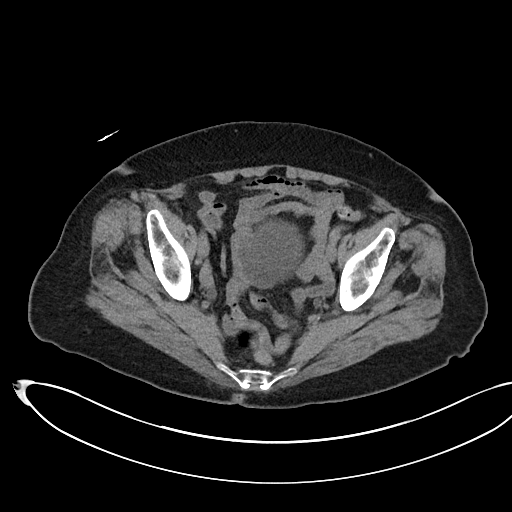
[im 29/85  soft-tissue]
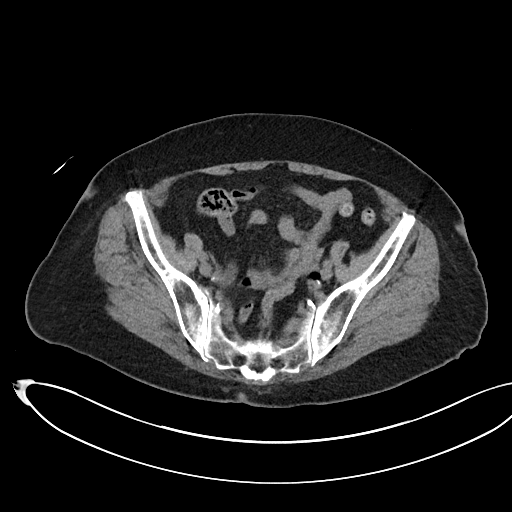
[im 38/85  soft-tissue]
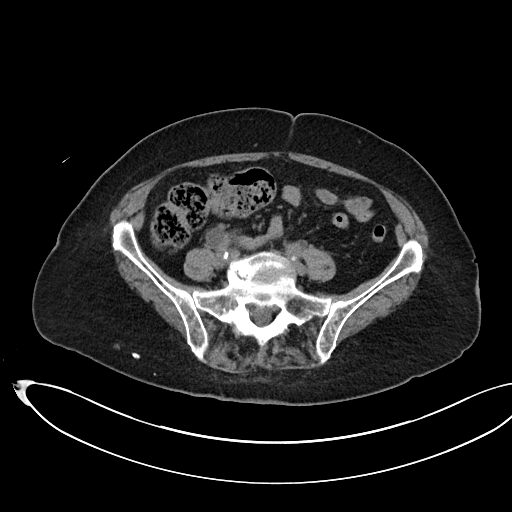
[im 43/85  soft-tissue]
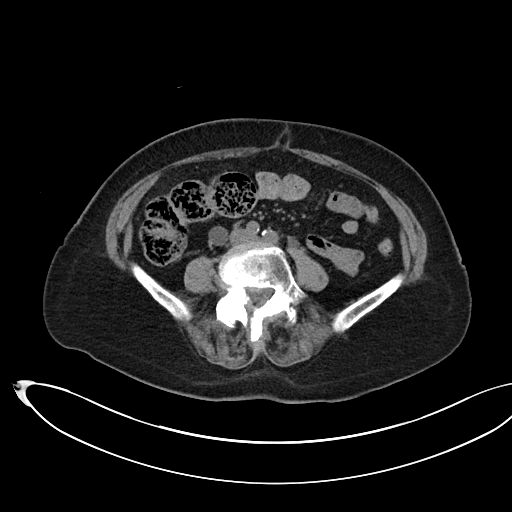
[im 47/85  soft-tissue]
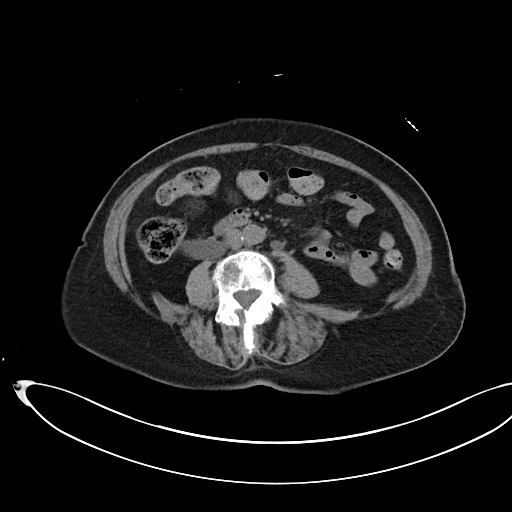
[im 57/85  soft-tissue]
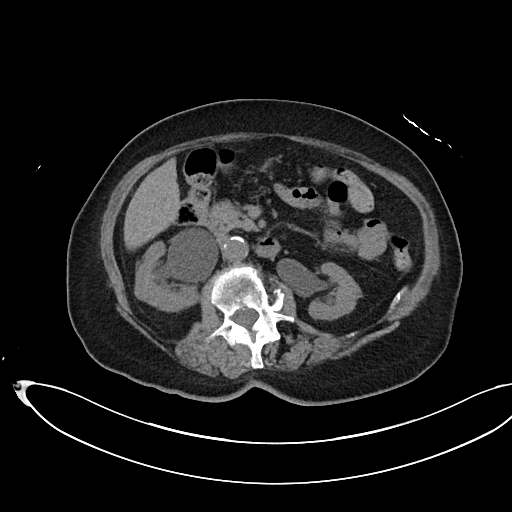
[im 57/85  bone]
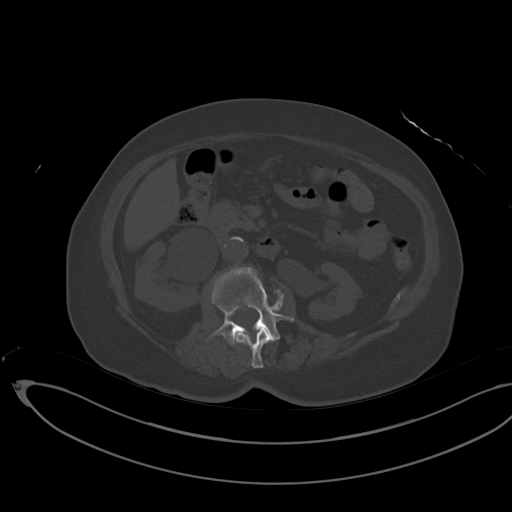
[im 61/85  soft-tissue]
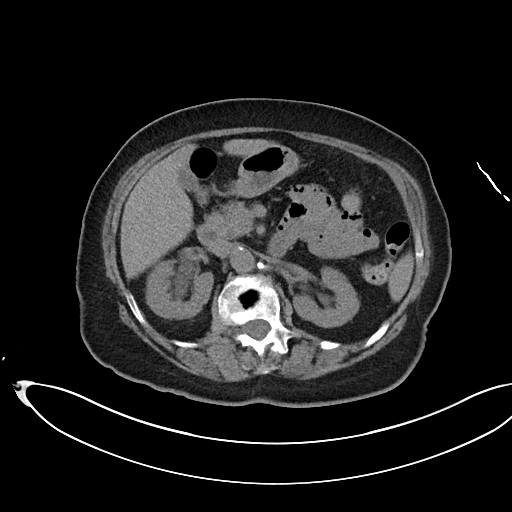
[im 66/85  soft-tissue]
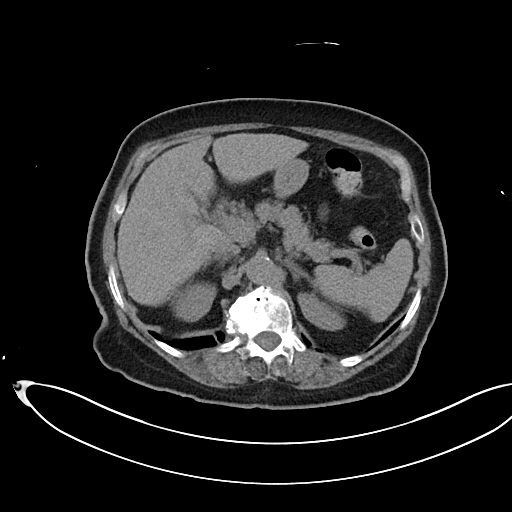
[im 75/85  soft-tissue]
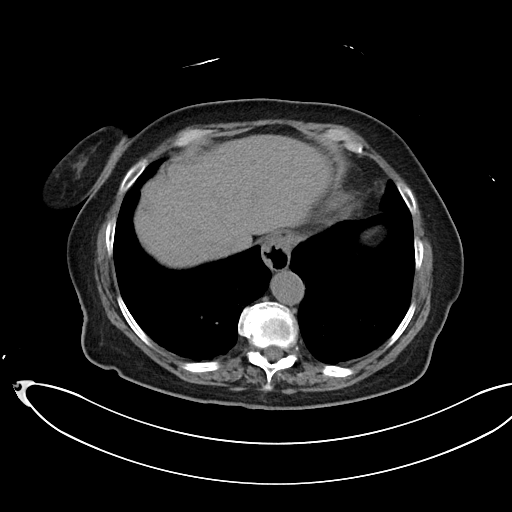
[im 80/85  soft-tissue]
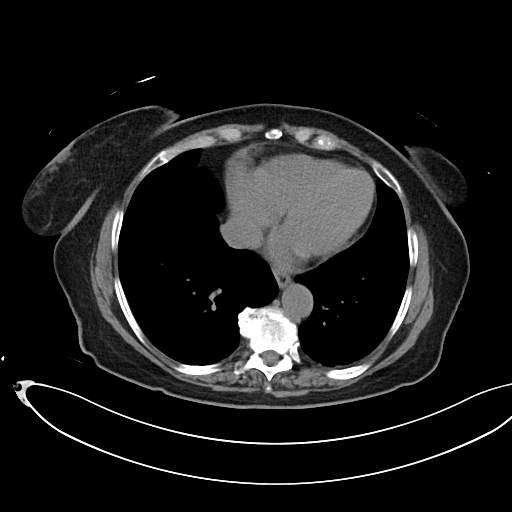

[Series 6: cor · coronal · 0.78mm/px · 3 of 90 slices shown]
[im 30/90  soft-tissue]
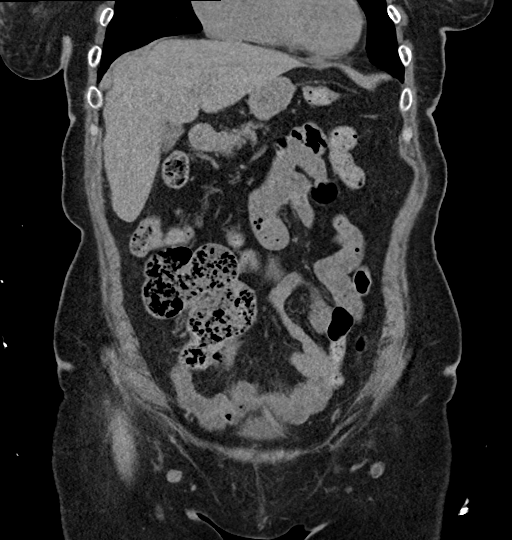
[im 40/90  soft-tissue]
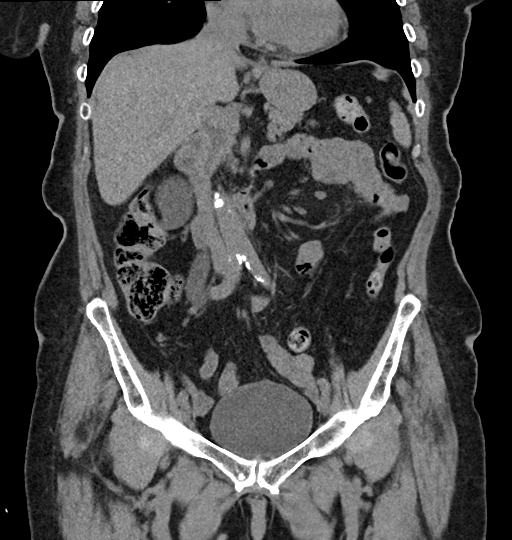
[im 50/90  soft-tissue]
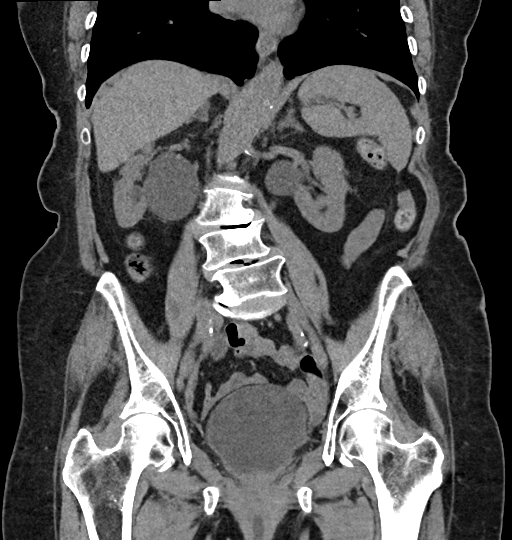

[16 of 46 positions shown; findings below may reference images not displayed]

FINDINGS: Lower chest: Cardiac size at the upper limits of normal. No
pathologic pericardial effusion. Small gastric hiatal hernia. Mild
atelectasis or scarring in the lower lobes and medial segment right
middle lobe. No pleural effusion.

Hepatobiliary: Punctate gallstone. Contracted gallbladder without
inflammation. Negative noncontrast liver.

Pancreas: Negative.

Spleen: Negative; occasional small calcified granulomas within the
spleen.

Adrenals/Urinary Tract: Normal adrenal glands.

Moderate to severe right and mild to moderate left hydronephrosis
and hydroureter. The right ureter is dilated up to 14 mm in the
abdomen. The left ureter is 9 mm. No nephrolithiasis. And both
ureters remain dilated into the pelvis, along the pelvic side wall,
and through the pelvic floor secondary to moderate or severe pelvic
laxity. See coronal image 52, and note the ureters tracking lateral
to the cystocele on coronal images 56 and 57. There are superimposed
pelvic phleboliths, but no convincing distal ureteral calculi.

Superimposed 2 cm urethral diverticulum or un-compressed portion of
the lowercystocele just below the pelvic floor on series 3, image 84
and sagittal image 74.

Stomach/Bowel: No dilated large or small bowel. Redundant sigmoid
and transverse colon segments. Redundant right colon. No large bowel
inflammation. Negative terminal ileum. Elongated but normal appendix
tracking along the right pelvic side wall on coronal image 43. aside
from small hiatal hernia the stomach appears negative. Negative
duodenum. No free air, free fluid or mesenteric inflammation.

Vascular/Lymphatic: Aortoiliac calcified atherosclerosis. Normal
caliber abdominal aorta. No lymphadenopathy.

Reproductive: Evidence of aAbsent uterus. Diminutive or absent
ovaries.

Other: No pelvic free fluid.

Musculoskeletal: Diffuse advanced disc and endplate degeneration.
Widespread vacuum disc. Grade 1 spondylolisthesis at the lumbosacral
junction with facet arthropathy. Superimposed moderate dextroconvex
lumbar scoliosis. No acute osseous abnormality identified.
IMPRESSION: 1. Moderate to severe bilateral ureteral and renal obstruction,
worse on the right, appears secondary to pronounced pelvic floor
laxity and Cystocele compressing the bilateral UVJ, rather than to
stone disease or other obstructing etiology. Recommend Urology
consultation.
2. No other acute or inflammatory process identified in the
noncontrast abdomen and pelvis.
3. Advanced degenerative changes in the spine.
4.  Aortic Atherosclerosis ([id number]).

## 2022-10-19 ENCOUNTER — Other Ambulatory Visit: Payer: Self-pay | Admitting: Cardiovascular Disease

## 2022-11-05 ENCOUNTER — Other Ambulatory Visit: Payer: Self-pay | Admitting: Cardiovascular Disease

## 2022-11-13 ENCOUNTER — Other Ambulatory Visit: Payer: Self-pay | Admitting: Physician Assistant

## 2022-11-27 ENCOUNTER — Other Ambulatory Visit: Payer: Self-pay | Admitting: Cardiovascular Disease

## 2022-11-27 ENCOUNTER — Other Ambulatory Visit (HOSPITAL_COMMUNITY): Payer: Self-pay

## 2022-11-28 ENCOUNTER — Other Ambulatory Visit (HOSPITAL_COMMUNITY): Payer: Self-pay

## 2022-11-28 MED ORDER — NITROGLYCERIN 0.4 MG SL SUBL
0.4000 mg | SUBLINGUAL_TABLET | SUBLINGUAL | 9 refills | Status: AC | PRN
  Filled 2022-11-28: qty 25, 3d supply, fill #0

## 2022-12-30 ENCOUNTER — Ambulatory Visit: Payer: Medicare Other | Admitting: Obstetrics and Gynecology

## 2022-12-30 ENCOUNTER — Encounter: Payer: Self-pay | Admitting: Obstetrics and Gynecology

## 2022-12-30 VITALS — BP 108/69 | HR 82

## 2022-12-30 DIAGNOSIS — N8110 Cystocele, unspecified: Secondary | ICD-10-CM

## 2022-12-30 DIAGNOSIS — N812 Incomplete uterovaginal prolapse: Secondary | ICD-10-CM | POA: Diagnosis not present

## 2022-12-30 NOTE — Progress Notes (Signed)
[facility]   Subjective:     Chief Complaint:  Chief Complaint  Patient presents with   Pessary Check    Melissa Gibbs is a 82 y.o. female is here for pessary check.   History of Present Illness: Melissa Gibbs is a 82 y.o. female with stage II pelvic organ prolapse who presents for a pessary check. She is using a size 2 1/4in long stem gellhorn pessary. The pessary has been working well and she has no complaints. She is using vaginal estrogen. She denies vaginal bleeding.  Past Medical History: Patient  has a past medical history of ACS (acute coronary syndrome) (HCC) (11/20/2017), CAD (coronary artery disease) (11/20/2017), Cataract, Hyperlipidemia, Hypertension, Ischemic cardiomyopathy, Myocardial infarction [facility]), PAD (peripheral artery disease) (HCC), Paroxysmal atrial fibrillation in setting of acute MI, Shingles (2000), and Skin cancer.   Past Surgical History: She  has a past surgical history that includes Vaginal hysterectomy; Skin cancer excision (2014); Coronary/Graft Acute MI Revascularization (N/A, 11/20/2017); LEFT HEART CATH AND CORONARY ANGIOGRAPHY (N/A, 11/20/2017); CORONARY STENT INTERVENTION (N/A, 11/20/2017); CORONARY STENT INTERVENTION (N/A, 11/23/2017); and Cataract extraction, bilateral.   Medications: She has a current medication list which includes the following prescription(s): alendronate, atorvastatin, carvedilol, cholecalciferol, clopidogrel, estradiol, irbesartan, and nitroglycerin.   Allergies: Patient is allergic to amoxicillin.   Social History: Patient  reports that she has quit smoking. Her smoking use included cigarettes. She has never used smokeless tobacco. She reports that she does not currently use alcohol. She reports that she does not use drugs.      Objective:    Physical Exam: BP 108/69   Pulse 82  Gen: No apparent distress, A&O x 3. Detailed Urogynecologic Evaluation:  Pelvic Exam: Normal external female genitalia;  Bartholin's and Skene's glands normal in appearance; urethral meatus normal in appearance, no urethral masses or discharge. The pessary was noted to be in place. It was removed and cleaned. Speculum exam revealed no lesions in the vagina. The pessary was replaced. It was comfortable to the patient and fit well.    Assessment/Plan:    Assessment: Melissa Gibbs is a 82 y.o. with stage II pelvic organ prolapse here for a pessary check. She is doing well.  Plan: She will keep the pessary in place until next visit. She will continue to use estrogen. She will follow-up in 3 months for a pessary check or sooner as needed.   Patient reports she is doing okay with the pessary for now and will let us know if she wanted to move forward with surgery. She would like to avoid it at this point though if she can.   All questions were answered.

## 2023-03-10 ENCOUNTER — Encounter (HOSPITAL_COMMUNITY): Payer: Self-pay | Admitting: Internal Medicine

## 2023-03-10 ENCOUNTER — Telehealth: Payer: Self-pay | Admitting: Cardiovascular Disease

## 2023-03-10 ENCOUNTER — Encounter: Payer: Self-pay | Admitting: Physician Assistant

## 2023-03-10 ENCOUNTER — Encounter (HOSPITAL_COMMUNITY)
Admission: AD | Disposition: A | Discharge status: Home / Self Care | Payer: Self-pay | Source: Ambulatory Visit | Attending: Internal Medicine

## 2023-03-10 ENCOUNTER — Ambulatory Visit: Payer: Medicare Other | Attending: Physician Assistant | Admitting: Physician Assistant

## 2023-03-10 ENCOUNTER — Observation Stay (HOSPITAL_COMMUNITY)
Admission: AD | Admit: 2023-03-10 | Discharge: 2023-03-11 | Disposition: A | Discharge status: Home / Self Care | Payer: Medicare Other | Source: Ambulatory Visit | Attending: Internal Medicine | Admitting: Internal Medicine

## 2023-03-10 ENCOUNTER — Other Ambulatory Visit: Payer: Self-pay

## 2023-03-10 VITALS — BP 170/100 | HR 65 | Ht 62.0 in | Wt 158.8 lb

## 2023-03-10 DIAGNOSIS — I2510 Atherosclerotic heart disease of native coronary artery without angina pectoris: Secondary | ICD-10-CM | POA: Diagnosis not present

## 2023-03-10 DIAGNOSIS — E785 Hyperlipidemia, unspecified: Secondary | ICD-10-CM | POA: Diagnosis not present

## 2023-03-10 DIAGNOSIS — I739 Peripheral vascular disease, unspecified: Secondary | ICD-10-CM | POA: Insufficient documentation

## 2023-03-10 DIAGNOSIS — I10 Essential (primary) hypertension: Secondary | ICD-10-CM

## 2023-03-10 DIAGNOSIS — Z23 Encounter for immunization: Secondary | ICD-10-CM | POA: Diagnosis not present

## 2023-03-10 DIAGNOSIS — I25110 Atherosclerotic heart disease of native coronary artery with unstable angina pectoris: Secondary | ICD-10-CM

## 2023-03-10 DIAGNOSIS — I255 Ischemic cardiomyopathy: Secondary | ICD-10-CM | POA: Diagnosis not present

## 2023-03-10 HISTORY — PX: LEFT HEART CATH AND CORONARY ANGIOGRAPHY: CATH118249

## 2023-03-10 HISTORY — PX: CORONARY PRESSURE/FFR STUDY: CATH118243

## 2023-03-10 LAB — CBC WITH DIFFERENTIAL/PLATELET
Abs Immature Granulocytes: 0.02 10*3/uL (ref 0.00–0.07)
Basophils Absolute: 0.1 10*3/uL (ref 0.0–0.1)
Basophils Relative: 1 %
Eosinophils Absolute: 0.2 10*3/uL (ref 0.0–0.5)
Eosinophils Relative: 2 %
HCT: 35 % — ABNORMAL LOW (ref 36.0–46.0)
Hemoglobin: 11.4 g/dL — ABNORMAL LOW (ref 12.0–15.0)
Immature Granulocytes: 0 %
Lymphocytes Relative: 22 %
Lymphs Abs: 1.8 10*3/uL (ref 0.7–4.0)
MCH: 29.8 pg (ref 26.0–34.0)
MCHC: 32.6 g/dL (ref 30.0–36.0)
MCV: 91.4 fL (ref 80.0–100.0)
Monocytes Absolute: 0.7 10*3/uL (ref 0.1–1.0)
Monocytes Relative: 8 %
Neutro Abs: 5.4 10*3/uL (ref 1.7–7.7)
Neutrophils Relative %: 67 %
Platelets: 263 10*3/uL (ref 150–400)
RBC: 3.83 MIL/uL — ABNORMAL LOW (ref 3.87–5.11)
RDW: 13.3 % (ref 11.5–15.5)
WBC: 8 10*3/uL (ref 4.0–10.5)
nRBC: 0 % (ref 0.0–0.2)

## 2023-03-10 LAB — POCT I-STAT, CHEM 8
BUN: 15 mg/dL (ref 8–23)
Calcium, Ion: 1.22 mmol/L (ref 1.15–1.40)
Chloride: 105 mmol/L (ref 98–111)
Creatinine, Ser: 0.9 mg/dL (ref 0.44–1.00)
Glucose, Bld: 113 mg/dL — ABNORMAL HIGH (ref 70–99)
HCT: 38 % (ref 36.0–46.0)
Hemoglobin: 12.9 g/dL (ref 12.0–15.0)
Potassium: 3.9 mmol/L (ref 3.5–5.1)
Sodium: 142 mmol/L (ref 135–145)
TCO2: 27 mmol/L (ref 22–32)

## 2023-03-10 LAB — BASIC METABOLIC PANEL
Anion gap: 9 (ref 5–15)
BUN: 13 mg/dL (ref 8–23)
CO2: 24 mmol/L (ref 22–32)
Calcium: 9.2 mg/dL (ref 8.9–10.3)
Chloride: 106 mmol/L (ref 98–111)
Creatinine, Ser: 0.84 mg/dL (ref 0.44–1.00)
GFR, Estimated: >60 mL/min (ref >60)
Glucose, Bld: 116 mg/dL — ABNORMAL HIGH (ref 70–99)
Potassium: 3.8 mmol/L (ref 3.5–5.1)
Sodium: 139 mmol/L (ref 135–145)

## 2023-03-10 LAB — TROPONIN I (HIGH SENSITIVITY): Troponin I (High Sensitivity): 7 ng/L (ref <18)

## 2023-03-10 SURGERY — LEFT HEART CATH AND CORONARY ANGIOGRAPHY
Anesthesia: LOCAL

## 2023-03-10 MED ORDER — HEPARIN SODIUM (PORCINE) 1000 UNIT/ML IJ SOLN
INTRAMUSCULAR | Status: AC
  Filled 2023-03-10: qty 10

## 2023-03-10 MED ORDER — ATORVASTATIN CALCIUM 80 MG PO TABS
80.0000 mg | ORAL_TABLET | Freq: Every day | ORAL | Status: DC
  Administered 2023-03-10 – 2023-03-11 (×2): 80 mg via ORAL
  Filled 2023-03-10 (×2): qty 1

## 2023-03-10 MED ORDER — IOHEXOL 350 MG/ML SOLN
INTRAVENOUS | Status: DC | PRN
  Administered 2023-03-10: 80 mL

## 2023-03-10 MED ORDER — INFLUENZA VAC A&B SURF ANT ADJ 0.5 ML IM SUSY
0.5000 mL | PREFILLED_SYRINGE | INTRAMUSCULAR | Status: AC
  Administered 2023-03-11: 0.5 mL via INTRAMUSCULAR
  Filled 2023-03-10: qty 0.5

## 2023-03-10 MED ORDER — LIDOCAINE HCL (PF) 1 % IJ SOLN
INTRAMUSCULAR | Status: DC | PRN
  Administered 2023-03-10: 5 mL via INTRADERMAL

## 2023-03-10 MED ORDER — NITROGLYCERIN 0.4 MG SL SUBL: 0.4000 mg | SUBLINGUAL_TABLET | SUBLINGUAL | Status: DC | PRN

## 2023-03-10 MED ORDER — SODIUM CHLORIDE 0.9 % IV SOLN: INTRAVENOUS | Status: DC

## 2023-03-10 MED ORDER — MIDAZOLAM HCL 2 MG/2ML IJ SOLN
INTRAMUSCULAR | Status: AC
  Filled 2023-03-10: qty 2

## 2023-03-10 MED ORDER — ASPIRIN 81 MG PO CHEW
81.0000 mg | CHEWABLE_TABLET | Freq: Once | ORAL | Status: AC
  Administered 2023-03-10: 81 mg via ORAL

## 2023-03-10 MED ORDER — ASPIRIN 81 MG PO CHEW
CHEWABLE_TABLET | ORAL | Status: AC
  Filled 2023-03-10: qty 1

## 2023-03-10 MED ORDER — SODIUM CHLORIDE 0.9% FLUSH
3.0000 mL | Freq: Two times a day (BID) | INTRAVENOUS | Status: DC
  Administered 2023-03-10: 3 mL via INTRAVENOUS

## 2023-03-10 MED ORDER — HEPARIN SODIUM (PORCINE) 1000 UNIT/ML IJ SOLN
INTRAMUSCULAR | Status: DC | PRN
  Administered 2023-03-10: 5000 [IU] via INTRAVENOUS
  Administered 2023-03-10: 2000 [IU] via INTRAVENOUS

## 2023-03-10 MED ORDER — FENTANYL CITRATE (PF) 100 MCG/2ML IJ SOLN
INTRAMUSCULAR | Status: DC | PRN
  Administered 2023-03-10: 25 ug via INTRAVENOUS

## 2023-03-10 MED ORDER — ASPIRIN 81 MG PO TBEC: 81.0000 mg | DELAYED_RELEASE_TABLET | Freq: Every day | ORAL | Status: DC

## 2023-03-10 MED ORDER — ASPIRIN 300 MG RE SUPP
300.0000 mg | RECTAL | Status: DC
  Filled 2023-03-10: qty 1

## 2023-03-10 MED ORDER — FENTANYL CITRATE (PF) 100 MCG/2ML IJ SOLN
INTRAMUSCULAR | Status: AC
  Filled 2023-03-10: qty 2

## 2023-03-10 MED ORDER — LABETALOL HCL 5 MG/ML IV SOLN: 10.0000 mg | INTRAVENOUS | Status: AC | PRN

## 2023-03-10 MED ORDER — ONDANSETRON HCL 4 MG/2ML IJ SOLN: 4.0000 mg | Freq: Four times a day (QID) | INTRAMUSCULAR | Status: DC | PRN

## 2023-03-10 MED ORDER — LIDOCAINE HCL (PF) 1 % IJ SOLN
INTRAMUSCULAR | Status: AC
  Filled 2023-03-10: qty 30

## 2023-03-10 MED ORDER — MIDAZOLAM HCL 2 MG/2ML IJ SOLN
INTRAMUSCULAR | Status: DC | PRN
  Administered 2023-03-10: 2 mg via INTRAVENOUS

## 2023-03-10 MED ORDER — ACETAMINOPHEN 325 MG PO TABS: 650.0000 mg | ORAL_TABLET | ORAL | Status: DC | PRN

## 2023-03-10 MED ORDER — HYDRALAZINE HCL 20 MG/ML IJ SOLN: 10.0000 mg | INTRAMUSCULAR | Status: AC | PRN

## 2023-03-10 MED ORDER — SODIUM CHLORIDE 0.9% FLUSH: 3.0000 mL | INTRAVENOUS | Status: DC | PRN

## 2023-03-10 MED ORDER — IRBESARTAN 150 MG PO TABS
300.0000 mg | ORAL_TABLET | Freq: Every day | ORAL | Status: DC
  Administered 2023-03-11: 300 mg via ORAL
  Filled 2023-03-10: qty 2

## 2023-03-10 MED ORDER — ASPIRIN 81 MG PO TBEC
81.0000 mg | DELAYED_RELEASE_TABLET | Freq: Every day | ORAL | Status: DC
Start: 2023-03-11 — End: 2023-03-10

## 2023-03-10 MED ORDER — HEPARIN (PORCINE) IN NACL 1000-0.9 UT/500ML-% IV SOLN
INTRAVENOUS | Status: DC | PRN
  Administered 2023-03-10 (×2): 500 mL

## 2023-03-10 MED ORDER — ASPIRIN 81 MG PO CHEW: 324.0000 mg | CHEWABLE_TABLET | ORAL | Status: DC

## 2023-03-10 MED ORDER — SODIUM CHLORIDE 0.9 % IV SOLN: 250.0000 mL | INTRAVENOUS | Status: DC | PRN

## 2023-03-10 MED ORDER — CLOPIDOGREL BISULFATE 75 MG PO TABS
75.0000 mg | ORAL_TABLET | Freq: Every day | ORAL | Status: DC
  Administered 2023-03-11: 75 mg via ORAL
  Filled 2023-03-10: qty 1

## 2023-03-10 MED ORDER — CARVEDILOL 6.25 MG PO TABS
6.2500 mg | ORAL_TABLET | Freq: Two times a day (BID) | ORAL | Status: DC
  Administered 2023-03-10 – 2023-03-11 (×2): 6.25 mg via ORAL
  Filled 2023-03-10 (×2): qty 1

## 2023-03-10 SURGICAL SUPPLY — 17 items
CATH INFINITI 5FR MULTPACK ANG (CATHETERS) IMPLANT
CATH LAUNCHER 5F IMA (CATHETERS) IMPLANT
CATH LAUNCHER 5F JR4 (CATHETERS) IMPLANT
CATH LAUNCHER 5F NOTO (CATHETERS) IMPLANT
CATHETER LAUNCHER 5F IMA (CATHETERS) ×1
CATHETER LAUNCHER 5F NOTO (CATHETERS) ×1
CLOSURE PERCLOSE PROSTYLE (VASCULAR PRODUCTS) IMPLANT
ELECT DEFIB PAD ADLT CADENCE (PAD) IMPLANT
GLIDESHEATH SLEND SS 6F .021 (SHEATH) IMPLANT
GUIDEWIRE INQWIRE 1.5J.035X260 (WIRE) IMPLANT
GUIDEWIRE PRESSURE X 175 (WIRE) IMPLANT
INQWIRE 1.5J .035X260CM (WIRE) ×1
PACK CARDIAC CATHETERIZATION (CUSTOM PROCEDURE TRAY) ×1 IMPLANT
SET ATX-X65L (MISCELLANEOUS) IMPLANT
SHEATH PINNACLE 5F 10CM (SHEATH) IMPLANT
SHEATH PROBE COVER 6X72 (BAG) IMPLANT
WIRE MICRO SET SILHO 5FR 7 (SHEATH) IMPLANT

## 2023-03-10 NOTE — H&P (View-Only) (Signed)
 Cardiology Office Note:    Date:  03/10/2023  ID:  Laney, Louderback Aug 06, 1940, MRN [id number] PCP: Joshua Domino, DO  [facility] Providers Cardiologist:  [name], MD Cardiology APP:  Lelon Glendia DASEN, PA-C       Patient Profile:      Coronary artery disease Inferoposterior STEMI 10/19: 4 x 15 mm DES to the LCx 11/20/17; staged 2.75 x 18 mm DES to the LAD 11/23/17 LHC 11/20/17: pLAD 80, mLAD 80, oLCx 100, pRCA 60, EF 45 Complicated by atrial fibrillation Ischemic cardiomyopathy w return of normal LVF EF 35-40 in 11/2017 TTE 04/01/18: EF 60-65, mild LVH, grade 1 diastolic dysfunction, mild aortic valve sclerosis, trivial AI  Hypertension Hyperlipidemia Peripheral Arterial Disease           History of Present Illness:   Melissa Gibbs is a 83 y.o. female who returns for evaluation of chest pain. She was last seen in 09/2022. She called in this morning with chest pain earlier today. She was added on to my schedule. She is here alone. She had an episode of chest pain 2 weeks ago in the morning after awakening. It was very brief. She had no more symptoms again until this morning. The chest pain awoke her at 3:30 am. It is substernal and L sided. It feels like a pressure. She has had some radiation to her neck. Her pain was 7/10 for about an hour. She still has some discomfort now described as soreness. She has not had associated shortness of breath. She did feel diaphoretic. She did not have nausea. She has not taken NTG.   Review of Systems  Constitutional: Negative for fever.  Cardiovascular:  Negative for dyspnea on exertion, orthopnea, paroxysmal nocturnal dyspnea and syncope.  Respiratory:  Positive for cough. Negative for sputum production.   Gastrointestinal:  Negative for hematochezia and melena.  Genitourinary:  Negative for hematuria.  -See HPI    Studies Reviewed:   EKG Interpretation Date/Time:  Tuesday March 10 2023 10:46:07 EST Ventricular Rate:   65 PR Interval:  172 QRS Duration:  78 QT Interval:  386 QTC Calculation: 401 R Axis:   6  Text Interpretation: Normal sinus rhythm Low voltage QRS Cannot rule out Anterior infarct No significant change since last tracing dated 09/15/22 Confirmed by Lelon Glendia [phone]) on 03/10/2023 10:49:15 AM     Risk Assessment/Calculations:     HYPERTENSION CONTROL Vitals:   03/10/23 1034 03/10/23 1116  BP: (!) 159/82 (!) 170/100    The patient's blood pressure is elevated above target today.  In order to address the patient's elevated BP: Blood pressure will be monitored at home to determine if medication changes need to be made.          Physical Exam:   VS:  BP (!) 170/100   Pulse 65   Ht 5' 2 (1.575 m)   Wt 158 lb 12.8 oz (72 kg)   SpO2 96%   BMI 29.04 kg/m    Wt Readings from Last 3 Encounters:  03/10/23 158 lb 12.8 oz (72 kg)  09/15/22 154 lb 12.8 oz (70.2 kg)  04/29/22 158 lb 12.8 oz (72 kg)    Constitutional:      Appearance: Healthy appearance. Not in distress.  Neck:     Vascular: JVD normal.  Pulmonary:     Breath sounds: Normal breath sounds. No wheezing. No rales.  Cardiovascular:     Normal rate. Regular rhythm.  Murmurs: There is no murmur.  Edema:    Peripheral edema absent.  Abdominal:     Palpations: Abdomen is soft.        Assessment and Plan:   Assessment & Plan Coronary artery disease involving native coronary artery of native heart with unstable angina pectoris (HCC) History of infero-posterior STEMI in 2019 tx with DES to LCX and staged DES to LAD.  She presents today with chest discomfort that started at 3:30 this morning.  Significant symptoms lasted for about an hour.  She did not take nitroglycerin .  Her blood pressure is markedly elevated.  Her EKG does not demonstrate any acute changes.  She still has some soreness in her chest as well as some burning.  Her presentation is concerning for unstable angina.  I have recommended admission to the  hospital with plans to undergo cardiac catheterization.  I reviewed this with Dr. Waddell (attending MD) who agreed.  The patient did notice more burning in her chest prior to leaving the office.  I recommended calling 911 to have an ambulance to take her across the street.  However, she declined this and insisted that her daughter take her. -Admit to [facility] today -Plan cardiac catheterization later today -Cycle cardiac enzymes Essential hypertension Blood pressure markedly elevated.  Continue carvedilol  6.25 mg twice daily, irbesartan  3 mg daily.  If blood pressure remains uncontrolled after cardiac cath, consider amlodipine . Hyperlipidemia LDL goal <70 Continue Lipitor  80 mg daily.  Recent LDL optimal.   Informed Consent   Shared Decision Making/Informed Consent The risks [stroke (1 in 1000), death (1 in 1000), kidney failure [usually temporary] (1 in 500), bleeding (1 in 200), allergic reaction [possibly serious] (1 in 200)], benefits (diagnostic support and management of coronary artery disease) and alternatives of a cardiac catheterization were discussed in detail with Ms. Gallant and she is willing to proceed.     Dispo:  Return for [facility] Follow Up.  Signed, Glendia Ferrier, PA-C

## 2023-03-10 NOTE — Assessment & Plan Note (Signed)
Blood pressure markedly elevated.  Continue carvedilol 6.25 mg twice daily, irbesartan 3 mg daily.  If blood pressure remains uncontrolled after cardiac cath, consider amlodipine.

## 2023-03-10 NOTE — H&P (Addendum)
 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 female who returns for evaluation of chest pain. She was last seen in 09/2022. She called in this morning with chest pain earlier today. She was added on to my schedule. She is here alone. She had an episode of chest pain 2 weeks ago in the morning after awakening. It was very brief. She had no more symptoms again until this morning. The chest pain awoke her at 3:30 am. It is substernal and L sided. It feels like a pressure. She has had some radiation to her neck. Her pain was 7/10 for about an hour. She still has some discomfort now described as soreness. She has not had associated shortness of breath. She did feel diaphoretic. She did not have nausea. She has not taken NTG.   Review of Systems  Constitutional: Negative for fever.  Cardiovascular:  Negative for dyspnea on exertion, orthopnea, paroxysmal nocturnal dyspnea and syncope.  Respiratory:  Positive for cough. Negative for sputum production.   Gastrointestinal:  Negative for hematochezia and melena.  Genitourinary:  Negative for hematuria.  -See HPI    Studies Reviewed:   EKG Interpretation Date/Time: Tuesday March 10 2023 10:46:07 EST  Text  Interpretation: Normal sinus rhythm Low voltage QRS Cannot rule out Anterior infarct No significant change since last tracing dated 09/15/22 Confirmed by Lelon Glendia [phone]) on 03/10/2023 10:49:15 AM     Risk Assessment/Calculations:     HYPERTENSION CONTROL Vitals:   03/10/23 1034 03/10/23 1116  BP: (!) 159/82 (!) 170/100    The patient's blood pressure is elevated above target today.  In order to address the patient's elevated BP: Blood pressure will be monitored at home to determine if medication changes need to be made.          Physical Exam:   VS:  BP (!) 170/100   Pulse 65   Ht 5' 2 (1.575 m)   Wt 158 lb 12.8 oz (72 kg)   SpO2 96%   BMI 29.04 kg/m    Wt Readings from Last 3 Encounters:  03/10/23 158 lb 12.8 oz (72 kg)  09/15/22 154 lb 12.8 oz (70.2 kg)  04/29/22 158 lb 12.8 oz (72 kg)    Constitutional:      Appearance: Healthy appearance. Not in distress.  Neck:     Vascular: JVD normal.  Pulmonary:     Breath sounds: Normal breath sounds. No wheezing. No rales.  Cardiovascular:     Normal rate. Regular rhythm.     Murmurs: There is no murmur.  Edema:    Peripheral edema absent.  Abdominal:     Palpations: Abdomen is soft.  Assessment and Plan:   Assessment & Plan Coronary artery disease involving native coronary artery of native heart with unstable angina pectoris (HCC) History of infero-posterior STEMI in 2019 tx with DES to LCX and staged DES to LAD.  She presents today with chest discomfort that started at 3:30 this morning.  Significant symptoms lasted for about an hour.  She did not take nitroglycerin .  Her blood pressure is markedly elevated.  Her EKG does not demonstrate any acute changes.  She still has some soreness in her chest as well as some burning.  Her presentation is concerning for unstable angina.  I have recommended admission to the hospital with plans to undergo cardiac catheterization.  I reviewed this with Dr. Waddell (attending MD)  who agreed.  The patient did notice more burning in her chest prior to leaving the office.  I recommended calling 911 to have an ambulance to take her across the street.  However, she declined this and insisted that her daughter take her. -Admit to [facility] today -Plan cardiac catheterization later today -Cycle cardiac enzymes  Essential hypertension Blood pressure markedly elevated.  Continue carvedilol  6.25 mg twice daily, irbesartan  3 mg daily.  If blood pressure remains uncontrolled after cardiac cath, consider amlodipine .  Hyperlipidemia LDL goal <70 Continue Lipitor  80 mg daily.  Recent LDL optimal.    Informed Consent   Shared Decision Making/Informed Consent The risks [stroke (1 in 1000), death (1 in 1000), kidney failure [usually temporary] (1 in 500), bleeding (1 in 200), allergic reaction [possibly serious] (1 in 200)], benefits (diagnostic support and management of coronary artery disease) and alternatives of a cardiac catheterization were discussed in detail with Ms. Holzworth and she is willing to proceed.     Risk Assessment/Risk Scores:    TIMI Risk Score for Unstable Angina or Non-ST Elevation MI:   The patient's TIMI risk score is  , which indicates a  % risk of all cause mortality, new or recurrent myocardial infarction or need for urgent revascularization in the next 14 days.  Code Status: Full Code  Severity of Illness: The appropriate patient status for this patient is OBSERVATION. Observation status is judged to be reasonable and necessary in order to provide the required intensity of service to ensure the patient's safety. The patient's presenting symptoms, physical exam findings, and initial radiographic and laboratory data in the context of their medical condition is felt to place them at decreased risk for further clinical deterioration. Furthermore, it is anticipated that the patient will be medically stable for discharge from the hospital within 2  midnights of admission.    For questions or updates, please contact [facility] Please consult [email or web address] for contact info under     Signed, Glendia Ferrier, PA-C  03/10/2023 11:33 AM   Patient seen, examined. Available data reviewed. Agree with findings, assessment, and plan as outlined by Glendia Ferrier, PA-C.  The patient is independently interviewed and evaluated.  She is currently chest pain-free but her symptoms are concerning for unstable angina.  She presents to the short stay for cardiac catheterization and possible PCI as outlined above.  I have reviewed her cardiac catheterization studies from 2019 when she underwent primary PCI of the left circumflex and then later underwent staged PCI of severe stenosis in the proximal LAD.  Will plan on femoral access since she had difficult right radial access in the past.  Further plans/disposition pending her cardiac catheterization results.  All of her questions were answered.  Jeannett Dekoning  Wonda, M.D. 03/10/2023 1:11 PM

## 2023-03-10 NOTE — Progress Notes (Signed)
 Cardiology Office Note:    Date:  03/10/2023  ID:  Laney, Louderback Aug 06, 1940, MRN [id number] PCP: Joshua Domino, DO  [facility] Providers Cardiologist:  [name], MD Cardiology APP:  Lelon Glendia DASEN, PA-C       Patient Profile:      Coronary artery disease Inferoposterior STEMI 10/19: 4 x 15 mm DES to the LCx 11/20/17; staged 2.75 x 18 mm DES to the LAD 11/23/17 LHC 11/20/17: pLAD 80, mLAD 80, oLCx 100, pRCA 60, EF 45 Complicated by atrial fibrillation Ischemic cardiomyopathy w return of normal LVF EF 35-40 in 11/2017 TTE 04/01/18: EF 60-65, mild LVH, grade 1 diastolic dysfunction, mild aortic valve sclerosis, trivial AI  Hypertension Hyperlipidemia Peripheral Arterial Disease           History of Present Illness:   Melissa Gibbs is a 83 y.o. female who returns for evaluation of chest pain. She was last seen in 09/2022. She called in this morning with chest pain earlier today. She was added on to my schedule. She is here alone. She had an episode of chest pain 2 weeks ago in the morning after awakening. It was very brief. She had no more symptoms again until this morning. The chest pain awoke her at 3:30 am. It is substernal and L sided. It feels like a pressure. She has had some radiation to her neck. Her pain was 7/10 for about an hour. She still has some discomfort now described as soreness. She has not had associated shortness of breath. She did feel diaphoretic. She did not have nausea. She has not taken NTG.   Review of Systems  Constitutional: Negative for fever.  Cardiovascular:  Negative for dyspnea on exertion, orthopnea, paroxysmal nocturnal dyspnea and syncope.  Respiratory:  Positive for cough. Negative for sputum production.   Gastrointestinal:  Negative for hematochezia and melena.  Genitourinary:  Negative for hematuria.  -See HPI    Studies Reviewed:   EKG Interpretation Date/Time:  Tuesday March 10 2023 10:46:07 EST Ventricular Rate:   65 PR Interval:  172 QRS Duration:  78 QT Interval:  386 QTC Calculation: 401 R Axis:   6  Text Interpretation: Normal sinus rhythm Low voltage QRS Cannot rule out Anterior infarct No significant change since last tracing dated 09/15/22 Confirmed by Lelon Glendia [phone]) on 03/10/2023 10:49:15 AM     Risk Assessment/Calculations:     HYPERTENSION CONTROL Vitals:   03/10/23 1034 03/10/23 1116  BP: (!) 159/82 (!) 170/100    The patient's blood pressure is elevated above target today.  In order to address the patient's elevated BP: Blood pressure will be monitored at home to determine if medication changes need to be made.          Physical Exam:   VS:  BP (!) 170/100   Pulse 65   Ht 5' 2 (1.575 m)   Wt 158 lb 12.8 oz (72 kg)   SpO2 96%   BMI 29.04 kg/m    Wt Readings from Last 3 Encounters:  03/10/23 158 lb 12.8 oz (72 kg)  09/15/22 154 lb 12.8 oz (70.2 kg)  04/29/22 158 lb 12.8 oz (72 kg)    Constitutional:      Appearance: Healthy appearance. Not in distress.  Neck:     Vascular: JVD normal.  Pulmonary:     Breath sounds: Normal breath sounds. No wheezing. No rales.  Cardiovascular:     Normal rate. Regular rhythm.  Murmurs: There is no murmur.  Edema:    Peripheral edema absent.  Abdominal:     Palpations: Abdomen is soft.        Assessment and Plan:   Assessment & Plan Coronary artery disease involving native coronary artery of native heart with unstable angina pectoris (HCC) History of infero-posterior STEMI in 2019 tx with DES to LCX and staged DES to LAD.  She presents today with chest discomfort that started at 3:30 this morning.  Significant symptoms lasted for about an hour.  She did not take nitroglycerin .  Her blood pressure is markedly elevated.  Her EKG does not demonstrate any acute changes.  She still has some soreness in her chest as well as some burning.  Her presentation is concerning for unstable angina.  I have recommended admission to the  hospital with plans to undergo cardiac catheterization.  I reviewed this with Dr. Waddell (attending MD) who agreed.  The patient did notice more burning in her chest prior to leaving the office.  I recommended calling 911 to have an ambulance to take her across the street.  However, she declined this and insisted that her daughter take her. -Admit to [facility] today -Plan cardiac catheterization later today -Cycle cardiac enzymes Essential hypertension Blood pressure markedly elevated.  Continue carvedilol  6.25 mg twice daily, irbesartan  3 mg daily.  If blood pressure remains uncontrolled after cardiac cath, consider amlodipine . Hyperlipidemia LDL goal <70 Continue Lipitor  80 mg daily.  Recent LDL optimal.   Informed Consent   Shared Decision Making/Informed Consent The risks [stroke (1 in 1000), death (1 in 1000), kidney failure [usually temporary] (1 in 500), bleeding (1 in 200), allergic reaction [possibly serious] (1 in 200)], benefits (diagnostic support and management of coronary artery disease) and alternatives of a cardiac catheterization were discussed in detail with Ms. Gallant and she is willing to proceed.     Dispo:  Return for [facility] Follow Up.  Signed, Glendia Ferrier, PA-C

## 2023-03-10 NOTE — Assessment & Plan Note (Signed)
Continue Lipitor 80 mg daily.  Recent LDL optimal.

## 2023-03-10 NOTE — Interval H&P Note (Signed)
 History and Physical Interval Note:  03/10/2023 1:09 PM  Melissa Gibbs  has presented today for surgery, with the diagnosis of nstemi.  The various methods of treatment have been discussed with the patient and family. After consideration of risks, benefits and other options for treatment, the patient has consented to  Procedure(s): LEFT HEART CATH AND CORONARY ANGIOGRAPHY (N/A) as a surgical intervention.  The patient's history has been reviewed, patient examined, no change in status, stable for surgery.  I have reviewed the patient's chart and labs.  Questions were answered to the patient's satisfaction.     [name]

## 2023-03-10 NOTE — Telephone Encounter (Signed)
   Pt c/o of Chest Pain: STAT if active CP, including tightness, pressure, jaw pain, radiating pain to shoulder/upper arm/back, CP unrelieved by Nitro. Symptoms reported of SOB, nausea, vomiting, sweating.  1. Are you having CP right now? No , started around 3:34 am and started subsiding around 3:50    2. Are you experiencing any other symptoms (ex. SOB, nausea, vomiting, sweating)? Warm and clamy during episode this morning but back to normal now no symptoms   3. Is your CP continuous or coming and going? Continuous during episode   4. Have you taken Nitroglycerin ? Yes but did not take, chewed on some antacids    5. How long have you been experiencing CP? History of about a month ago but this episode was slightly worse    6. If NO CP at time of call then end call with telling Pt to call back or call 911 if Chest pain returns prior to return call from triage team.

## 2023-03-10 NOTE — Assessment & Plan Note (Signed)
 History of infero-posterior STEMI in 2019 tx with DES to LCX and staged DES to LAD.  She presents today with chest discomfort that started at 3:30 this morning.  Significant symptoms lasted for about an hour.  She did not take nitroglycerin .  Her blood pressure is markedly elevated.  Her EKG does not demonstrate any acute changes.  She still has some soreness in her chest as well as some burning.  Her presentation is concerning for unstable angina.  I have recommended admission to the hospital with plans to undergo cardiac catheterization.  I reviewed this with Dr. Waddell (attending MD) who agreed.  The patient did notice more burning in her chest prior to leaving the office.  I recommended calling 911 to have an ambulance to take her across the street.  However, she declined this and insisted that her daughter take her. -Admit to [facility] today -Plan cardiac catheterization later today -Cycle cardiac enzymes

## 2023-03-10 NOTE — Patient Instructions (Signed)
Medication Instructions:  Your physician recommends that you continue on your current medications as directed. Please refer to the Current Medication list given to you today.  *If you need a refill on your cardiac medications before your next appointment, please call your pharmacy*   Lab Work: None ordered  If you have labs (blood work) drawn today and your tests are completely normal, you will receive your results only by: MyChart Message (if you have MyChart) OR A paper copy in the mail If you have any lab test that is abnormal or we need to change your treatment, we will call you to review the results.   Testing/Procedures: Your physician has requested that you have a cardiac catheterization TODAY:  GO TO [facility], ENTRANCE A, CHECK IN AT ADMISSIONS. Cardiac catheterization is used to diagnose and/or treat various heart conditions. Doctors may recommend this procedure for a number of different reasons. The most common reason is to evaluate chest pain. Chest pain can be a symptom of coronary artery disease (CAD), and cardiac catheterization can show whether plaque is narrowing or blocking your heart's arteries. This procedure is also used to evaluate the valves, as well as measure the blood flow and oxygen levels in different parts of your heart. For further information please visit [email or web address] Please follow instruction sheet, as given.    Follow-Up: At [facility], you and your health needs are our priority.  As part of our continuing mission to provide you with exceptional heart care, we have created designated Provider Care Teams.  These Care Teams include your primary Cardiologist (physician) and Advanced Practice Providers (APPs -  Physician Assistants and Nurse Practitioners) who all work together to provide you with the care you need, when you need it.  We recommend signing up for the patient portal called "MyChart".  Sign up information is provided on this After Visit  Summary.  MyChart is used to connect with patients for Virtual Visits (Telemedicine).  Patients are able to view lab/test results, encounter notes, upcoming appointments, etc.  Non-urgent messages can be sent to your provider as well.   To learn more about what you can do with MyChart, go to [email or web address].    Your next appointment:   2 week(s)  Provider:   Tereso Newcomer, PA-C         Other Instructions   1st Floor: - Lobby - Registration  - Pharmacy  - Lab - Cafe  2nd Floor: - PV Lab - Diagnostic Testing (echo, CT, nuclear med)  3rd Floor: - Vacant  4th Floor: - TCTS (cardiothoracic surgery) - AFib Clinic - Structural Heart Clinic - Vascular Surgery  - Vascular Ultrasound  5th Floor: - HeartCare Cardiology (general and EP) - Clinical Pharmacy for coumadin, hypertension, lipid, weight-loss medications, and med management appointments    Valet parking services will be available as well.

## 2023-03-10 NOTE — Telephone Encounter (Signed)
Spoke with pt and she stated she does have a ride and will be here for the 10:30 AM appt with Tereso Newcomer, PA-C.

## 2023-03-10 NOTE — Telephone Encounter (Signed)
 Spoke with pt over the phone and she stated she was having CP this AM but subsided around 3:45 AM. She said because she isn't due to be seen again until around August, she wasn't sure if she should come in sooner to be evaluated. She stated she normally sees [facility], [address] [place] had an opening at 10:30 AM today and pt is calling her ride to see if she can make the appt slot.

## 2023-03-11 ENCOUNTER — Encounter (HOSPITAL_COMMUNITY): Payer: Self-pay | Admitting: Cardiovascular Disease

## 2023-03-11 DIAGNOSIS — E785 Hyperlipidemia, unspecified: Secondary | ICD-10-CM | POA: Diagnosis not present

## 2023-03-11 DIAGNOSIS — I739 Peripheral vascular disease, unspecified: Secondary | ICD-10-CM | POA: Diagnosis not present

## 2023-03-11 DIAGNOSIS — I255 Ischemic cardiomyopathy: Secondary | ICD-10-CM | POA: Diagnosis not present

## 2023-03-11 DIAGNOSIS — Z23 Encounter for immunization: Secondary | ICD-10-CM | POA: Diagnosis not present

## 2023-03-11 DIAGNOSIS — I25110 Atherosclerotic heart disease of native coronary artery with unstable angina pectoris: Secondary | ICD-10-CM | POA: Diagnosis not present

## 2023-03-11 DIAGNOSIS — I10 Essential (primary) hypertension: Secondary | ICD-10-CM | POA: Diagnosis not present

## 2023-03-11 LAB — POCT ACTIVATED CLOTTING TIME: Activated Clotting Time: 187 s

## 2023-03-11 NOTE — Care Management Obs Status (Signed)
 MEDICARE OBSERVATION STATUS NOTIFICATION   Patient Details  Name: DENEAN PAVON MRN: [id number] Date of Birth: [birth year]   Medicare Observation Status Notification Given:  Yes    Omie Bickers, RN 03/11/2023, 10:43 AM

## 2023-03-11 NOTE — Progress Notes (Signed)
 Pt Q2 dropping to 86 while sleeping. Placed nasal canula @ 2L    Update 0230- pt refused to continue to wear Melissa Gibbs- educated her on the importance of it.

## 2023-03-11 NOTE — Discharge Summary (Signed)
 Discharge Summary    Patient ID: [name] MRN: [id number]; DOB: [birth year]  Admit date: 03/10/2023 Discharge date: 03/11/2023  PCP:  Joshua Domino, DO   [facility] Providers Cardiologist:  [name], MD  Cardiology APP:  Lelon Glendia DASEN, PA-C    Discharge Diagnoses    Principal Problem:   Coronary artery disease involving native coronary artery of native heart with unstable angina pectoris [facility]) Active Problems:   Hyperlipidemia LDL goal <70   Essential hypertension  Diagnostic Studies/Procedures    Cath: 03/10/2023    Prox RCA lesion is 70% stenosed.   Mid RCA lesion is 40% stenosed.   Mid LAD lesion is 80% stenosed.   Previously placed Prox LAD stent of unknown type is  widely patent.   Non-stenotic Ost Cx to Prox Cx lesion was previously treated.   1.  Patent left main with no stenosis 2.  Patent proximal LAD stent with no restenosis, severe mid LAD stenosis unchanged from the previous study suitable for medical therapy with a small caliber vessel 3.  Widely patent circumflex stent with no stenosis and a large OM branch without significant stenosis 4.  Patent RCA with moderate proximal and mid vessel stenoses, with negative RFR of 0.92 5.  Normal LVEDP   Recommendations: Suspect noncardiac chest pain, continue medical management.  Stable coronary anatomy noted.  Overnight observation as long as no complications arise and patient can be discharged home tomorrow.  Diagnostic Dominance: Right  _____________   History of Present Illness     Melissa Gibbs is a 83 y.o. female with CAD s/p inferior STEMI DES to Lcx, staged DES to LAD '19, ischemic cardiomyopathy, hypertension, hyperlipidemia, peripheral artery disease who was seen in the office on 2/4 for evaluation of chest pain.  She called into the office earlier that day with complaints.  Reported having pain that woke her from sleep around 3:30 AM.  Had some radiation down into her neck.  Given her  symptoms being concerning for unstable angina she was referred to the hospital for cardiac catheterization via Glendia Lelon, and Dr. Waddell.  Hospital Course     CAD (history of STEMI with PCI to left circumflex and LAD '19) -- Underwent cardiac catheterization noted above on 2/4 with proximal LAD stent with no restenosis, severe mid LAD stenosis unchanged from previous study suitable for medical therapy given small caliber vessel, widely patent left circumflex, patent RCA with moderate proximal/mid vessel stenosis with negative RFR of 0.92.,  Normal LVEDP. -- Felt symptoms were noncardiac in nature with recommendation to continue medical therapy.  Hypertension -- Continue carvedilol  6.25 mg twice daily, irbesartan  300 mg daily  Hyperlipidemia -- continue Atorvastatin  80 mg daily  Patient was seen by Dr. Elmira and deemed stable for discharge home.  Follow-up arranged in the office.  Did the patient have an acute coronary syndrome (MI, NSTEMI, STEMI, etc) this admission?:  No                               Did the patient have a percutaneous coronary intervention (stent / angioplasty)?:  No.          _____________  Discharge Vitals Blood pressure 129/66, pulse 60, temperature 98.2 F (36.8 C), temperature source Oral, resp. rate 18, height 5' 2 (1.575 m), weight 72.1 kg, SpO2 94%.  Filed Weights   03/10/23 1204  Weight: 72.1 kg    Labs &  Radiologic Studies    CBC Recent Labs    03/10/23 1147 03/10/23 1208  WBC 8.0  --   NEUTROABS 5.4  --   HGB 11.4* 12.9  HCT 35.0* 38.0  MCV 91.4  --   PLT 263  --    Basic Metabolic Panel Recent Labs    97/95/74 1147 03/10/23 1208  NA 139 142  K 3.8 3.9  CL 106 105  CO2 24  --   GLUCOSE 116* 113*  BUN 13 15  CREATININE 0.84 0.90  CALCIUM  9.2  --    Liver Function Tests No results for input(s): AST, ALT, ALKPHOS, BILITOT, PROT, ALBUMIN in the last 72 hours. No results for input(s): LIPASE, AMYLASE in  the last 72 hours. High Sensitivity Troponin:   Recent Labs  Lab 03/10/23 1147  TROPONINIHS 7    BNP Invalid input(s): POCBNP D-Dimer No results for input(s): DDIMER in the last 72 hours. Hemoglobin A1C No results for input(s): HGBA1C in the last 72 hours. Fasting Lipid Panel No results for input(s): CHOL, HDL, LDLCALC, TRIG, CHOLHDL, LDLDIRECT in the last 72 hours. Thyroid  Function Tests No results for input(s): TSH, T4TOTAL, T3FREE, THYROIDAB in the last 72 hours.  Invalid input(s): FREET3 _____________  CARDIAC CATHETERIZATION Result Date: 03/10/2023   Prox RCA lesion is 70% stenosed.   Mid RCA lesion is 40% stenosed.   Mid LAD lesion is 80% stenosed.   Previously placed Prox LAD stent of unknown type is  widely patent.   Non-stenotic Ost Cx to Prox Cx lesion was previously treated. 1.  Patent left main with no stenosis 2.  Patent proximal LAD stent with no restenosis, severe mid LAD stenosis unchanged from the previous study suitable for medical therapy with a small caliber vessel 3.  Widely patent circumflex stent with no stenosis and a large OM branch without significant stenosis 4.  Patent RCA with moderate proximal and mid vessel stenoses, with negative RFR of 0.92 5.  Normal LVEDP Recommendations: Suspect noncardiac chest pain, continue medical management.  Stable coronary anatomy noted.  Overnight observation as long as no complications arise and patient can be discharged home tomorrow.   Disposition   Pt is being discharged home today in good condition.  Follow-up Plans & Appointments    Discharge Instructions     Diet - low sodium heart healthy   Complete by: As directed    Diet - low sodium heart healthy   Complete by: As directed    Discharge instructions   Complete by: As directed    Groin Site Care Refer to this sheet in the next few weeks. These instructions provide you with information on caring for yourself after your procedure.  Your caregiver may also give you more specific instructions. Your treatment has been planned according to current medical practices, but problems sometimes occur. Call your caregiver if you have any problems or questions after your procedure. HOME CARE INSTRUCTIONS You may shower 24 hours after the procedure. Remove the bandage (dressing) and gently wash the site with plain soap and water. Gently pat the site dry.  Do not apply powder or lotion to the site.  Do not sit in a bathtub, swimming pool, or whirlpool for 5 to 7 days.  No bending, squatting, or lifting anything over 10 pounds (4.5 kg) as directed by your caregiver.  Inspect the site at least twice daily.  Do not drive home if you are discharged the same day of the procedure. Have someone else drive  you.  You may drive 24 hours after the procedure unless otherwise instructed by your caregiver.  What to expect: Any bruising will usually fade within 1 to 2 weeks.  Blood that collects in the tissue (hematoma) may be painful to the touch. It should usually decrease in size and tenderness within 1 to 2 weeks.  SEEK IMMEDIATE MEDICAL CARE IF: You have unusual pain at the groin site or down the affected leg.  You have redness, warmth, swelling, or pain at the groin site.  You have drainage (other than a small amount of blood on the dressing).  You have chills.  You have a fever or persistent symptoms for more than 72 hours.  You have a fever and your symptoms suddenly get worse.  Your leg becomes pale, cool, tingly, or numb.  You have heavy bleeding from the site. Hold pressure on the site. .   Increase activity slowly   Complete by: As directed    Increase activity slowly   Complete by: As directed       Discharge Medications   Allergies as of 03/11/2023       Reactions   Amoxicillin Diarrhea   Has tolerated Ceftriaxone  - Has patient had a PCN reaction causing immediate rash, facial/tongue/throat swelling, SOB or lightheadedness  with hypotension: NO Has patient had a PCN reaction causing severe rash involving mucus membranes or skin necrosis: NO Has patient had a PCN reaction that required hospitalization: NO Has patient had a PCN reaction occurring within the last 10 years: NO  If all of the above answers are NO, then may proceed with Cephalosporin use.        Medication List     TAKE these medications    alendronate  10 MG tablet Commonly known as: FOSAMAX  TAKE 1 TABLET BY MOUTH DAILY  BEFORE BREAKFAST WITH A FULL  GLASS OF WATER ON AN EMPTY  STOMACH   atorvastatin  80 MG tablet Commonly known as: LIPITOR  TAKE 1 TABLET BY MOUTH DAILY AT  6 PM   carvedilol  6.25 MG tablet Commonly known as: COREG  TAKE 1 TABLET BY MOUTH TWICE  DAILY WITH MEALS   Cholecalciferol  10 MCG (400 UNIT) Caps Take 2 capsules (800 Units total) by mouth daily.   clopidogrel  75 MG tablet Commonly known as: PLAVIX  TAKE 1 TABLET BY MOUTH DAILY   estradiol  0.1 MG/GM vaginal cream Commonly known as: ESTRACE  Apply 1/2 inch nightly for 2 weeks followed by twice a week thereafter   irbesartan  300 MG tablet Commonly known as: AVAPRO  TAKE 1 TABLET BY MOUTH DAILY   nitroGLYCERIN  0.4 MG SL tablet Commonly known as: NITROSTAT  Place 1 tablet (0.4 mg total) under the tongue every 5 (five) minutes for 3 doses as needed for chest pain.        Outstanding Labs/Studies   N/a   Duration of Discharge Encounter: APP Time: 15 minutes   Signed, Manuelita Rummer, NP 03/11/2023, 1:48 PM

## 2023-03-25 ENCOUNTER — Ambulatory Visit: Payer: Medicare Other | Admitting: Physician Assistant

## 2023-03-26 ENCOUNTER — Ambulatory Visit: Payer: Medicare Other | Admitting: General Practice

## 2023-03-29 NOTE — Progress Notes (Unsigned)
 Cardiology Office Note    Date:  03/30/2023  ID:  Anaisha, Mago [birth year], MRN [id number] PCP:  Erick Alley, DO  Cardiologist:  Tonny Bollman, MD  Electrophysiologist:  None   Chief Complaint: Follow up for chest pain   History of Present Illness: [name]    Melissa Gibbs is a 83 y.o. female with visit-pertinent history of CAD s/p inferior STEMI DES to LCx, staged DES to LAD in 2019, ischemic cardiomyopathy, hypertension, hyperlipidemia, peripheral artery disease.  Patient with history of inferior posterior STEMI in 11/2017 with 4 x 15 mm DES to the LCx on 11/20/2017, staged 2.75 x 18 mm DES to the LAD on 11/23/2017.  LHC on 11/20/2017 showed pLAD 80%, mid LAD 80%, ostial left circumflex 100%, P RCA 60%, EF 45%.  Case was complicated by atrial fibrillation.  Echo in 11/2017 indicated EF 35 to 40%, TTE on 04/01/2018 indicated EF 60 to 65%, mild LVH, grade 1 diastolic dysfunction, mild aortic valve sclerosis, trivial AI.  On 03/10/2023 patient notified the office that she was having pain that woke her from sleep at 3:30 AM, with radiation into her neck.  She was seen in clinic and given symptoms being concerning for unstable angina she was referred to the hospital for cardiac catheterization.  Cardiac catheterization on 2/4 showed patent left main with no stenosis, patent proximal LAD stent with no restenosis, severe mid LAD stenosis unchanged from previous study stable for medical therapy as is a small caliber vessel, widely patent circumflex stent with no stenosis and a large OM branch without significant stenosis.  Patent RCA with moderate proximal and mid vessel stenosis with negative RFR of 0.92, patient normal LV EDP.  Today she presents for follow-up.  She reports that she is doing well, she questions if her symptoms were related to acid reflux. She notes she has been having more indigestion during the day than she normally does, relieved with tums.  She notes that she is not on any  prevention medication for GERD.  Notes that she has been having a sour taste and burning in the back of her throat.  She denies any further chest pain, shortness of breath, lower extremity edema, orthopnea or pnd.  She notes that she is active around her house, plans to start gardening again.  Labwork independently reviewed: 03/10/2023: Sodium 142, potassium 3.9, creatinine 0.90, hemoglobin 12.9, hematocrit 38 ROS: .   Today she denies chest pain, shortness of breath, lower extremity edema, fatigue, palpitations, melena, hematuria, hemoptysis, diaphoresis, weakness, presyncope, syncope, orthopnea, and PND.  All other systems are reviewed and otherwise negative. Studies Reviewed: [name]    EKG:  EKG is not ordered today.  CV Studies:  Cardiac Studies & Procedures   ______________________________________________________________________________________________ CARDIAC CATHETERIZATION  CARDIAC CATHETERIZATION 03/10/2023  Narrative   Prox RCA lesion is 70% stenosed.   Mid RCA lesion is 40% stenosed.   Mid LAD lesion is 80% stenosed.   Previously placed Prox LAD stent of unknown type is  widely patent.   Non-stenotic Ost Cx to Prox Cx lesion was previously treated.  1.  Patent left main with no stenosis 2.  Patent proximal LAD stent with no restenosis, severe mid LAD stenosis unchanged from the previous study suitable for medical therapy with a small caliber vessel 3.  Widely patent circumflex stent with no stenosis and a large OM branch without significant stenosis 4.  Patent RCA with moderate proximal and mid vessel stenoses, with negative RFR of 0.92 5.  Normal LVEDP  Recommendations: Suspect noncardiac chest pain, continue medical management.  Stable coronary anatomy noted.  Overnight observation as long as no complications arise and patient can be discharged home tomorrow.  Findings Coronary Findings Diagnostic  Dominance: Right  Left Anterior Descending Previously placed Prox LAD stent of  unknown type is  widely patent. The lesion is eccentric. The previous stent in the LAD is widely patent with no stenosis Mid LAD lesion is 80% stenosed. Unchanged from the previous study.  Not suitable for PCI due to small vessel caliber and the diagonal branch is a larger vessel that supplies more myocardium.  Left Circumflex Non-stenotic Ost Cx to Prox Cx lesion was previously treated. The lesion is thrombotic. Stent remains widely patent  Right Coronary Artery Prox RCA lesion is 70% stenosed. Mid RCA lesion is 40% stenosed.  Intervention  No interventions have been documented.   CARDIAC CATHETERIZATION  CARDIAC CATHETERIZATION 11/23/2017  Narrative  Prox LAD lesion is 80% stenosed.  Mid LAD lesion is 80% stenosed.  A drug-eluting stent was successfully placed using a STENT SIERRA 2.75 X 18 MM.  Post intervention, there is a 0% residual stenosis.  1. Severe stenosis mid LAD (staged PCI planned for today). 2. Successful PTCA/DES x 1 mid LAD  Recommendations: Likely discharge home tomorrow. Continue beta blocker and high intensity statin. Recommend uninterrupted dual antiplatelet therapy with Aspirin 81mg  daily and Ticagrelor 90mg  twice daily for a minimum of 12 months (ACS - Class I recommendation).  Findings Coronary Findings Diagnostic  Dominance: Right  Left Anterior Descending Prox LAD lesion is 80% stenosed. The lesion is eccentric. Mid LAD lesion is 80% stenosed.  Intervention  Prox LAD lesion Stent CATH VISTA GUIDE 6FR XBLAD3.5 guide catheter was inserted. Lesion crossed with guidewire using a WIRE COUGAR XT STRL 190CM. Pre-stent angioplasty was performed using a BALLOON SAPPHIRE 2.0X15. A drug-eluting stent was successfully placed using a STENT SIERRA 2.75 X 18 MM. Stent strut is well apposed. Post-stent angioplasty was performed using a BALLOON SAPPHIRE [place] 3.0X10. Post-Intervention Lesion Assessment The intervention was successful. Pre-interventional TIMI  flow is 3. Post-intervention TIMI flow is 3. No complications occurred at this lesion. There is a 0% residual stenosis post intervention.     ECHOCARDIOGRAM  ECHOCARDIOGRAM COMPLETE 04/01/2018  Narrative ECHOCARDIOGRAM REPORT    Patient Name:   VONNE MCDANEL Date of Exam: 04/01/2018 Medical Rec #:  [id number]     Height:       61.0 in Accession #:    [id number]    Weight:       162.4 lb Date of Birth:  [birth year]    BSA:          1.73 m Patient Age:    77 years      BP:           126/80 mmHg Patient Gender: F             HR:           62 bpm. Exam Location:  [address]   Procedure: 2D Echo, 3D Echo, Color Doppler and Cardiac Doppler  Indications:    I50.9 CHF  History:        Patient has prior history of Echocardiogram examinations, most recent 11/22/2017. CAD and Previous Myocardial Infarction; Risk Factors: Family History of Coronary Artery Disease, Hypertension, Dyslipidemia and Former Smoker. Atrial Fibrillation Status Post Myocardial Infarction (STEMI), Ischemic Cardiomyopathy.  Sonographer:    Farrel Conners RDCS Referring Phys: [phone]  IMPRESSIONS   1. The left ventricle has normal systolic function with an ejection fraction of 60-65%. The cavity size was normal. There is mildly increased left ventricular wall thickness. Left ventricular diastolic Doppler parameters are consistent with impaired relaxation Indeterminent filling pressures The E/e' is 8-15. No evidence of left ventricular regional wall motion abnormalities. 2. The right ventricle has normal systolic function. The cavity was normal. There is no increase in right ventricular wall thickness. 3. The mitral valve is normal in structure. 4. The tricuspid valve is normal in structure. 5. The aortic valve is tricuspid Mild sclerosis of the aortic valve. Aortic valve regurgitation is trivial by color flow Doppler. 6. No evidence of left ventricular regional wall motion abnormalities. 7. When  compared to the prior study: Compared to a prior study in 11/2017, the LVEF has improved from 35-40% up to 60-65%.  SUMMARY  LVEF 60-65%, mild LVH, normal wall motion, grade 1 DD, indeterminate LV filling pressure, tricuspid aortic valve with trivial AI, trivial MR, trivial TR, normal RVSP, normal IVC FINDINGS Left Ventricle: The left ventricle has normal systolic function, with an ejection fraction of 60-65%. The cavity size was normal. There is mildly increased left ventricular wall thickness. Left ventricular diastolic Doppler parameters are consistent with impaired relaxation Indeterminent filling pressures The E/e' is 8-15. No evidence of left ventricular regional wall motion abnormalities.. Right Ventricle: The right ventricle has normal systolic function. The cavity was normal. There is no increase in right ventricular wall thickness. Left Atrium: left atrial size was normal in size Right Atrium: right atrial size was normal in size. Right atrial pressure is estimated at 3 mmHg. Interatrial Septum: No atrial level shunt detected by color flow Doppler. Pericardium: There is no evidence of pericardial effusion. Mitral Valve: The mitral valve is normal in structure. Mitral valve regurgitation is trivial by color flow Doppler. Tricuspid Valve: The tricuspid valve is normal in structure. Tricuspid valve regurgitation is trivial by color flow Doppler. Aortic Valve: The aortic valve is tricuspid Mild sclerosis of the aortic valve. Aortic valve regurgitation is trivial by color flow Doppler. There is no evidence of aortic valve stenosis. Pulmonic Valve: The pulmonic valve was grossly normal. Pulmonic valve regurgitation is trivial by color flow Doppler. Venous: The inferior vena cava measures 1.10 cm, is normal in size with greater than 50% respiratory variability. Compared to previous exam: Compared to a prior study in 11/2017, the LVEF has improved from 35-40% up to 60-65%.  LEFT  VENTRICLE PLAX 2D (Teich) LV EF:          65.2 %   Diastology LVIDd:          3.47 cm  LV e' lateral:   3.85 cm/s LVIDs:          2.26 cm  LV E/e' lateral: 11.8 LV PW:          0.88 cm  LV e' medial:    3.26 cm/s LV IVS:         0.76 cm  LV E/e' medial:  14.0 LVOT diam:      2.20 cm LV SV:          32 ml LVOT Area:      3.80 cm  RIGHT VENTRICLE RV S prime:     17.20 cm/s TAPSE (M-mode): 2.3 cm RVSP:           24.4 mmHg  LEFT ATRIUM             Index  RIGHT ATRIUM           Index LA diam:        4.20 cm 2.43 cm/m  RA Pressure: 3 mmHg LA Vol (A2C):   32.2 ml 18.63 ml/m RA Area:     15.80 cm LA Vol (A4C):   41.1 ml 23.77 ml/m RA Volume:   37.50 ml  21.69 ml/m LA Biplane Vol: 36.4 ml 21.06 ml/m AORTIC VALVE LVOT Vmax:   109.00 cm/s LVOT Vmean:  66.100 cm/s LVOT VTI:    0.288 m AR PHT:      644 msec  AORTA Ao Root diam: 3.20 cm Ao Asc diam:  3.60 cm  MITRAL VALVE              TRICUSPID VALVE MV Area (PHT): cm        TR Peak grad:   21.4 mmHg MV PHT:        msec       TR Vmax:        269.00 cm/s MV Decel Time: 423 msec   RVSP:           24.4 mmHg MV E velocity: 45.60 cm/s MV A velocity: 92.40 cm/s MV E/A ratio:  0.49  IVC IVC diam: 1.10 cm   Zoila Shutter MD Electronically signed by Zoila Shutter MD Signature Date/Time: 04/01/2018/1:46:12 PM    Final          ______________________________________________________________________________________________      Current Reported Medications:.    Current Meds  Medication Sig   alendronate (FOSAMAX) 10 MG tablet TAKE 1 TABLET BY MOUTH DAILY  BEFORE BREAKFAST WITH A FULL  GLASS OF WATER ON AN EMPTY  STOMACH   atorvastatin (LIPITOR) 80 MG tablet TAKE 1 TABLET BY MOUTH DAILY AT  6 PM   carvedilol (COREG) 6.25 MG tablet TAKE 1 TABLET BY MOUTH TWICE  DAILY WITH MEALS   Cholecalciferol 10 MCG (400 UNIT) CAPS Take 2 capsules (800 Units total) by mouth daily.   clopidogrel (PLAVIX) 75 MG tablet TAKE 1 TABLET  BY MOUTH DAILY   estradiol (ESTRACE) 0.1 MG/GM vaginal cream Apply 1/2 inch nightly for 2 weeks followed by twice a week thereafter   irbesartan (AVAPRO) 300 MG tablet TAKE 1 TABLET BY MOUTH DAILY   nitroGLYCERIN (NITROSTAT) 0.4 MG SL tablet Place 1 tablet (0.4 mg total) under the tongue every 5 (five) minutes for 3 doses as needed for chest pain.   pantoprazole (PROTONIX) 40 MG tablet Take 1 tablet (40 mg total) by mouth daily.   Physical Exam:    VS:  BP 134/70 (BP Location: Left Arm, Patient Position: Sitting, Cuff Size: Normal)   Pulse 75   Ht 5\' 2"  (1.575 m)   Wt 159 lb (72.1 kg)   SpO2 97%   BMI 29.08 kg/m    Wt Readings from Last 3 Encounters:  03/30/23 159 lb (72.1 kg)  03/10/23 159 lb (72.1 kg)  03/10/23 158 lb 12.8 oz (72 kg)    GEN: Well nourished, well developed in no acute distress NECK: No JVD; No carotid bruits CARDIAC: RRR, no murmurs, rubs, gallops, right femoral cath site clean and intact without evidence of hematoma  RESPIRATORY:  Clear to auscultation without rales, wheezing or rhonchi  ABDOMEN: Soft, non-tender, non-distended EXTREMITIES:  No edema; No acute deformity  Asessement and Plan:.    CAD: History of inferior STEMI with DES to LCx, staged DES to LAD in 2019.  On 2//25 she underwent cardiac catheterization  in setting of chest pain with proximal LAD stent with no restenosis, severe mid LAD stenosis unchanged from previous study with recommendation for medical therapy given small caliber vessel, widely patent left circumflex, patent RCA with moderate proximal/mid vessel stenosis with a negative RFR of 0.92, normal LVEDP.  It was felt that her symptoms were noncardiac in nature with recommendation to continue medical therapy.  Today she reports that she is doing well, she denies any further episodes of chest pain.  She notes that she has been having increased indigestion in recent weeks and wonders if this could have been the cause of her discomfort.  Continue  Lipitor 80 mg daily, carvedilol 6.25 mg twice daily, Plavix 75 mg daily and irbesartan 300 mg daily.  GERD: Patient questions if her recent episode of chest pain was related to acid reflux.  She notes that she has been having more indigestion during the day than she previously did.  Episodes have been relieved by Tums.  Patient agreeable to starting on Protonix 40 mg daily, she is agreeing to trialing for 1 month.  She notes that she would prefer not to take medication.  Patient plans to also follow-up with her PCP regarding ongoing management.  Start Protonix 40 mg daily.  Ischemic cardiomyopathy: EF improved to 60 to 65% by echocardiogram in February 2020. Today she appears euvolemic well compensated on exam.  She denies any shortness of breath, lower extremity edema, orthopnea or PND.  Patient will continue to monitor for symptoms. Continue irbesartan 300 mg daily and carvedilol 6.25 mg twice daily.  Hypertension: Blood pressure today 134/70. Reports consistently around or less than 130/80. Continue carvedilol 6.25 mg twice daily, irbesartan 300 mg daily.   Hyperlipidemia: Direct LDL 60 on 09/15/2022.  Continue Lipitor 80 mg daily.    Disposition: F/u with Tereso Newcomer, PA or Dr. Excell Seltzer in 3 months or sooner if needed, patient request.   Signed, Rip Harbour, NP

## 2023-03-30 ENCOUNTER — Encounter: Payer: Self-pay | Admitting: Cardiology

## 2023-03-30 ENCOUNTER — Ambulatory Visit: Payer: Medicare Other | Attending: Cardiology | Admitting: Cardiology

## 2023-03-30 ENCOUNTER — Other Ambulatory Visit (HOSPITAL_COMMUNITY): Payer: Self-pay

## 2023-03-30 VITALS — BP 134/70 | HR 75 | Ht 62.0 in | Wt 159.0 lb

## 2023-03-30 DIAGNOSIS — E785 Hyperlipidemia, unspecified: Secondary | ICD-10-CM

## 2023-03-30 DIAGNOSIS — I255 Ischemic cardiomyopathy: Secondary | ICD-10-CM | POA: Diagnosis not present

## 2023-03-30 DIAGNOSIS — I10 Essential (primary) hypertension: Secondary | ICD-10-CM | POA: Diagnosis not present

## 2023-03-30 DIAGNOSIS — I2510 Atherosclerotic heart disease of native coronary artery without angina pectoris: Secondary | ICD-10-CM

## 2023-03-30 DIAGNOSIS — K219 Gastro-esophageal reflux disease without esophagitis: Secondary | ICD-10-CM

## 2023-03-30 MED ORDER — PANTOPRAZOLE SODIUM 40 MG PO TBEC
40.0000 mg | DELAYED_RELEASE_TABLET | Freq: Every day | ORAL | 2 refills | Status: DC
  Filled 2023-03-30: qty 30, 30d supply, fill #0

## 2023-03-30 NOTE — Patient Instructions (Signed)
 Medication Instructions:  Take Protonix 40 for the next 30 days *If you need a refill on your cardiac medications before your next appointment, please call your pharmacy*  Lab Work: No labs  Testing/Procedures: No testing  Follow-Up: At [facility], you and your health needs are our priority.  As part of our continuing mission to provide you with exceptional heart care, we have created designated Provider Care Teams.  These Care Teams include your primary Cardiologist (physician) and Advanced Practice Providers (APPs -  Physician Assistants and Nurse Practitioners) who all work together to provide you with the care you need, when you need it.  We recommend signing up for the patient portal called "MyChart".  Sign up information is provided on this After Visit Summary.  MyChart is used to connect with patients for Virtual Visits (Telemedicine).  Patients are able to view lab/test results, encounter notes, upcoming appointments, etc.  Non-urgent messages can be sent to your provider as well.   To learn more about what you can do with MyChart, go to [email or web address].    Your next appointment:   3 month(s)  Provider:   Tonny Bollman, MD  or Tereso Newcomer, PA-C

## 2023-03-31 ENCOUNTER — Encounter: Payer: Self-pay | Admitting: Obstetrics and Gynecology

## 2023-03-31 ENCOUNTER — Ambulatory Visit: Payer: Medicare Other | Admitting: Obstetrics and Gynecology

## 2023-03-31 VITALS — BP 134/80

## 2023-03-31 DIAGNOSIS — N8110 Cystocele, unspecified: Secondary | ICD-10-CM

## 2023-03-31 DIAGNOSIS — N812 Incomplete uterovaginal prolapse: Secondary | ICD-10-CM

## 2023-03-31 NOTE — Progress Notes (Signed)
 [facility]   Subjective:     Chief Complaint:  Chief Complaint  Patient presents with   Pessary Check    Says guess its doing ok - feels like it wants to come out and has to push back some - can get uncomfortable when falls    History of Present Illness: Melissa Gibbs is a 83 y.o. female with stage II pelvic organ prolapse who presents for a pessary check. She is using a size 2 1/4in long stem gellhorn pessary. The pessary has been working well and she reports she does sometimes have to push the pessary back up when it falls down some. She is using vaginal estrogen, but is not as consistently as she reports she should. She denies vaginal bleeding.  Past Medical History: Patient  has a past medical history of ACS (acute coronary syndrome) (HCC) (11/20/2017), CAD (coronary artery disease) (11/20/2017), Cataract, Hyperlipidemia, Hypertension, Ischemic cardiomyopathy, Myocardial infarction [facility]), PAD (peripheral artery disease) (HCC), Paroxysmal atrial fibrillation in setting of acute MI, Shingles (2000), and Skin cancer.   Past Surgical History: She  has a past surgical history that includes Vaginal hysterectomy; Skin cancer excision (2014); Coronary/Graft Acute MI Revascularization (N/A, 11/20/2017); LEFT HEART CATH AND CORONARY ANGIOGRAPHY (N/A, 11/20/2017); CORONARY STENT INTERVENTION (N/A, 11/20/2017); CORONARY STENT INTERVENTION (N/A, 11/23/2017); Cataract extraction, bilateral; LEFT HEART CATH AND CORONARY ANGIOGRAPHY (N/A, 03/10/2023); and CORONARY PRESSURE/FFR STUDY (N/A, 03/10/2023).   Medications: She has a current medication list which includes the following prescription(s): alendronate, atorvastatin, carvedilol, cholecalciferol, clopidogrel, estradiol, irbesartan, nitroglycerin, and pantoprazole.   Allergies: Patient is allergic to amoxicillin.   Social History: Patient  reports that she has quit smoking. Her smoking use included cigarettes. She has never used  smokeless tobacco. She reports that she does not currently use alcohol. She reports that she does not use drugs.      Objective:    Physical Exam: BP 134/80 (BP Location: Left Leg, Patient Position: Sitting, Cuff Size: Normal)  Gen: No apparent distress, A&O x 3. Detailed Urogynecologic Evaluation:  Pelvic Exam: Normal external female genitalia; Bartholin's and Skene's glands normal in appearance; urethral meatus normal in appearance, no urethral masses or discharge. The pessary was noted to be in place. It was removed and cleaned. Speculum exam revealed no lesions in the vagina. The pessary was replaced. It was comfortable to the patient and fit well.    Assessment/Plan:    Assessment: Melissa Gibbs is a 83 y.o. with stage II pelvic organ prolapse here for a pessary check. She is doing well.  Plan: She will keep the pessary in place until next visit. She will continue to use estrogen. She will follow-up in 3 months for a pessary check or sooner as needed.   Patient reports she is doing okay with the pessary for now and will let us know if she wanted to be re-fit or wants to discuss surgery.   All questions were answered.

## 2023-04-04 ENCOUNTER — Other Ambulatory Visit: Payer: Self-pay | Admitting: Cardiovascular Disease

## 2023-04-10 ENCOUNTER — Ambulatory Visit (INDEPENDENT_AMBULATORY_CARE_PROVIDER_SITE_OTHER): Payer: Medicare Other | Admitting: Student

## 2023-04-10 ENCOUNTER — Encounter: Payer: Self-pay | Admitting: Student

## 2023-04-10 VITALS — BP 161/83 | HR 68 | Ht 62.0 in | Wt 159.2 lb

## 2023-04-10 DIAGNOSIS — R079 Chest pain, unspecified: Secondary | ICD-10-CM | POA: Diagnosis not present

## 2023-04-10 DIAGNOSIS — I10 Essential (primary) hypertension: Secondary | ICD-10-CM | POA: Diagnosis not present

## 2023-04-10 DIAGNOSIS — Z23 Encounter for immunization: Secondary | ICD-10-CM

## 2023-04-10 NOTE — Progress Notes (Signed)
    SUBJECTIVE:   CHIEF COMPLAINT / HPI:   HTN Currently taking coreg 6.25 mg BID and Irbesartan 300 mg daily.  States blood pressures have been well-controlled at home, typically 1 teens-120s/70s.  Does feel anxious being at the doctor today but is overall feeling well.  Chest pain About 2 weeks ago, woke up ~ 3 am with dull sternal chest pain which went up into throat and burned. was worried she was having a heart attack. Seen by cardiology at [facility], cardiac cath showing nonobstructive CAD, did not think chest pain was cardiac in nature and discharged her on a PPI. She is wondering if the pain was related to acid reflux - did have a cup of coffee and maybe a cookie late in the night before bed.  Denies cyst frequent symptoms of acid reflux including belching, regurgitation, burning in her throat. The chest pain 2 weeks ago lasted <1 hr and has not returned.   PERTINENT  PMH / PSH: Hx MI s/p stents, HTN  OBJECTIVE:   BP (!) 161/83   Pulse 68   Ht 5\' 2"  (1.575 m)   Wt 159 lb 3.2 oz (72.2 kg)   SpO2 100%   BMI 29.12 kg/m    General: NAD, pleasant, able to participate in exam Cardiac: RRR, no murmurs. Respiratory: CTAB, normal effort, No wheezes, rales or rhonchi Extremities: no edema BLEs Skin: warm and dry Neuro: alert, no obvious focal deficits Psych: Normal affect and mood  ASSESSMENT/PLAN:   Essential hypertension Uncontrolled on repeat and asymptomatic.  Per patient, has been well-controlled at home. Discussed the option of ambulatory blood pressure monitoring which patient declined at this time.  Prefers to check blood pressure at home and return in 2 weeks for RN BP check. -Continue current medications as in HPI  Chest pain Brief episode of chest pain 2 weeks ago.  Thoroughly worked up by cardiology including cardiac cath which showed nonobstructive CAD, did not think chest pain was cardiac in nature and was discharged on Protonix. Discussed with patient it is  possible that chest pain was related to acid reflux given she had drank coffee and eaten later in the night.  She denies frequent acid reflux symptoms today but on chart review, she admitted having a sour taste in her mouth and burning in her throat frequently to cardiology. -CTM, return if chest pain reoccurs -Can continue Protonix 40 mg daily for 8 weeks and then trial switching to Pepcid     Dr. Erick Alley, DO [facility]

## 2023-04-10 NOTE — Patient Instructions (Signed)
 It was great to see you! Thank you for allowing me to participate in your care!  Our plans for today:  -Continue to check blood pressure at home and let me know the readings -Return in 2 weeks for blood pressure check with the nurse -Continue current medications   Take care and seek immediate care sooner if you develop any concerns.   Dr. Erick Alley, DO [facility]

## 2023-04-11 DIAGNOSIS — R079 Chest pain, unspecified: Secondary | ICD-10-CM | POA: Insufficient documentation

## 2023-04-11 NOTE — Assessment & Plan Note (Addendum)
 Brief episode of chest pain 2 weeks ago.  Thoroughly worked up by cardiology including cardiac cath which showed nonobstructive CAD, did not think chest pain was cardiac in nature and was discharged on Protonix. Discussed with patient it is possible that chest pain was related to acid reflux given she had drank coffee and eaten later in the night.  She denies frequent acid reflux symptoms today but on chart review, she admitted having a sour taste in her mouth and burning in her throat frequently to cardiology. -CTM, return if chest pain reoccurs -Can continue Protonix 40 mg daily for 8 weeks and then trial switching to Pepcid

## 2023-04-11 NOTE — Assessment & Plan Note (Signed)
 Uncontrolled on repeat and asymptomatic.  Per patient, has been well-controlled at home. Discussed the option of ambulatory blood pressure monitoring which patient declined at this time.  Prefers to check blood pressure at home and return in 2 weeks for RN BP check. -Continue current medications as in HPI

## 2023-04-13 ENCOUNTER — Telehealth: Payer: Self-pay | Admitting: Student

## 2023-04-13 NOTE — Telephone Encounter (Signed)
 Spoke with pt on the phone She has been having symptoms of dry mouth since taking the protonix. She prefers to stop the protonix and take tums for acid reflux sypmtoms which she says works for her.  Advised she let me know if acid reflux symptoms return and we can trial pepcid.

## 2023-04-23 ENCOUNTER — Encounter: Payer: Self-pay | Admitting: Student

## 2023-05-15 ENCOUNTER — Ambulatory Visit: Admitting: Student

## 2023-06-11 ENCOUNTER — Ambulatory Visit: Payer: Medicare Other

## 2023-06-11 VITALS — Ht 62.0 in | Wt 155.0 lb

## 2023-06-11 DIAGNOSIS — Z0000 Encounter for general adult medical examination without abnormal findings: Secondary | ICD-10-CM | POA: Diagnosis not present

## 2023-06-11 NOTE — Patient Instructions (Addendum)
 Melissa Gibbs , Thank you for taking time out of your busy schedule to complete your Annual Wellness Visit with me. I enjoyed our conversation and look forward to speaking with you again next year. I, as well as your care team,  appreciate your ongoing commitment to your health goals. Please review the following plan we discussed and let me know if I can assist you in the future. Your Game plan/ To Do List    Referrals: If you haven't heard from the office you've been referred to, please reach out to them at the phone provided.   Follow up Visits: Next Medicare AWV with our clinical staff: 06/16/2024 at 3:30 pm PHONE VISIT   Have you seen your provider in the last 6 months (3 months if uncontrolled diabetes)? Yes   Clinician Recommendations:  Aim for 30 minutes of exercise or brisk walking, 6-8 glasses of water, and 5 servings of fruits and vegetables each day.       This is a list of the screening recommended for you and due dates:  Health Maintenance  Topic Date Due   Colon Cancer Screening  08/09/2012   DTaP/Tdap/Td vaccine (2 - Tdap) 11/07/2013   Zoster (Shingles) Vaccine (1 of 2) 07/11/2023*   Flu Shot  09/04/2023   COVID-19 Vaccine (5 - Pfizer risk 2024-25 season) 10/11/2023   Medicare Annual Wellness Visit  06/10/2024   Pneumonia Vaccine  Completed   DEXA scan (bone density measurement)  Completed   HPV Vaccine  Aged Out   Meningitis B Vaccine  Aged Out   Hepatitis C Screening  Discontinued  *Topic was postponed. The date shown is not the original due date.    Advanced directives: (In Chart) A copy of your advanced directives are scanned into your chart should your provider ever need it. Advance Care Planning is important because it:  [x]  Makes sure you receive the medical care that is consistent with your values, goals, and preferences  [x]  It provides guidance to your family and loved ones and reduces their decisional burden about whether or not they are making the right  decisions based on your wishes.  Follow the link provided in your after visit summary or read over the paperwork we have mailed to you to help you started getting your Advance Directives in place. If you need assistance in completing these, please reach out to us  so that we can help you!  See attachments for Preventive Care and Fall Prevention Tips.

## 2023-06-11 NOTE — Progress Notes (Signed)
 Because this visit was a virtual/telehealth visit,  certain criteria was not obtained, such a blood pressure, CBG if applicable, and timed get up and go. Any medications not marked as "taking" were not mentioned during the medication reconciliation part of the visit. Any vitals not documented were not able to be obtained due to this being a telehealth visit or patient was unable to self-report a recent blood pressure reading due to a lack of equipment at home via telehealth. Vitals that have been documented are verbally provided by the patient.   Subjective:   Melissa Gibbs is a 83 y.o. who presents for a Medicare Wellness preventive visit.  Visit Complete: Virtual I connected with  Melissa Gibbs on 06/11/23 by a audio enabled telemedicine application and verified that I am speaking with the correct person using two identifiers.  Patient Location: Home  Provider Location: Office/Clinic  I discussed the limitations of evaluation and management by telemedicine. The patient expressed understanding and agreed to proceed.  Vital Signs: Because this visit was a virtual/telehealth visit, some criteria may be missing or patient reported. Any vitals not documented were not able to be obtained and vitals that have been documented are patient reported.  VideoDeclined- This patient declined Librarian, academic. Therefore the visit was completed with audio only.  Persons Participating in Visit: Patient.  AWV Questionnaire: No: Patient Medicare AWV questionnaire was not completed prior to this visit.  Cardiac Risk Factors include: advanced age (>30men, >40 women);dyslipidemia;family history of premature cardiovascular disease;hypertension;sedentary lifestyle     Objective:     Today's Vitals   06/11/23 1454  Weight: 155 lb (70.3 kg)  Height: 5\' 2"  (1.575 m)  PainSc: 3   PainLoc: Knee   Body mass index is 28.35 kg/m.     06/11/2023    3:06 PM 04/10/2023    2:53 PM  04/10/2023    2:22 PM 03/11/2023    1:31 PM 03/10/2023   12:08 PM 06/05/2022    2:31 PM 04/29/2022    8:38 AM  Advanced Directives  Does Patient Have a Medical Advance Directive? Yes No No No No No No  Type of Estate agent of [place];Living will        Does patient want to make changes to medical advance directive? No - Patient declined        Copy of Healthcare Power of Attorney in Chart? Yes - validated most recent copy scanned in chart (See row information)        Would patient like information on creating a medical advance directive?  No - Patient declined No - Patient declined No - Patient declined No - Patient declined No - Patient declined     Current Medications (verified) Outpatient Encounter Medications as of 06/11/2023  Medication Sig   alendronate  (FOSAMAX ) 10 MG tablet TAKE 1 TABLET BY MOUTH DAILY  BEFORE BREAKFAST WITH A FULL  GLASS OF WATER ON AN EMPTY  STOMACH   atorvastatin  (LIPITOR ) 80 MG tablet TAKE 1 TABLET BY MOUTH DAILY AT  6 PM   carvedilol  (COREG ) 6.25 MG tablet TAKE 1 TABLET BY MOUTH TWICE  DAILY WITH MEALS   Cholecalciferol  10 MCG (400 UNIT) CAPS Take 2 capsules (800 Units total) by mouth daily.   clopidogrel  (PLAVIX ) 75 MG tablet TAKE 1 TABLET BY MOUTH DAILY   estradiol  (ESTRACE ) 0.1 MG/GM vaginal cream Apply 1/2 inch nightly for 2 weeks followed by twice a week thereafter   irbesartan  (AVAPRO ) 300  MG tablet TAKE 1 TABLET BY MOUTH DAILY   nitroGLYCERIN  (NITROSTAT ) 0.4 MG SL tablet Place 1 tablet (0.4 mg total) under the tongue every 5 (five) minutes for 3 doses as needed for chest pain.   No facility-administered encounter medications on file as of 06/11/2023.    Allergies (verified) Amoxicillin   History: Past Medical History:  Diagnosis Date   ACS (acute coronary syndrome) (HCC) 11/20/2017   CAD (coronary artery disease) 11/20/2017   a. Dx STEMI on 11/20/2017 s/p staged PCI with DES to pLCx and DES to mid LAD   Cataract    Hyperlipidemia     Hypertension    Ischemic cardiomyopathy    a. Echo 11/22/2017 w/ LVEF of 35-40%, hypokinesis of basal and mid inferolateral, inferior, and apical inferior and lateral walls, and G1DD   Myocardial infarction [facility])    PAD (peripheral artery disease) (HCC)    a. Right radial artery noted to be occluded during heart catheterization in 11/2017.   Paroxysmal atrial fibrillation in setting of acute MI    a. Brief episode of atrial fibrillation in setting of acute STEMI - no chronic anticoagulation needed unless recurrent episodes   Shingles 2000   R flank, mild, self reported, did not present to care    Skin cancer    Past Surgical History:  Procedure Laterality Date   CATARACT EXTRACTION, BILATERAL     CORONARY PRESSURE/FFR STUDY N/A 03/10/2023   Procedure: CORONARY PRESSURE/FFR STUDY;  Surgeon: Arnoldo Lapping, MD;  Location: [facility] INVASIVE CV LAB;  Service: Cardiovascular;  Laterality: N/A;   CORONARY STENT INTERVENTION N/A 11/20/2017   Procedure: CORONARY STENT INTERVENTION;  Surgeon: Arnoldo Lapping, MD;  Location: [facility] INVASIVE CV LAB;  Service: Cardiovascular;  Laterality: N/A;   CORONARY STENT INTERVENTION N/A 11/23/2017   Procedure: CORONARY STENT INTERVENTION;  Surgeon: Odie Benne, MD;  Location: [facility] INVASIVE CV LAB;  Service: Cardiovascular;  Laterality: N/A;   CORONARY/GRAFT ACUTE MI REVASCULARIZATION N/A 11/20/2017   Procedure: Coronary/Graft Acute MI Revascularization;  Surgeon: Arnoldo Lapping, MD;  Location: [facility] INVASIVE CV LAB;  Service: Cardiovascular;  Laterality: N/A;   LEFT HEART CATH AND CORONARY ANGIOGRAPHY N/A 11/20/2017   Procedure: LEFT HEART CATH AND CORONARY ANGIOGRAPHY;  Surgeon: Arnoldo Lapping, MD;  Location: [facility] INVASIVE CV LAB;  Service: Cardiovascular;  Laterality: N/A;   LEFT HEART CATH AND CORONARY ANGIOGRAPHY N/A 03/10/2023   Procedure: LEFT HEART CATH AND CORONARY ANGIOGRAPHY;  Surgeon: Arnoldo Lapping, MD;  Location: [facility] INVASIVE CV LAB;  Service:  Cardiovascular;  Laterality: N/A;   SKIN CANCER EXCISION  2014   Sees [facility] regularly   VAGINAL HYSTERECTOMY     fibroids    Family History  Problem Relation Age of Onset   Heart disease Mother 62       MI , rheumatic fever as a child, and heart murmur   Cancer Father        knee cancer   Colon cancer Father    Hypertension Father    Cancer Sister        melanoma - died at 4   Melanoma Sister    Heart disease Brother    Melanoma Other        died at age 40   Cancer Sister    Lung cancer Sister    Stroke Neg Hx    Social History   Socioeconomic History   Marital status: Divorced    Spouse name: Not on file   Number of children: 1  Years of education: 33   Highest education level: Associate degree: academic program  Occupational History   Occupation: retired    Associate Professor: [employer]    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 Patients daughter helps her around her home and comes over often, Joyceann No.    Patient enjoys hard work, playing with her animals, crafts, and spending time with her daughter.    Social Drivers of Corporate investment banker Strain: Low Risk  (06/11/2023)   Overall Financial Resource Strain (CARDIA)    Difficulty of Paying Living Expenses: Not hard at all  Food Insecurity: No Food Insecurity (06/11/2023)   Hunger Vital Sign    Worried About Running Out of Food in the Last Year: Never true    Ran Out of Food in the Last Year: Never true  Transportation Needs: No  Transportation Needs (06/11/2023)   PRAPARE - Administrator, Civil Service (Medical): No    Lack of Transportation (Non-Medical): No  Physical Activity: Inactive (06/11/2023)   Exercise Vital Sign    Days of Exercise per Week: 0 days    Minutes of Exercise per Session: 0 min  Stress: No Stress Concern Present (06/11/2023)   [employer] of Occupational Health - Occupational Stress Questionnaire    Feeling of Stress : Not at all  Social Connections: Socially Isolated (06/11/2023)   Social Connection and Isolation Panel [NHANES]    Frequency of Communication with Friends and Family: More than three times a week    Frequency of Social Gatherings with Friends and Family: Three times a week    Attends Religious Services: Never    Active Member of Clubs or Organizations: No    Attends Banker Meetings: Never    Marital Status: Divorced    Tobacco Counseling Counseling given: Not Answered Tobacco comments: quit smoking at age 26    Clinical Intake:  Pre-visit preparation completed: Yes  Pain : 0-10 Pain Score: 3  (3-4) Pain Type: Acute pain Pain Location: Knee Pain Orientation: Right (back of knee) Pain Descriptors / Indicators: Shooting, Sore, Discomfort Pain Onset: In the past 7 days Pain Frequency: Intermittent     BMI - recorded: 28.35 Nutritional Status: BMI 25 -29 Overweight Nutritional Risks: None Diabetes: No  Lab Results  Component Value Date   HGBA1C 5.7 (A) 08/26/2019     How often do you need to have someone help you when you read instructions, pamphlets, or other written materials from your doctor or pharmacy?: 1 - Never  Interpreter Needed?: No  Information entered by :: Druscilla Gerhard, LPN.   Activities of Daily Living     06/11/2023    3:06 PM 03/11/2023    1:30 PM  In your present state of health, do you have any difficulty performing the following activities:  Hearing? 0   Vision? 0   Difficulty concentrating or making  decisions? 0  Walking or climbing stairs? 0   Dressing or bathing? 0   Doing errands, shopping? 0 0  Preparing Food and eating ? N   Using the Toilet? N   In the past six months, have you accidently leaked urine? Y   Do you have problems with loss of bowel control? N   Managing your Medications? N   Managing your Finances? N   Housekeeping or managing your Housekeeping? N     Patient Care Team: Glenn Lange, DO as PCP - General (Family Medicine) Arnoldo Lapping, MD as PCP - Cardiology (Cardiology) Glory Larsen, MD as Referring Physician (Dermatology) Sherwood Donath as Physician Assistant (Cardiology)  Indicate any recent Medical Services you may have received from other than [facility] providers in the past year (date may be approximate).     Assessment:    This is a routine wellness examination for [facility].  Hearing/Vision screen Hearing Screening - Comments:: Denies hearing difficulties.  Vision Screening - Comments:: Wears rx glasses - not up to date with routine eye exams. Cataracts removed 2017-2018    Goals Addressed               This Visit's Progress     Patient Stated (pt-stated)        06/11/2023: To get back out and start walking again.       Depression Screen     06/11/2023    3:10 PM 04/10/2023    2:53 PM 04/29/2022    8:39 AM 04/04/2022    9:01 AM 03/18/2022    9:27 AM 08/05/2021    9:00 AM 07/10/2021   10:52 AM  PHQ 2/9 Scores  PHQ - 2 Score 0 1  2 0 0 1  PHQ- 9 Score 0 2  3 1  0 2  Exception Documentation   Patient refusal        Fall Risk     04/10/2023    2:22 PM 06/05/2022    2:12 PM 04/04/2022   12:02 PM 03/18/2022    9:28 AM 08/05/2021    9:01 AM  Fall Risk   Falls in the past year? 0 1 0 1 0  Number falls in past yr: 0 0  0   Injury with Fall? 0 1  1   Risk for fall due to : No Fall Risks Other (Comment)     Follow up Falls evaluation completed Falls evaluation completed       MEDICARE RISK AT HOME:  Medicare Risk at Home Any stairs  in or around the home?: No If so, are there any without handrails?: No Home free of loose throw rugs in walkways, pet beds, electrical cords, etc?: Yes Adequate lighting in your home to reduce risk of falls?: Yes Life alert?: No Use of a cane, walker or w/c?: No Grab bars in the bathroom?: No Shower chair or bench in shower?: No Elevated toilet seat or a handicapped toilet?: No  TIMED UP AND GO:  Was the test performed?  No  Cognitive Function: 6CIT completed    06/11/2023    3:05 PM 12/29/2017    9:30 AM  MMSE - Mini Mental State Exam  Not completed: Unable to complete   Orientation to time  5  Orientation to Place  5  Registration  3  Attention/ Calculation  5  Recall  3  Language- name 2 objects  2  Language- repeat  1  Language- follow 3 step command  3  Language- read &  follow direction  1  Write a sentence  1  Copy design  1  Total score  30        06/11/2023    3:05 PM 06/05/2022    2:21 PM 08/17/2019    9:31 AM 12/29/2017    9:31 AM  6CIT Screen  What Year? 0 points 0 points 0 points 0 points  What month? 0 points 0 points 0 points 0 points  What time? 0 points 0 points 0 points 0 points  Count back from 20 0 points 0 points 0 points 0 points  Months in reverse 0 points 0 points 0 points 0 points  Repeat phrase 0 points 2 points 0 points 0 points  Total Score 0 points 2 points 0 points 0 points    Immunizations Immunization History  Administered Date(s) Administered   Fluad Quad(high Dose 65+) 10/25/2020   Fluad Trivalent(High Dose 65+) 03/11/2023   Influenza,inj,Quad PF,6+ Mos 10/30/2017   Influenza-Unspecified 11/03/2012, 11/04/2014, 11/03/2016   PFIZER(Purple Top)SARS-COV-2 Vaccination 04/03/2019, 05/03/2019   Pfizer Covid-19 Vaccine Bivalent Booster 2yrs & up 11/22/2020   Pfizer(Comirnaty)Fall Seasonal Vaccine 12 years and older 04/10/2023   Pneumococcal Conjugate-13 12/23/2016   Pneumococcal Polysaccharide-23 09/05/2010   Td 11/08/2003     Screening Tests Health Maintenance  Topic Date Due   Colonoscopy  08/09/2012   DTaP/Tdap/Td (2 - Tdap) 11/07/2013   Zoster Vaccines- Shingrix (1 of 2) 07/11/2023 (Originally 01/23/1960)   INFLUENZA VACCINE  09/04/2023   COVID-19 Vaccine (5 - Pfizer risk 2024-25 season) 10/11/2023   Medicare Annual Wellness (AWV)  06/10/2024   Pneumonia Vaccine 67+ Years old  Completed   DEXA SCAN  Completed   HPV VACCINES  Aged Out   Meningococcal B Vaccine  Aged Out   Hepatitis C Screening  Discontinued    Health Maintenance  Health Maintenance Due  Topic Date Due   Colonoscopy  08/09/2012   DTaP/Tdap/Td (2 - Tdap) 11/07/2013   Health Maintenance Items Addressed: Yes Patient is due for Dtap vaccine. Colonoscopy has been postponed by PCP due to age.   Additional Screening:  Vision Screening: Recommended annual ophthalmology exams for early detection of glaucoma and other disorders of the eye.  Dental Screening: Recommended annual dental exams for proper oral hygiene  Community Resource Referral / Chronic Care Management: CRR required this visit?  No   CCM required this visit?  No   Plan:    I have personally reviewed and noted the following in the patient's chart:   Medical and social history Use of alcohol, tobacco or illicit drugs  Current medications and supplements including opioid prescriptions. Patient is not currently taking opioid prescriptions. Functional ability and status Nutritional status Physical activity Advanced directives List of other physicians Hospitalizations, surgeries, and ER visits in previous 12 months Vitals Screenings to include cognitive, depression, and falls Referrals and appointments  In addition, I have reviewed and discussed with patient certain preventive protocols, quality metrics, and best practice recommendations. A written personalized care plan for preventive services as well as general preventive health recommendations were provided  to patient.   Margette Sheldon, LPN   4/0/9811   After Visit Summary: (MyChart) Due to this being a telephonic visit, the after visit summary with patients personalized plan was offered to patient via MyChart   Nurse Notes: Patient is due for Dtap vaccine. Colonoscopy has been postponed by PCP due to age.

## 2023-06-22 NOTE — Progress Notes (Signed)
 Cardiology Office Note:    Date:  06/23/2023  ID:  Melissa Gibbs, Melissa Gibbs 10-Sep-1940, MRN [id number] PCP: Glenn Lange, DO  [facility] Providers Cardiologist:  Arnoldo Lapping, MD Cardiology APP:  Gabino Joe, PA-C       Patient Profile:        Coronary artery disease Inferoposterior STEMI 10/19: 4 x 15 mm DES to the LCx 11/20/17; staged 2.75 x 18 mm DES to the LAD 11/23/17 LHC 11/20/17: pLAD 80, mLAD 80, oLCx 100, pRCA 60, EF 45 Complicated by atrial fibrillation LHC 03/10/23: pRCA 70 (RFR 0.92), mRCA 40; pLAD stent patent, mLAD 80 (small caliber), oLCx stent patent >> Med Rx.  Ischemic cardiomyopathy w return of normal LVF EF 35-40 in 11/2017 TTE 04/01/18: EF 60-65, mild LVH, grade 1 diastolic dysfunction, mild aortic valve sclerosis, trivial AI  Hypertension Hyperlipidemia Peripheral Arterial Disease               Discussed the use of AI scribe software for clinical note transcription with the patient, who gave verbal consent to proceed.  History of Present Illness Melissa Gibbs is a 83 y.o. female who returns for follow up of CAD. She was admitted in 03/2023 with unstable angina. Cardiac catheterization demonstrated patent stent in the LAD and LCx. There was severe stenosis in a small caliber mid LAD suitable for med Rx and nonobstructive disease in the pRCA by RFR. Med Rx was recommended. She was last seen 03/30/23 by Katlyn West, NP in clinic.   She is here alone.  Since last seen, she has been doing well.  She has not had recurrent chest discomfort.  She has not had shortness of breath, significant leg edema or syncope.  She hurt her back cleaning her gutters.  Pain is improving.    Review of Systems  Musculoskeletal:  Positive for back pain.  Gastrointestinal:  Negative for heartburn.  -See HPI      Studies Reviewed:        Results LABS LDL: 60 (09/15/2022)    Risk Assessment/Calculations:             Physical Exam:   VS:  BP 120/68   Pulse 72    Ht 5\' 2"  (1.575 m)   Wt 156 lb 3.2 oz (70.9 kg)   SpO2 97%   BMI 28.57 kg/m    Wt Readings from Last 3 Encounters:  06/23/23 156 lb 3.2 oz (70.9 kg)  06/11/23 155 lb (70.3 kg)  04/10/23 159 lb 3.2 oz (72.2 kg)    Constitutional:      Appearance: Healthy appearance. Not in distress.  Neck:     Vascular: JVD normal.  Pulmonary:     Breath sounds: Normal breath sounds. No wheezing. No rales.  Cardiovascular:     Normal rate. Regular rhythm.     Murmurs: There is no murmur.  Edema:    Peripheral edema absent.  Abdominal:     Palpations: Abdomen is soft.        Assessment and Plan:   Assessment & Plan Coronary artery disease involving native coronary artery of native heart with angina pectoris (HCC) Coronary artery disease is well-managed without recurrent angina. Cardiac catheterization in February 2025 showed patent LAD and LCX stents, with 80% stenosis in a mid LAD of small caliber and 70% stenosis in a proximal RCA, which was not hemodynamically significant. Medical therapy is appropriate and continued.  She has upcoming dental work pending.  We discussed that she  could remain on Plavix  without interruption unless she is having extensive work done.  She notes multiple teeth may need to be pulled. - Continue Lipitor  80 mg daily. - Continue Coreg  6.25 mg twice daily. - Continue Plavix  85 mg daily. - Prescribe nitroglycerin  as needed for angina. - If bleeding risk significant with upcoming dental work, okay to hold Plavix  for 5 days  Essential hypertension Blood pressure is well-controlled with current medication regimen. Home readings are consistently within normal range. - Continue Coreg  6.25 mg twice daily. - Continue Irbesartan  300 mg daily. Hyperlipidemia LDL goal <70 LDL was last checked in August 2024 and was at an optimal level of 60. Current medication regimen is effective. - Continue Lipitor  80 mg daily. Gastroesophageal reflux disease, unspecified whether esophagitis  present Intermittent episodes of acid reflux. Previously prescribed Protonix  but not currently taking it. Discussed the importance of taking Protonix  if a PPI is needed and to avoid medications like Prilosec and Nexium due to interactions with Plavix .       Dispo:  Return in about 1 year (around 06/22/2024) for Routine Follow Up, w/ Dr. Arlester Ladd, or Marlyse Single, PA-C.  Signed, Marlyse Single, PA-C

## 2023-06-23 ENCOUNTER — Encounter: Payer: Self-pay | Admitting: Physician Assistant

## 2023-06-23 ENCOUNTER — Ambulatory Visit: Payer: Medicare Other | Attending: Physician Assistant | Admitting: Physician Assistant

## 2023-06-23 VITALS — BP 120/68 | HR 72 | Ht 62.0 in | Wt 156.2 lb

## 2023-06-23 DIAGNOSIS — I10 Essential (primary) hypertension: Secondary | ICD-10-CM | POA: Diagnosis not present

## 2023-06-23 DIAGNOSIS — E785 Hyperlipidemia, unspecified: Secondary | ICD-10-CM

## 2023-06-23 DIAGNOSIS — I25119 Atherosclerotic heart disease of native coronary artery with unspecified angina pectoris: Secondary | ICD-10-CM

## 2023-06-23 DIAGNOSIS — K219 Gastro-esophageal reflux disease without esophagitis: Secondary | ICD-10-CM | POA: Diagnosis not present

## 2023-06-23 NOTE — Assessment & Plan Note (Signed)
 Blood pressure is well-controlled with current medication regimen. Home readings are consistently within normal range. - Continue Coreg  6.25 mg twice daily. - Continue Irbesartan  300 mg daily.

## 2023-06-23 NOTE — Assessment & Plan Note (Signed)
 Coronary artery disease is well-managed without recurrent angina. Cardiac catheterization in February 2025 showed patent LAD and LCX stents, with 80% stenosis in a mid LAD of small caliber and 70% stenosis in a proximal RCA, which was not hemodynamically significant. Medical therapy is appropriate and continued.  She has upcoming dental work pending.  We discussed that she could remain on Plavix  without interruption unless she is having extensive work done.  She notes multiple teeth may need to be pulled. - Continue Lipitor  80 mg daily. - Continue Coreg  6.25 mg twice daily. - Continue Plavix  85 mg daily. - Prescribe nitroglycerin  as needed for angina. - If bleeding risk significant with upcoming dental work, okay to hold Plavix  for 5 days

## 2023-06-23 NOTE — Patient Instructions (Signed)
 Medication Instructions:  Your physician recommends that you continue on your current medications as directed. Please refer to the Current Medication list given to you today.  *If you need a refill on your cardiac medications before your next appointment, please call your pharmacy*  Lab Work: None ordered  If you have labs (blood work) drawn today and your tests are completely normal, you will receive your results only by: MyChart Message (if you have MyChart) OR A paper copy in the mail If you have any lab test that is abnormal or we need to change your treatment, we will call you to review the results.  Testing/Procedures: None ordered  Follow-Up: At [facility], you and your health needs are our priority.  As part of our continuing mission to provide you with exceptional heart care, our providers are all part of one team.  This team includes your primary Cardiologist (physician) and Advanced Practice Providers or APPs (Physician Assistants and Nurse Practitioners) who all work together to provide you with the care you need, when you need it.  Your next appointment:   1 year(s)  Provider:   Arnoldo Lapping, MD or Marlyse Single, PA-C          We recommend signing up for the patient portal called "MyChart".  Sign up information is provided on this After Visit Summary.  MyChart is used to connect with patients for Virtual Visits (Telemedicine).  Patients are able to view lab/test results, encounter notes, upcoming appointments, etc.  Non-urgent messages can be sent to your provider as well.   To learn more about what you can do with MyChart, go to [email or web address].   Other Instructions If your dentist has to have you hold your Plavix , you may hold it 5 days prior.

## 2023-06-23 NOTE — Assessment & Plan Note (Signed)
 LDL was last checked in August 2024 and was at an optimal level of 60. Current medication regimen is effective. - Continue Lipitor  80 mg daily.

## 2023-06-30 ENCOUNTER — Other Ambulatory Visit (HOSPITAL_COMMUNITY): Payer: Self-pay

## 2023-06-30 ENCOUNTER — Ambulatory Visit: Payer: Medicare Other | Admitting: Obstetrics and Gynecology

## 2023-06-30 ENCOUNTER — Encounter: Payer: Self-pay | Admitting: Obstetrics and Gynecology

## 2023-06-30 VITALS — BP 148/85 | HR 58

## 2023-06-30 DIAGNOSIS — N812 Incomplete uterovaginal prolapse: Secondary | ICD-10-CM

## 2023-06-30 DIAGNOSIS — I10 Essential (primary) hypertension: Secondary | ICD-10-CM

## 2023-06-30 DIAGNOSIS — M545 Low back pain, unspecified: Secondary | ICD-10-CM | POA: Diagnosis not present

## 2023-06-30 DIAGNOSIS — N8110 Cystocele, unspecified: Secondary | ICD-10-CM

## 2023-06-30 MED ORDER — LIDOCAINE 5 % EX PTCH
1.0000 | MEDICATED_PATCH | CUTANEOUS | 0 refills | Status: DC
  Filled 2023-06-30 – 2023-07-01 (×2): qty 30, 30d supply, fill #0

## 2023-06-30 NOTE — Progress Notes (Signed)
 [facility]   Subjective:     Chief Complaint:  Chief Complaint  Patient presents with   Follow-up    Melissa Gibbs is a 83 y.o. female here today for pessary check.    History of Present Illness: Melissa Gibbs is a 83 y.o. female with stage II pelvic organ prolapse who presents for a pessary check. She is using a size 2 1/4in long stem gellhorn pessary. The pessary has been working well and she has no complaints. She is using vaginal estrogen but is still not consistent with using it. She denies vaginal bleeding.  Past Medical History: Patient  has a past medical history of ACS (acute coronary syndrome) (HCC) (11/20/2017), CAD (coronary artery disease) (11/20/2017), Cataract, Hyperlipidemia, Hypertension, Ischemic cardiomyopathy, Myocardial infarction [facility]), PAD (peripheral artery disease) (HCC), Paroxysmal atrial fibrillation in setting of acute MI, Shingles (2000), and Skin cancer.   Past Surgical History: She  has a past surgical history that includes Vaginal hysterectomy; Skin cancer excision (2014); Coronary/Graft Acute MI Revascularization (N/A, 11/20/2017); LEFT HEART CATH AND CORONARY ANGIOGRAPHY (N/A, 11/20/2017); CORONARY STENT INTERVENTION (N/A, 11/20/2017); CORONARY STENT INTERVENTION (N/A, 11/23/2017); Cataract extraction, bilateral; LEFT HEART CATH AND CORONARY ANGIOGRAPHY (N/A, 03/10/2023); and CORONARY PRESSURE/FFR STUDY (N/A, 03/10/2023).   Medications: She has a current medication list which includes the following prescription(s): alendronate , atorvastatin , carvedilol , cholecalciferol , clopidogrel , estradiol , irbesartan , lidocaine , and nitroglycerin .   Allergies: Patient is allergic to amoxicillin.   Social History: Patient  reports that she has quit smoking. Her smoking use included cigarettes. She has never used smokeless tobacco. She reports that she does not currently use alcohol. She reports that she does not use drugs.      Objective:     Physical  Exam: BP (!) 148/85 (Cuff Size: Normal)   Pulse (!) 58  Gen: No apparent distress, A&O x 3. Detailed Urogynecologic Evaluation:  Pelvic Exam: Normal external female genitalia; Bartholin's and Skene's glands normal in appearance; urethral meatus with caruncle, no urethral masses or discharge. The pessary was noted to be in place. It was removed and cleaned. Speculum exam revealed no lesions in the vagina. The pessary was replaced. It was comfortable to the patient and fit well.     Assessment/Plan:    Assessment: Melissa Gibbs is a 83 y.o. with stage II pelvic organ prolapse here for a pessary check. She is doing well.  Plan: She will keep the pessary in place until next visit. She will continue to use estrogen. She will follow-up in 3 months for a pessary check or sooner as needed.   Of note, patient has right sided back pain/irritation after doing yard work and cleaning her gutters. Encouraged her to use lidocaine  patches to the area and if the symptoms persist to seek evaluation at PCP or Urgent care. Patient denies fall or other accident that would lead me to think there was bone involvement.   All questions were answered.

## 2023-07-01 ENCOUNTER — Other Ambulatory Visit (HOSPITAL_COMMUNITY): Payer: Self-pay

## 2023-07-14 ENCOUNTER — Encounter: Payer: Self-pay | Admitting: *Deleted

## 2023-08-12 ENCOUNTER — Other Ambulatory Visit: Payer: Self-pay

## 2023-08-14 ENCOUNTER — Other Ambulatory Visit: Payer: Self-pay

## 2023-08-15 MED ORDER — ALENDRONATE SODIUM 10 MG PO TABS: 10.0000 mg | ORAL_TABLET | Freq: Every day | ORAL | 2 refills | Status: DC

## 2023-08-15 NOTE — Telephone Encounter (Signed)
 Will refill for now. Would prefer patient be seen for annual visit.  Admin team: Can you get her scheduled for this?

## 2023-09-17 ENCOUNTER — Ambulatory Visit (INDEPENDENT_AMBULATORY_CARE_PROVIDER_SITE_OTHER)

## 2023-09-17 VITALS — BP 148/90 | HR 70 | Ht 62.0 in | Wt 153.0 lb

## 2023-09-17 DIAGNOSIS — D508 Other iron deficiency anemias: Secondary | ICD-10-CM | POA: Diagnosis not present

## 2023-09-17 DIAGNOSIS — F419 Anxiety disorder, unspecified: Secondary | ICD-10-CM | POA: Diagnosis not present

## 2023-09-17 DIAGNOSIS — I10 Essential (primary) hypertension: Secondary | ICD-10-CM | POA: Diagnosis not present

## 2023-09-17 DIAGNOSIS — E785 Hyperlipidemia, unspecified: Secondary | ICD-10-CM | POA: Diagnosis not present

## 2023-09-17 MED ORDER — IRBESARTAN 150 MG PO TABS: 150.0000 mg | ORAL_TABLET | Freq: Every day | ORAL | 3 refills | Status: AC

## 2023-09-17 NOTE — Assessment & Plan Note (Signed)
 Initial BP 156/94, repeat 149/90 which is likely 2/2 anxiety. Home BP readings show systolic in range and somewhat low diastolic readings which may be contributing to dizziness. Will decrease irbesartan  for now. - Decrease Irbesartan  from 300mg  to 150mg  daily

## 2023-09-17 NOTE — Patient Instructions (Signed)
 Thank you for visiting the clinic today, it was good to see you!  Please always bring your medication bottles  In today's visit we discussed:  - Decrease irbesartan  to 150 mg daily - We will get bloodwork today including your LDL cholesterol, ferritin, and CBC - Please reach out to a therapist for resources to work on anxiety   For any questions, please call the office at [phone] or send me a message in MyChart. Have a great day!  Tyri Elmore, DO [facility] Resident, PGY-1   For information on therapists, please go to [email or web address]. You can also contact your insurance company to find an in-network therapist.   Therapy and Counseling Resources Most providers on this list will take Medicaid. Patients with commercial insurance or Medicare should contact their insurance company to get a list of in network providers.  [facility] (takes children) Location 1: [address] [place], [place] [address] Location 2: [address] [place], [place] [address] [phone]   [facility] (spanish speaking therapist available)(habla espanol)(take medicare and medicaid)  [address], [place], [place] [address], [place]  [email or web address] [phone]  [facility]ing [address] [place] [place], [place] [address] [phone]  [facility]   [address], [address] [place], [place] [address]      [phone]  [facility] (spanish available) [address]  [place], [place] [address] [phone]  [facility] (take [facility] and medicare) [address]  [place], [place] [address]       [phone]     [facility] (virtual only) [phone]  Janit Griffins [facility] [address], [place], [place] [phone]  [facility]:  [address] [place] [phone]  [facility]:  [address]  [address] [place] [phone]  [facility] (under & uninsured) [address]   [place] [phone]    [email or web address]    [facility] [address]  [place]    [phone]  [facility] -[address]     Phone:  [phone]     [facility]  [address]  [phone]   [facility] [address]      [place], [place] [phone]  [facility]: [address] [place], [place], [place]  [phone]   [facility] (Spanish therapist) [email or web address]  [address] [place]  [address]   [place] [address]    [phone]    [facility]   [address],  [place], [place]  [phone]   [facility]  [address] [place]  [phone]  [facility]  [address] [place], [place]        [phone]  Open Access/Walk In Clinic under & uninsured  [facility]  [address] [place], [place] [phone] Crisis [phone]  [facility] [address],  (Spanish)   [address] , [place]: [phone]) 8:30 - 12; 1 - 2:30  [facility] [employer],  [address] [place], [place]    [phone]):8:30 - 12; 2 - 3PM  [facility] [employer],  [address],  [place]; [phone]):   Mon - Fri 8 AM - 5 PM  [facility] [address] [place]  MWF 12:30 to 3:00 or call to schedule an appointment  [phone]  Specific Provider options Psychology Today  [email or web address]/us  click on find a therapist  enter your zip code left side and select or tailor a therapist for your specific need.   [facility] Provider Directory [email or web address]  (Medicaid)   Follow all drop down to find a provider  Social Support  program Mental Health [facility] [phone] or PhotoSolver.pl [address], [place], [place] Recovery support and educational   24- Hour Availability:   [facility]  [address] [place], [place] [phone] Crisis [phone]  [facility] [phone]  [facility]  [phone]   [facility]  [phone] (after hours)  Therapeutic Alternative/Mobile Crisis   [phone]  [place]  National Suicide Hotline  502-770-6982 MERRILY[place])  Call 911 or go to emergency room  [facility] [place]  [phone]);  [place] and [employer]  [phone]); [place], [place], [place], [place], [place], [place], [place]

## 2023-09-17 NOTE — Progress Notes (Signed)
    SUBJECTIVE:   CHIEF COMPLAINT / HPI:   HTN: Currently taking irbesartan  300mg  and carvedilol  6.25mg . Pt has brought home blood pressure log with readings ranging from 90-130 systolic and 60-80 diastolic. States she feels dizzy at times when standing up and would like to discuss reducing medication dose. Does feel anxious being at the office today but otherwise feeling well.  GERD: Pt states her GERD is well-controlled using dill pickle juice nightly and she would like to stop Protonix  as she does not believe it is needed.  Anxiety: Pt endorses anxiety surrounding her daughter and general life circumstances. States her most of her finances go to her daughter and that she feels stuck at home since daughter is using her car. Patient is not interested in starting a medication at this time.  PERTINENT  PMH / PSH: Hx MI s/p stents, HTN  OBJECTIVE:   BP (!) 148/90   Pulse 70   Ht 5' 2 (1.575 m)   Wt 153 lb (69.4 kg)   SpO2 99%   BMI 27.98 kg/m    General: Alert, well-appearing female.  HEENT: Normocephalic, atraumatic. EOMI. Neck: Supple, normal ROM. Cardiovascular: RRR, S1/S2 normal. No murmurs, rubs, or gallops appreciated. Pulmonary: Normal work of breathing. CTAB with no wheezes or crackles present Extremities: Warm and well-perfused, without cyanosis or edema. Full ROM Neurologic: No focal deficits Psych: Slightly anxious  ASSESSMENT/PLAN:   Assessment & Plan Essential hypertension Initial BP 156/94, repeat 149/90 which is likely 2/2 anxiety. Home BP readings show systolic in range and somewhat low diastolic readings which may be contributing to dizziness. Will decrease irbesartan  for now. - Decrease Irbesartan  from 300mg  to 150mg  daily Other iron deficiency anemia CBC from 03/10/23 with Hb 11.4. - Order ferritin and CBC to evaluate for anemia Hyperlipidemia, unspecified hyperlipidemia type Patient is doing well on atrovastatin 80mg . Last LDL cholesterol 60 on 09/15/22. -  Order LDL cholesterol Anxiety Patient would like to avoid medication for now but is open to exploring therapy as a treatment option.  - Provided list of therapists that take Medicare and encouraged patient to find a provider  Darren Jernigan, DO [facility]

## 2023-09-17 NOTE — Assessment & Plan Note (Signed)
 Patient would like to avoid medication for now but is open to exploring therapy as a treatment option.  - Provided list of therapists that take Medicare and encouraged patient to find a provider

## 2023-09-18 LAB — CBC
Hematocrit: 39.6 % (ref 34.0–46.6)
Hemoglobin: 12.8 g/dL (ref 11.1–15.9)
MCH: 29.6 pg (ref 26.6–33.0)
MCHC: 32.3 g/dL (ref 31.5–35.7)
MCV: 92 fL (ref 79–97)
Platelets: 295 x10E3/uL (ref 150–450)
RBC: 4.33 x10E6/uL (ref 3.77–5.28)
RDW: 12.7 % (ref 11.7–15.4)
WBC: 8.4 x10E3/uL (ref 3.4–10.8)

## 2023-09-18 LAB — LDL CHOLESTEROL, DIRECT: LDL Direct: 73 mg/dL (ref 0–99)

## 2023-09-18 LAB — FERRITIN: Ferritin: 27 ng/mL (ref 15–150)

## 2023-09-19 ENCOUNTER — Ambulatory Visit: Payer: Self-pay

## 2023-10-01 ENCOUNTER — Encounter: Payer: Self-pay | Admitting: Obstetrics and Gynecology

## 2023-10-01 ENCOUNTER — Ambulatory Visit: Admitting: Obstetrics and Gynecology

## 2023-10-01 VITALS — BP 128/74

## 2023-10-01 DIAGNOSIS — N812 Incomplete uterovaginal prolapse: Secondary | ICD-10-CM | POA: Diagnosis not present

## 2023-10-01 DIAGNOSIS — N8110 Cystocele, unspecified: Secondary | ICD-10-CM

## 2023-10-01 DIAGNOSIS — Z960 Presence of urogenital implants: Secondary | ICD-10-CM

## 2023-10-01 NOTE — Progress Notes (Signed)
 [facility]   Subjective:     Chief Complaint:  Chief Complaint  Patient presents with   Pessary Check    Does have to push pessary back up - not often though - it doesn't fall when pushes with BM -doesn't use estrogen cream as often as should - ist rying to think of ways for her to remind herself to use   History of Present Illness: LOLA CZERWONKA is a 83 y.o. female with stage II pelvic organ prolapse who presents for a pessary check. She is using a size 2 1/4in long stem gellhorn pessary. The pessary has been working well and she reports she does sometimes have to push the pessary back up when it falls down some. She is using vaginal estrogen, but is not consistently as she reports she should. She denies vaginal bleeding. Denies feeling that the pessary is too small.   Past Medical History: Patient  has a past medical history of ACS (acute coronary syndrome) (HCC) (11/20/2017), CAD (coronary artery disease) (11/20/2017), Cataract, Hyperlipidemia, Hypertension, Ischemic cardiomyopathy, Myocardial infarction [facility]), PAD (peripheral artery disease) (HCC), Paroxysmal atrial fibrillation in setting of acute MI, Shingles (2000), and Skin cancer.   Past Surgical History: She  has a past surgical history that includes Vaginal hysterectomy; Skin cancer excision (2014); Coronary/Graft Acute MI Revascularization (N/A, 11/20/2017); LEFT HEART CATH AND CORONARY ANGIOGRAPHY (N/A, 11/20/2017); CORONARY STENT INTERVENTION (N/A, 11/20/2017); CORONARY STENT INTERVENTION (N/A, 11/23/2017); Cataract extraction, bilateral; LEFT HEART CATH AND CORONARY ANGIOGRAPHY (N/A, 03/10/2023); and CORONARY PRESSURE/FFR STUDY (N/A, 03/10/2023).   Medications: She has a current medication list which includes the following prescription(s): alendronate , atorvastatin , carvedilol , cholecalciferol , clopidogrel , estradiol , irbesartan , and nitroglycerin .   Allergies: Patient is allergic to amoxicillin.   Social  History: Patient  reports that she has quit smoking. Her smoking use included cigarettes. She has never used smokeless tobacco. She reports that she does not currently use alcohol. She reports that she does not use drugs.      Objective:    Physical Exam: BP 128/74 (BP Location: Left Arm, Patient Position: Sitting, Cuff Size: Normal)  Gen: No apparent distress, A&O x 3. Detailed Urogynecologic Evaluation:  Pelvic Exam: Normal external female genitalia; Bartholin's and Skene's glands normal in appearance; urethral meatus normal in appearance, no urethral masses or discharge. The pessary was noted to be in place. It was removed and cleaned. Speculum exam revealed no lesions in the vagina. The pessary was replaced. It was comfortable to the patient and fit well.    Assessment/Plan:    Assessment: Ms. Goonan is a 83 y.o. with stage II pelvic organ prolapse here for a pessary check. She is doing well.  Plan: She will keep the pessary in place until next visit. She will continue to use estrogen. She will follow-up in 3 months for a pessary check or sooner as needed.   Patient reports she is doing okay with the pessary for now and will let us  know if she wanted to be re-fit. She reports if she were to desire surgery it would be to do a vaginal repair, but her overall goal is to really defer surgery at her age. We discussed that we can continue the pessary as long as she would like.   All questions were answered.

## 2023-10-07 ENCOUNTER — Other Ambulatory Visit: Payer: Self-pay | Admitting: Obstetrics and Gynecology

## 2023-10-12 ENCOUNTER — Other Ambulatory Visit: Payer: Self-pay | Admitting: Obstetrics and Gynecology

## 2023-10-12 ENCOUNTER — Other Ambulatory Visit (HOSPITAL_COMMUNITY): Payer: Self-pay

## 2023-10-12 MED ORDER — ESTRADIOL 0.1 MG/GM VA CREA
TOPICAL_CREAM | VAGINAL | 3 refills | Status: AC
  Filled 2023-10-12: qty 42.5, 30d supply, fill #0

## 2023-10-28 DIAGNOSIS — L720 Epidermal cyst: Secondary | ICD-10-CM | POA: Diagnosis not present

## 2023-10-28 DIAGNOSIS — L814 Other melanin hyperpigmentation: Secondary | ICD-10-CM | POA: Diagnosis not present

## 2023-10-28 DIAGNOSIS — R208 Other disturbances of skin sensation: Secondary | ICD-10-CM | POA: Diagnosis not present

## 2023-10-28 DIAGNOSIS — D1801 Hemangioma of skin and subcutaneous tissue: Secondary | ICD-10-CM | POA: Diagnosis not present

## 2023-10-28 DIAGNOSIS — L570 Actinic keratosis: Secondary | ICD-10-CM | POA: Diagnosis not present

## 2023-10-28 DIAGNOSIS — L853 Xerosis cutis: Secondary | ICD-10-CM | POA: Diagnosis not present

## 2023-10-28 DIAGNOSIS — L821 Other seborrheic keratosis: Secondary | ICD-10-CM | POA: Diagnosis not present

## 2023-10-28 DIAGNOSIS — Z85828 Personal history of other malignant neoplasm of skin: Secondary | ICD-10-CM | POA: Diagnosis not present

## 2023-11-17 ENCOUNTER — Other Ambulatory Visit: Payer: Self-pay | Admitting: Cardiovascular Disease

## 2023-11-22 ENCOUNTER — Other Ambulatory Visit: Payer: Self-pay

## 2023-11-29 ENCOUNTER — Other Ambulatory Visit: Payer: Self-pay | Admitting: Physician Assistant

## 2024-01-04 ENCOUNTER — Encounter: Payer: Self-pay | Admitting: Obstetrics and Gynecology

## 2024-01-04 ENCOUNTER — Ambulatory Visit: Admitting: Obstetrics and Gynecology

## 2024-01-04 VITALS — BP 178/97 | HR 85

## 2024-01-04 DIAGNOSIS — N8110 Cystocele, unspecified: Secondary | ICD-10-CM

## 2024-01-04 DIAGNOSIS — N812 Incomplete uterovaginal prolapse: Secondary | ICD-10-CM | POA: Diagnosis not present

## 2024-01-04 DIAGNOSIS — I10 Essential (primary) hypertension: Secondary | ICD-10-CM

## 2024-01-04 NOTE — Patient Instructions (Addendum)
 Please call if you have any issues with the pessary. Bleeding, pain or irritation.   Use your estrogen cream twice a week.

## 2024-01-04 NOTE — Progress Notes (Signed)
 [facility]   Subjective:     Chief Complaint:  Chief Complaint  Patient presents with   Pessary Check    TERRIONNA BRIDWELL is a 83 y.o. female is here for pessary check.   History of Present Illness: RIVER MCKERCHER is a 83 y.o. female with stage II pelvic organ prolapse who presents for a pessary check. She is using a size 2 1/4in long stem gellhorn pessary. The pessary has been working well and she reports she does sometimes have to push the pessary back up when it falls down some and that is has been more frequent. She is using vaginal estrogen, but is not consistently as she reports she should. She denies vaginal bleeding.   Past Medical History: Patient  has a past medical history of ACS (acute coronary syndrome) (HCC) (11/20/2017), CAD (coronary artery disease) (11/20/2017), Cataract, Hyperlipidemia, Hypertension, Ischemic cardiomyopathy, Myocardial infarction [facility]), PAD (peripheral artery disease), Paroxysmal atrial fibrillation in setting of acute MI, Shingles (2000), and Skin cancer.   Past Surgical History: She  has a past surgical history that includes Vaginal hysterectomy; Skin cancer excision (2014); Coronary/Graft Acute MI Revascularization (N/A, 11/20/2017); LEFT HEART CATH AND CORONARY ANGIOGRAPHY (N/A, 11/20/2017); CORONARY STENT INTERVENTION (N/A, 11/20/2017); CORONARY STENT INTERVENTION (N/A, 11/23/2017); Cataract extraction, bilateral; LEFT HEART CATH AND CORONARY ANGIOGRAPHY (N/A, 03/10/2023); and CORONARY PRESSURE/FFR STUDY (N/A, 03/10/2023).   Medications: She has a current medication list which includes the following prescription(s): alendronate , atorvastatin , carvedilol , cholecalciferol , clopidogrel , estradiol , irbesartan , and nitroglycerin .   Allergies: Patient is allergic to amoxicillin.   Social History: Patient  reports that she has quit smoking. Her smoking use included cigarettes. She has never used smokeless tobacco. She reports that she does not  currently use alcohol. She reports that she does not use drugs.      Objective:    Physical Exam: BP (!) 178/97   Pulse 85  Gen: No apparent distress, A&O x 3. Detailed Urogynecologic Evaluation:  Pelvic Exam: Normal external female genitalia; Bartholin's and Skene's glands normal in appearance; urethral meatus normal in appearance, no urethral masses or discharge. The pessary was noted to be in place. It was removed and cleaned. Speculum exam revealed no lesions in the vagina. The pessary was changed to a #6 Long stem Gellhorn (Lot [id number]) to support patient better. It was comfortable to the patient and fit well.    Assessment/Plan:    Assessment: Ms. Arave is a 83 y.o. with stage II pelvic organ prolapse here for a pessary check. She is doing well.  Plan: She will keep the pessary in place until next visit. She will continue to use estrogen. She will follow-up in 3 months for a pessary check or sooner as needed.    All questions were answered.

## 2024-02-22 ENCOUNTER — Telehealth: Payer: Self-pay | Admitting: Pharmacist

## 2024-02-22 NOTE — Telephone Encounter (Signed)
 Patient contacted for follow-up questionable fill history and need for clarification of medication adherence.   Patient reports she is well stocked from previous [facility] prescriptions.  States that she will call when in need of refill.    Total time with patient call and documentation of interaction: 9 minutes.   This patient is appearing on a report for being at risk of failing the adherence measure for cholesterol (statin) medications this calendar year.   Medication: atorvastatin  Last fill date: 09/21/2023 for #90 day supply  Insurance report was not up to date. No action needed at this time.  and Reviewed medication indication, dosing, and goals of therapy.

## 2024-03-11 ENCOUNTER — Telehealth: Payer: Self-pay

## 2024-03-11 NOTE — Telephone Encounter (Signed)
 Patient called stating that her insurance is requiring new referrals for her specialists she see. Ask if they can be sent to [facility] at [facility] please

## 2024-04-05 ENCOUNTER — Ambulatory Visit: Admitting: Obstetrics and Gynecology

## 2024-06-16 ENCOUNTER — Encounter
